# Patient Record
Sex: Female | Born: 1960 | Race: White | Hispanic: No | Marital: Married | Smoking: Current every day smoker
Health system: Southern US, Community
[De-identification: ages and names within clinical notes are randomized; demographics above are authoritative.]

## PROBLEM LIST (undated history)

## (undated) DIAGNOSIS — I639 Cerebral infarction, unspecified: Secondary | ICD-10-CM

## (undated) DIAGNOSIS — N200 Calculus of kidney: Secondary | ICD-10-CM

---

## 2001-08-31 HISTORY — PX: ABDOMINAL HYSTERECTOMY: SHX81

## 2005-12-06 ENCOUNTER — Emergency Department: Payer: Self-pay | Admitting: Emergency Medicine

## 2009-05-01 ENCOUNTER — Inpatient Hospital Stay: Payer: Self-pay | Admitting: *Deleted

## 2009-05-15 ENCOUNTER — Encounter: Payer: Self-pay | Admitting: Family Medicine

## 2009-05-31 ENCOUNTER — Encounter: Payer: Self-pay | Admitting: Family Medicine

## 2009-06-05 ENCOUNTER — Ambulatory Visit: Payer: Self-pay | Admitting: Neurology

## 2009-06-18 ENCOUNTER — Ambulatory Visit: Payer: Self-pay | Admitting: Neurology

## 2009-07-01 ENCOUNTER — Encounter: Payer: Self-pay | Admitting: Family Medicine

## 2009-07-05 ENCOUNTER — Emergency Department: Payer: Self-pay | Admitting: Internal Medicine

## 2011-09-30 LAB — TSH: TSH: 1.43 u[IU]/mL (ref <=5.90)

## 2012-04-19 LAB — BASIC METABOLIC PANEL
BUN: 20 mg/dL (ref 4–21)
CREATININE: 0.9 mg/dL (ref 0.5–1.1)
Glucose: 91 mg/dL
POTASSIUM: 4.4 mmol/L (ref 3.4–5.3)
Sodium: 142 mmol/L (ref 137–147)

## 2012-04-19 LAB — HEPATIC FUNCTION PANEL
ALT: 10 U/L (ref 7–35)
AST: 12 U/L — AB (ref 13–35)

## 2012-04-19 LAB — LIPID PANEL
CHOLESTEROL: 209 mg/dL — AB (ref 0–200)
HDL: 56 mg/dL (ref 35–70)
LDL Cholesterol: 137 mg/dL
Triglycerides: 80 mg/dL (ref 40–160)

## 2012-04-19 LAB — HEMOGLOBIN A1C: HEMOGLOBIN A1C: 5.8 % (ref 4.0–6.0)

## 2013-01-20 ENCOUNTER — Ambulatory Visit: Payer: Self-pay | Admitting: Family Medicine

## 2013-10-27 ENCOUNTER — Ambulatory Visit: Payer: Self-pay | Admitting: Family Medicine

## 2013-12-04 ENCOUNTER — Ambulatory Visit: Payer: Self-pay | Admitting: Urology

## 2015-02-08 DIAGNOSIS — Z87442 Personal history of urinary calculi: Secondary | ICD-10-CM | POA: Insufficient documentation

## 2015-02-08 DIAGNOSIS — I10 Essential (primary) hypertension: Secondary | ICD-10-CM | POA: Insufficient documentation

## 2015-02-08 DIAGNOSIS — I6930 Unspecified sequelae of cerebral infarction: Secondary | ICD-10-CM | POA: Insufficient documentation

## 2015-02-08 DIAGNOSIS — F329 Major depressive disorder, single episode, unspecified: Secondary | ICD-10-CM | POA: Insufficient documentation

## 2015-02-08 DIAGNOSIS — F32A Depression, unspecified: Secondary | ICD-10-CM | POA: Insufficient documentation

## 2015-02-08 DIAGNOSIS — J309 Allergic rhinitis, unspecified: Secondary | ICD-10-CM | POA: Insufficient documentation

## 2015-02-08 DIAGNOSIS — Q828 Other specified congenital malformations of skin: Secondary | ICD-10-CM | POA: Insufficient documentation

## 2015-02-08 DIAGNOSIS — M549 Dorsalgia, unspecified: Secondary | ICD-10-CM | POA: Insufficient documentation

## 2015-02-08 DIAGNOSIS — E7800 Pure hypercholesterolemia, unspecified: Secondary | ICD-10-CM | POA: Insufficient documentation

## 2015-02-08 DIAGNOSIS — N200 Calculus of kidney: Secondary | ICD-10-CM | POA: Insufficient documentation

## 2015-02-08 DIAGNOSIS — Z8669 Personal history of other diseases of the nervous system and sense organs: Secondary | ICD-10-CM | POA: Insufficient documentation

## 2015-02-08 DIAGNOSIS — G40909 Epilepsy, unspecified, not intractable, without status epilepticus: Secondary | ICD-10-CM | POA: Insufficient documentation

## 2015-02-08 DIAGNOSIS — Z8639 Personal history of other endocrine, nutritional and metabolic disease: Secondary | ICD-10-CM | POA: Insufficient documentation

## 2015-02-08 DIAGNOSIS — G4726 Circadian rhythm sleep disorder, shift work type: Secondary | ICD-10-CM | POA: Insufficient documentation

## 2015-02-08 DIAGNOSIS — Z720 Tobacco use: Secondary | ICD-10-CM | POA: Insufficient documentation

## 2015-02-11 ENCOUNTER — Encounter: Payer: Self-pay | Admitting: Emergency Medicine

## 2015-02-11 ENCOUNTER — Emergency Department
Admission: EM | Admit: 2015-02-11 | Discharge: 2015-02-11 | Disposition: A | Discharge status: Home / Self Care | Payer: PRIVATE HEALTH INSURANCE | Attending: Emergency Medicine | Admitting: Emergency Medicine

## 2015-02-11 ENCOUNTER — Other Ambulatory Visit: Payer: Self-pay

## 2015-02-11 ENCOUNTER — Telehealth: Payer: Self-pay | Admitting: Family Medicine

## 2015-02-11 ENCOUNTER — Emergency Department: Payer: PRIVATE HEALTH INSURANCE

## 2015-02-11 ENCOUNTER — Encounter: Payer: Self-pay | Admitting: Family Medicine

## 2015-02-11 ENCOUNTER — Ambulatory Visit (INDEPENDENT_AMBULATORY_CARE_PROVIDER_SITE_OTHER): Payer: PRIVATE HEALTH INSURANCE | Admitting: Family Medicine

## 2015-02-11 VITALS — BP 108/80 | HR 73 | Temp 98.4°F | Resp 16 | Ht 67.0 in | Wt 160.0 lb

## 2015-02-11 DIAGNOSIS — R059 Cough, unspecified: Secondary | ICD-10-CM

## 2015-02-11 DIAGNOSIS — E119 Type 2 diabetes mellitus without complications: Secondary | ICD-10-CM | POA: Insufficient documentation

## 2015-02-11 DIAGNOSIS — Z720 Tobacco use: Secondary | ICD-10-CM | POA: Insufficient documentation

## 2015-02-11 DIAGNOSIS — R05 Cough: Secondary | ICD-10-CM | POA: Diagnosis not present

## 2015-02-11 DIAGNOSIS — J189 Pneumonia, unspecified organism: Secondary | ICD-10-CM | POA: Diagnosis not present

## 2015-02-11 DIAGNOSIS — E860 Dehydration: Secondary | ICD-10-CM

## 2015-02-11 DIAGNOSIS — Z79899 Other long term (current) drug therapy: Secondary | ICD-10-CM | POA: Insufficient documentation

## 2015-02-11 DIAGNOSIS — M6281 Muscle weakness (generalized): Secondary | ICD-10-CM | POA: Insufficient documentation

## 2015-02-11 DIAGNOSIS — N390 Urinary tract infection, site not specified: Secondary | ICD-10-CM | POA: Insufficient documentation

## 2015-02-11 DIAGNOSIS — R1084 Generalized abdominal pain: Secondary | ICD-10-CM | POA: Diagnosis not present

## 2015-02-11 DIAGNOSIS — R079 Chest pain, unspecified: Secondary | ICD-10-CM | POA: Diagnosis present

## 2015-02-11 DIAGNOSIS — R509 Fever, unspecified: Secondary | ICD-10-CM

## 2015-02-11 DIAGNOSIS — Z791 Long term (current) use of non-steroidal anti-inflammatories (NSAID): Secondary | ICD-10-CM | POA: Diagnosis not present

## 2015-02-11 DIAGNOSIS — I10 Essential (primary) hypertension: Secondary | ICD-10-CM | POA: Insufficient documentation

## 2015-02-11 LAB — URINALYSIS COMPLETE WITH MICROSCOPIC (ARMC ONLY)
Bilirubin Urine: NEGATIVE
Glucose, UA: NEGATIVE mg/dL
HGB URINE DIPSTICK: NEGATIVE
NITRITE: NEGATIVE
PROTEIN: 30 mg/dL — AB
SPECIFIC GRAVITY, URINE: 1.038 — AB (ref 1.005–1.030)
pH: 5 (ref 5.0–8.0)

## 2015-02-11 LAB — BASIC METABOLIC PANEL
Anion gap: 9 (ref 5–15)
BUN: 21 mg/dL — ABNORMAL HIGH (ref 6–20)
CHLORIDE: 102 mmol/L (ref 101–111)
CO2: 29 mmol/L (ref 22–32)
Calcium: 9.4 mg/dL (ref 8.9–10.3)
Creatinine, Ser: 0.93 mg/dL (ref 0.44–1.00)
GFR calc Af Amer: >60 mL/min (ref >60)
GFR calc non Af Amer: >60 mL/min (ref >60)
Glucose, Bld: 115 mg/dL — ABNORMAL HIGH (ref 65–99)
Potassium: 3.8 mmol/L (ref 3.5–5.1)
SODIUM: 140 mmol/L (ref 135–145)

## 2015-02-11 LAB — POCT URINALYSIS DIPSTICK
Blood, UA: NEGATIVE
GLUCOSE UA: NEGATIVE
Nitrite, UA: NEGATIVE
Urobilinogen, UA: 0.2
pH, UA: 6

## 2015-02-11 LAB — CBC
HCT: 40.9 % (ref 35.0–47.0)
Hemoglobin: 13.5 g/dL (ref 12.0–16.0)
MCH: 34.2 pg — ABNORMAL HIGH (ref 26.0–34.0)
MCHC: 33 g/dL (ref 32.0–36.0)
MCV: 103.7 fL — ABNORMAL HIGH (ref 80.0–100.0)
PLATELETS: 203 10*3/uL (ref 150–440)
RBC: 3.94 MIL/uL (ref 3.80–5.20)
RDW: 12.9 % (ref 11.5–14.5)
WBC: 8 10*3/uL (ref 3.6–11.0)

## 2015-02-11 LAB — TROPONIN I: Troponin I: <0.03 ng/mL (ref <0.031)

## 2015-02-11 MED ORDER — AZITHROMYCIN 250 MG PO TABS: ORAL_TABLET | ORAL | Status: DC

## 2015-02-11 MED ORDER — CEPHALEXIN 500 MG PO CAPS: 500.0000 mg | ORAL_CAPSULE | Freq: Two times a day (BID) | ORAL | Status: DC

## 2015-02-11 MED ORDER — SODIUM CHLORIDE 0.9 % IV BOLUS (SEPSIS)
1000.0000 mL | INTRAVENOUS | Status: AC
  Administered 2015-02-11: 1000 mL via INTRAVENOUS

## 2015-02-11 NOTE — Progress Notes (Signed)
Patient: Kaitlyn Hodges Female    DOB: 1961/05/29   54 y.o.   MRN: [id number] Visit Date: 02/11/2015  Today's Provider: Mila Merry, MD   Chief Complaint  Patient presents with  . Fever   Subjective:    Fever  This is a recurrent problem. The current episode started 1 to 4 weeks ago (2 1/2 weeks). The problem occurs intermittently. The problem has been unchanged. The maximum temperature noted was 103 to 103.9 F. The temperature was taken using an oral thermometer. Associated symptoms include abdominal pain, chest pain, congestion, coughing, headaches, muscle aches, nausea, sleepiness, urinary pain, vomiting and wheezing. Pertinent negatives include no diarrhea, ear pain, rash or sore throat. She has tried acetaminophen (Tylenol Arthritis 300 mg) for the symptoms. The treatment provided mild relief.   She report having fevers off and on for a few months,but having daily for the last couple of weeks. She has been increasingly fatigued and weak and sometimes acts confused and out of it. She states she feels better today than yesterday. Her husband states he has tried to get her to go to the ER on several occasions and even called EMS to house a few times, but she refused to go to hospital for evaluation.     Previous Medications   ARMODAFINIL (NUVIGIL) 150 MG TABLET    Take 1 tablet by mouth daily.   LAMOTRIGINE (LAMICTAL) 100 MG TABLET    Take 1 tablet by mouth 2 (two) times daily.   LEVETIRACETAM 750 MG TB24    Take 2 tablets by mouth daily.   MELOXICAM (MOBIC) 7.5 MG TABLET    Take 1 tablet by mouth 2 (two) times daily.   METHOCARBAMOL (ROBAXIN) 750 MG TABLET    Take 1-2 tablets by mouth every 6 (six) hours as needed.   OXYCODONE-ACETAMINOPHEN (PERCOCET) 7.5-325 MG PER TABLET    Take 1 tablet by mouth every 6 (six) hours as needed.    Review of Systems  Constitutional: Positive for fever, chills and fatigue. Negative for activity change and appetite change.  HENT: Positive for congestion.  Negative for ear discharge, ear pain and sore throat.   Respiratory: Positive for cough, shortness of breath and wheezing.   Cardiovascular: Positive for chest pain.  Gastrointestinal: Positive for nausea, vomiting and abdominal pain. Negative for diarrhea.  Genitourinary: Positive for dysuria, flank pain and pelvic pain.  Musculoskeletal: Positive for back pain.  Skin: Negative for rash.  Neurological: Positive for headaches.    History  Substance Use Topics  . Smoking status: Current Every Day Smoker -- 0.50 packs/day    Types: Cigarettes  . Smokeless tobacco: Not on file  . Alcohol Use: No   Objective:   BP 108/80 mmHg  Pulse 73  Temp(Src) 98.4 F (36.9 C) (Oral)  Resp 16  Ht 5\' 7"  (1.702 m)  Wt 160 lb (72.576 kg)  BMI 25.05 kg/m2  SpO2 93%  LMP 02/10/2001  Physical Exam   General Appearance:    Awake, but somnolent, cooperative, no distress  Head:    Normocephalic, without obvious abnormality, atraumatic  Eyes:    PERRL, conjunctiva/corneas clear,   Ears:    Normal TM's and external ear canals, both ears  Nose:   Nares normal, septum midline, mucosa normal, no drainage   or sinus tenderness  Throat:   Lips, , and tongue normal; teeth and gums normal, dry mucous membranes  Neck:   Supple, symmetrical, trachea midline, no adenopathy;  thyroid:  No enlargement/tenderness/nodules; no carotid   bruit or JVD  Back:     Symmetric, no curvature, ROM normal, no CVA tenderness  Lungs:     Clear to auscultation bilaterally, respirations unlabored, diminished breath sounds  Chest wall:    No tenderness or deformity  Heart:    Regular rate and rhythm, S1 and S2 normal, no murmur, rub   or gallop  Abdomen:     Soft, diffusely tender, bowel sounds active all four quadrants,    no masses, no organomegaly  Extremities:   Extremities normal, atraumatic, no cyanosis or edema  Pulses:   2+ and symmetric all extremities  Skin:   Skin color, texture, no rashes or lesions  Lymph  nodes:   Tender anterior cervical lymph nodes  Neurologic:   CNII-XII intact. Normal strength, sensation and reflexes      throughout  MS: Diffuse tenderness of spine, chest wall and ribs.         Assessment & Plan:     1. Generalized abdominal pain  - POCT Urinalysis Dipstick - Comprehensive metabolic panel; Standing - DG Abd 2 Views; Future - Comprehensive metabolic panel - Urine culture  2. Fever, unspecified fever cause  - CBC; Standing - Sedimentation rate; Standing - Sedimentation rate - Urine culture  3. Cough  - DG Chest 2 View; Future - Urine culture  4. Dehydration  - Urine culture  Patient is very weak and nearly stumbles a few times. She refuses to go to the ER, but agreed to go to outpatient lab at [facility] so we can get results of tests today. Upon arrival to [facility] her husband reported that she nearly passed out after getting out of car, so he took her directly to the ER.

## 2015-02-11 NOTE — ED Notes (Signed)
Called into room re:  Patient vomiting.  Patient AAOx3  Skin warm and dry.  C/o a brief "stomach cramp" which resolved after vomiting.  Patient also states that vomiting caused by coughing.  Clear liquid seen in eme-bag.

## 2015-02-11 NOTE — Patient Instructions (Signed)
Please go to [facility] outpatient lab and radiology immediately for CBC, Met C, sed rate, Chest XR and abdominal XR

## 2015-02-11 NOTE — ED Notes (Signed)
To CT scan via stretcher.  AAOx3.  Skin warm and dry. 

## 2015-02-11 NOTE — ED Notes (Signed)
Pt reports that she has had mid sternal chest pain for a month, she had an appt with her MD that sent her to OP for xray and blood work. They sent her here from OP because she felt like she was going to pass out. She also states that her abd is hurting in her RLQ.

## 2015-02-11 NOTE — Telephone Encounter (Signed)
Pt husband, Page called states they went over to [facility] to have labs and an x-ray but pt almost passed out so he has her at the ER now.  Pt did not have labs or x-ray done.  VO#[phone]/[id number]

## 2015-02-11 NOTE — ED Provider Notes (Signed)
[facility] Emergency Department Provider Note  ____________________________________________  Time seen: Approximately 2:30 PM  I have reviewed the triage vital signs and the nursing notes.   HISTORY  Chief Complaint Chest Pain    HPI Twala Baune is a 54 y.o. female who presents with multiple complaints that she says have been going on for months.  More recently and specifically she has been complaining of a cough, general body aches, and generalized weakness.  She was sent over from her outpatient doctor because she felt like she was going to pass out.  She describes her cough is productive and intermittent and moderate.  At various times she says that she has had, over the last several weeks , episodes of vomiting, intermittent mild sharp chest pains, lower abdominal cramping, and dark urine.   History reviewed. No pertinent past medical history.  Patient Active Problem List   Diagnosis Date Noted  . Allergic rhinitis 02/08/2015  . Back pain 02/08/2015  . 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 Depression 02/08/2015  . Type II diabetes mellitus 02/08/2015  . History of kidney stones 02/08/2015  . History of sleep apnea 02/08/2015  . Hypercholesteremia 02/08/2015  . Hypertension 02/08/2015  . Late effect of stroke 02/08/2015  . Seizure disorder 02/08/2015  . 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 Tobacco abuse 02/08/2015    Past Surgical History  Procedure Laterality Date  . Abdominal hysterectomy  2003    Total; Polycystic ovarian Disease    Current Outpatient Rx  Name  Route  Sig  Dispense  Refill  . Armodafinil (NUVIGIL) 150 MG tablet   Oral   Take 1 tablet by mouth daily.         Marland Kitchen lamoTRIgine (LAMICTAL) 100 MG tablet   Oral   Take 1 tablet by mouth 2 (two) times daily.         . Levetiracetam 750 MG TB24   Oral   Take 2 tablets by mouth daily.         . meloxicam (MOBIC) 7.5 MG tablet   Oral   Take 1 tablet by mouth 2 (two) times  daily.         . methocarbamol (ROBAXIN) 750 MG tablet   Oral   Take 1-2 tablets by mouth every 6 (six) hours as needed.         Marland Kitchen oxyCODONE-acetaminophen (PERCOCET) 7.5-325 MG per tablet   Oral   Take 1 tablet by mouth every 6 (six) hours as needed.           Allergies Review of patient's allergies indicates no known allergies.  Family History  Problem Relation Age of Onset  . Breast cancer Mother   . Stroke Mother   . Raynaud syndrome Mother   . Hypertension Father   . Bone cancer Father   . Alcohol abuse Father   . Skin cancer Sister   . Ovarian cancer Maternal Grandmother   . Alcohol abuse Maternal Grandfather   . Skin cancer Paternal Grandmother   . Hypertension Paternal Grandmother   . Heart attack Paternal Grandfather   . Stroke Paternal Grandfather     Social History History  Substance Use Topics  . Smoking status: Current Every Day Smoker -- 0.50 packs/day    Types: Cigarettes  . Smokeless tobacco: Not on file  . Alcohol Use: No    Review of Systems Constitutional: Subjective fever and chills Eyes: No visual changes. ENT: No sore throat. Cardiovascular: Denies chest pain. Respiratory: Shortness  of breath, frequent productive cough Gastrointestinal: Intermittent abdominal cramping.  Intermittent nausea and vomiting.  No diarrhea.  No constipation. Genitourinary: Negative for dysuria. Musculoskeletal: Negative for back pain. Skin: Negative for rash. Neurological: Negative for headaches, focal weakness or numbness.  10-point ROS otherwise negative.  ____________________________________________   PHYSICAL EXAM:  VITAL SIGNS: ED Triage Vitals  Enc Vitals Group     BP 02/11/15 1112 111/76 mmHg     Pulse Rate 02/11/15 1112 70     Resp 02/11/15 1112 18     Temp 02/11/15 1112 98.7 F (37.1 C)     Temp Source 02/11/15 1112 Oral     SpO2 02/11/15 1112 97 %     Weight 02/11/15 1112 160 lb (72.576 kg)     Height 02/11/15 1112 5\' 7"  (1.702 m)      Head Cir --      Peak Flow --      Pain Score 02/11/15 1112 6     Pain Loc --      Pain Edu? --      Excl. in GC? --     Constitutional: Alert and oriented. Well appearing and in no acute distress. Eyes: Conjunctivae are normal. PERRL. EOMI. Head: Atraumatic. Nose: No congestion/rhinnorhea. Mouth/Throat: Mucous membranes are moist.  Oropharynx non-erythematous. Neck: No stridor.   Cardiovascular: Normal rate, regular rhythm. Grossly normal heart sounds.  Good peripheral circulation. Respiratory: Normal respiratory effort.  No retractions. Lungs CTAB. Gastrointestinal: Soft and nontender. No distention. No abdominal bruits. No CVA tenderness. Musculoskeletal: No lower extremity tenderness nor edema.  No joint effusions. Neurologic:  Normal speech and language. No gross focal neurologic deficits are appreciated. Speech is normal. No gait instability. Skin:  Skin is warm, dry and intact. No rash noted. Psychiatric: Mood and affect are normal. Speech and behavior are normal.  ____________________________________________   LABS (all labs ordered are listed, but only abnormal results are displayed)  Labs Reviewed  CBC - Abnormal; Notable for the following:    MCV 103.7 (*)    MCH 34.2 (*)    All other components within normal limits  BASIC METABOLIC PANEL - Abnormal; Notable for the following:    Glucose, Bld 115 (*)    BUN 21 (*)    All other components within normal limits  URINALYSIS COMPLETEWITH MICROSCOPIC ([facility] ONLY) - Abnormal; Notable for the following:    Color, Urine YELLOW (*)    APPearance CLEAR (*)    Ketones, ur TRACE (*)    Specific Gravity, Urine 1.038 (*)    Protein, ur 30 (*)    Leukocytes, UA TRACE (*)    Bacteria, UA RARE (*)    Squamous Epithelial / LPF 0-5 (*)    All other components within normal limits  TROPONIN I  Urine WBCs = 6-30 ____________________________________________  EKG  ED ECG REPORT I, Tymia Streb, the attending physician,  personally viewed and interpreted this ECG.  Date: 02/11/2015 EKG Time: 11:12 Rate: 67 Rhythm: normal sinus rhythm QRS Axis: normal Intervals: normal ST/T Wave abnormalities: normal Conduction Disutrbances: none Narrative Interpretation: unremarkable  ____________________________________________  RADIOLOGY  Ct Abdomen Pelvis Wo Contrast  02/11/2015   CLINICAL DATA:  Intermittent cough and fever for a few weeks. Abdominal cramping.  EXAM: CT ABDOMEN AND PELVIS WITHOUT CONTRAST  TECHNIQUE: Multidetector CT imaging of the abdomen and pelvis was performed following the standard protocol without IV contrast.  COMPARISON:  CT abdomen and pelvis 12/04/2013 single view of the chest this same day.  FINDINGS:  Patchy bibasilar airspace disease appears worse on the right and has an appearance worrisome for pneumonia. There is no pleural or pericardial effusion.  Thickening of the walls of the distal esophagus appears unchanged compared to the prior CT. There is food in the distal esophagus.  Splenic calcification is consistent with old granulomatous disease. The gallbladder, liver, pancreas and kidneys appear normal. Mild thickening of the adrenal glands bilaterally is unchanged and consistent with hyperplasia.  The patient is status post hysterectomy. The stomach, small and large bowel and appendix appear normal. No lymphadenopathy or fluid is identified.  No lytic or sclerotic bony lesion is seen. Lower lumbar facet arthropathy and degenerative change about the sacroiliac joints is noted.  IMPRESSION: Patchy basilar airspace disease is worse on the left and has an appearance worrisome for pneumonia, possibly atypical.  Thickening of the walls of the distal esophagus is stable in appearance and consistent with inflammatory change. Food in the distal esophagus could be due to reflux and/or poor motility.  No acute finding in the abdomen or pelvis.   Electronically Signed   By: Drusilla Kanner M.D.   On:  02/11/2015 16:04   Dg Chest 2 View  02/11/2015   CLINICAL DATA:  Chest pain  EXAM: CHEST  2 VIEW  COMPARISON:  05/02/2009  FINDINGS: Cardiomediastinal silhouette is stable. No acute infiltrate or pleural effusion. No pulmonary edema. Again noted accessory azygos fissure/ lobe. Osteopenia and mild degenerative changes thoracic spine.  IMPRESSION: No active cardiopulmonary disease.   Electronically Signed   By: Natasha Mead M.D.   On: 02/11/2015 12:41    ____________________________________________   INITIAL IMPRESSION / ASSESSMENT AND PLAN / ED COURSE  Pertinent labs & imaging results that were available during my care of the patient were reviewed by me and considered in my medical decision making (see chart for details).  Multiple various complaints, but workup is generally reassuring.  She has a mild urinary tract infection and possible atypical pneumonia based on her CT scan.  I will treat both with antibiotics and advised close outpatient follow-up.  I also gave her a liter of normal saline which made her feel better.  She understands the usual and customary return precautions.  ____________________________________________  FINAL CLINICAL IMPRESSION(S) / ED DIAGNOSES  Final diagnoses:  Atypical pneumonia  UTI (urinary tract infection), uncomplicated      NEW MEDICATIONS STARTED DURING THIS VISIT:  New Prescriptions   AZITHROMYCIN (ZITHROMAX) 250 MG TABLET    Take 2 tablets PO on day 1, then take 1 tablet PO daily for 4 more days   CEPHALEXIN (KEFLEX) 500 MG CAPSULE    Take 1 capsule (500 mg total) by mouth 2 (two) times daily.     Loleta Rose, MD 02/11/15 [phone]

## 2015-02-11 NOTE — Discharge Instructions (Signed)
Based on your workup today, it appears you are suffering from a mild urinary tract infection and possible mild pneumonia.  I have prescribed 2 antibiotics that should help with both conditions.  Please take the full course of both medications, drink plenty of clear fluids, and follow up with your primary care doctor at the next available opportunity.   Pneumonia Pneumonia is an infection of the lungs.  CAUSES Pneumonia may be caused by bacteria or a virus. Usually, these infections are caused by breathing infectious particles into the lungs (respiratory tract). SIGNS AND SYMPTOMS   Cough.  Fever.  Chest pain.  Increased rate of breathing.  Wheezing.  Mucus production. DIAGNOSIS  If you have the common symptoms of pneumonia, your health care provider will typically confirm the diagnosis with a chest X-ray. The X-ray will show an abnormality in the lung (pulmonary infiltrate) if you have pneumonia. Other tests of your blood, urine, or sputum may be done to find the specific cause of your pneumonia. Your health care provider may also do tests (blood gases or pulse oximetry) to see how well your lungs are working. TREATMENT  Some forms of pneumonia may be spread to other people when you cough or sneeze. You may be asked to wear a mask before and during your exam. Pneumonia that is caused by bacteria is treated with antibiotic medicine. Pneumonia that is caused by the influenza virus may be treated with an antiviral medicine. Most other viral infections must run their course. These infections will not respond to antibiotics.  HOME CARE INSTRUCTIONS   Cough suppressants may be used if you are losing too much rest. However, coughing protects you by clearing your lungs. You should avoid using cough suppressants if you can.  Your health care provider may have prescribed medicine if he or she thinks your pneumonia is caused by bacteria or influenza. Finish your medicine even if you start to feel  better.  Your health care provider may also prescribe an expectorant. This loosens the mucus to be coughed up.  Take medicines only as directed by your health care provider.  Do not smoke. Smoking is a common cause of bronchitis and can contribute to pneumonia. If you are a smoker and continue to smoke, your cough may last several weeks after your pneumonia has cleared.  A cold steam vaporizer or humidifier in your room or home may help loosen mucus.  Coughing is often worse at night. Sleeping in a semi-upright position in a recliner or using a couple pillows under your head will help with this.  Get rest as you feel it is needed. Your body will usually let you know when you need to rest. PREVENTION A pneumococcal shot (vaccine) is available to prevent a common bacterial cause of pneumonia. This is usually suggested for:  People over 42 years old.  Patients on chemotherapy.  People with chronic lung problems, such as bronchitis or emphysema.  People with immune system problems. If you are over 65 or have a high risk condition, you may receive the pneumococcal vaccine if you have not received it before. In some countries, a routine influenza vaccine is also recommended. This vaccine can help prevent some cases of pneumonia.You may be offered the influenza vaccine as part of your care. If you smoke, it is time to quit. You may receive instructions on how to stop smoking. Your health care provider can provide medicines and counseling to help you quit. SEEK MEDICAL CARE IF: You have a fever.  SEEK IMMEDIATE MEDICAL CARE IF:   Your illness becomes worse. This is especially true if you are elderly or weakened from any other disease.  You cannot control your cough with suppressants and are losing sleep.  You begin coughing up blood.  You develop pain which is getting worse or is uncontrolled with medicines.  Any of the symptoms which initially brought you in for treatment are getting  worse rather than better.  You develop shortness of breath or chest pain. MAKE SURE YOU:   Understand these instructions.  Will watch your condition.  Will get help right away if you are not doing well or get worse. Document Released: 08/17/2005 Document Revised: 01/01/2014 Document Reviewed: 11/06/2010 [facility] Patient Information 2015 [place], [place]. This information is not intended to replace advice given to you by your health care provider. Make sure you discuss any questions you have with your health care provider.  Urinary Tract Infection Urinary tract infections (UTIs) can develop anywhere along your urinary tract. Your urinary tract is your body's drainage system for removing wastes and extra water. Your urinary tract includes two kidneys, two ureters, a bladder, and a urethra. Your kidneys are a pair of bean-shaped organs. Each kidney is about the size of your fist. They are located below your ribs, one on each side of your spine. CAUSES Infections are caused by microbes, which are microscopic organisms, including fungi, viruses, and bacteria. These organisms are so small that they can only be seen through a microscope. Bacteria are the microbes that most commonly cause UTIs. SYMPTOMS  Symptoms of UTIs may vary by age and gender of the patient and by the location of the infection. Symptoms in young women typically include a frequent and intense urge to urinate and a painful, burning feeling in the bladder or urethra during urination. Older women and men are more likely to be tired, shaky, and weak and have muscle aches and abdominal pain. A fever may mean the infection is in your kidneys. Other symptoms of a kidney infection include pain in your back or sides below the ribs, nausea, and vomiting. DIAGNOSIS To diagnose a UTI, your caregiver will ask you about your symptoms. Your caregiver also will ask to provide a urine sample. The urine sample will be tested for bacteria and white  blood cells. White blood cells are made by your body to help fight infection. TREATMENT  Typically, UTIs can be treated with medication. Because most UTIs are caused by a bacterial infection, they usually can be treated with the use of antibiotics. The choice of antibiotic and length of treatment depend on your symptoms and the type of bacteria causing your infection. HOME CARE INSTRUCTIONS  If you were prescribed antibiotics, take them exactly as your caregiver instructs you. Finish the medication even if you feel better after you have only taken some of the medication.  Drink enough water and fluids to keep your urine clear or pale yellow.  Avoid caffeine, tea, and carbonated beverages. They tend to irritate your bladder.  Empty your bladder often. Avoid holding urine for long periods of time.  Empty your bladder before and after sexual intercourse.  After a bowel movement, women should cleanse from front to back. Use each tissue only once. SEEK MEDICAL CARE IF:   You have back pain.  You develop a fever.  Your symptoms do not begin to resolve within 3 days. SEEK IMMEDIATE MEDICAL CARE IF:   You have severe back pain or lower abdominal pain.  You develop chills.  You have nausea or vomiting.  You have continued burning or discomfort with urination. MAKE SURE YOU:   Understand these instructions.  Will watch your condition.  Will get help right away if you are not doing well or get worse. Document Released: 05/27/2005 Document Revised: 02/16/2012 Document Reviewed: 09/25/2011 [facility] Patient Information 2015 [place], [place]. This information is not intended to replace advice given to you by your health care provider. Make sure you discuss any questions you have with your health care provider.

## 2015-02-12 LAB — URINE CULTURE: ORGANISM ID, BACTERIA: NO GROWTH

## 2015-03-11 ENCOUNTER — Ambulatory Visit: Payer: PRIVATE HEALTH INSURANCE | Admitting: Family Medicine

## 2015-03-16 ENCOUNTER — Emergency Department
Admission: EM | Admit: 2015-03-16 | Discharge: 2015-03-16 | Disposition: A | Discharge status: Home / Self Care | Payer: No Typology Code available for payment source | Attending: Emergency Medicine | Admitting: Emergency Medicine

## 2015-03-16 ENCOUNTER — Emergency Department: Payer: No Typology Code available for payment source

## 2015-03-16 ENCOUNTER — Encounter: Payer: Self-pay | Admitting: Emergency Medicine

## 2015-03-16 DIAGNOSIS — K2100 Gastro-esophageal reflux disease with esophagitis, without bleeding: Secondary | ICD-10-CM

## 2015-03-16 DIAGNOSIS — M791 Myalgia, unspecified site: Secondary | ICD-10-CM

## 2015-03-16 DIAGNOSIS — Z79899 Other long term (current) drug therapy: Secondary | ICD-10-CM | POA: Insufficient documentation

## 2015-03-16 DIAGNOSIS — I10 Essential (primary) hypertension: Secondary | ICD-10-CM | POA: Diagnosis not present

## 2015-03-16 DIAGNOSIS — Z792 Long term (current) use of antibiotics: Secondary | ICD-10-CM | POA: Diagnosis not present

## 2015-03-16 DIAGNOSIS — E119 Type 2 diabetes mellitus without complications: Secondary | ICD-10-CM | POA: Diagnosis not present

## 2015-03-16 DIAGNOSIS — R05 Cough: Secondary | ICD-10-CM | POA: Diagnosis present

## 2015-03-16 DIAGNOSIS — Z720 Tobacco use: Secondary | ICD-10-CM | POA: Insufficient documentation

## 2015-03-16 LAB — CBC WITH DIFFERENTIAL/PLATELET
BASOS ABS: 0.1 10*3/uL (ref 0–0.1)
Basophils Relative: 1 %
EOS PCT: 0 %
Eosinophils Absolute: 0 10*3/uL (ref 0–0.7)
HCT: 39.2 % (ref 35.0–47.0)
Hemoglobin: 13 g/dL (ref 12.0–16.0)
LYMPHS ABS: 1.1 10*3/uL (ref 1.0–3.6)
LYMPHS PCT: 9 %
MCH: 33 pg (ref 26.0–34.0)
MCHC: 33 g/dL (ref 32.0–36.0)
MCV: 99.9 fL (ref 80.0–100.0)
Monocytes Absolute: 0.6 10*3/uL (ref 0.2–0.9)
Monocytes Relative: 5 %
NEUTROS ABS: 9.9 10*3/uL — AB (ref 1.4–6.5)
NEUTROS PCT: 85 %
PLATELETS: 175 10*3/uL (ref 150–440)
RBC: 3.93 MIL/uL (ref 3.80–5.20)
RDW: 13.7 % (ref 11.5–14.5)
WBC: 11.6 10*3/uL — AB (ref 3.6–11.0)

## 2015-03-16 LAB — BASIC METABOLIC PANEL
Anion gap: 10 (ref 5–15)
BUN: 13 mg/dL (ref 6–20)
CHLORIDE: 103 mmol/L (ref 101–111)
CO2: 25 mmol/L (ref 22–32)
Calcium: 9 mg/dL (ref 8.9–10.3)
Creatinine, Ser: 0.84 mg/dL (ref 0.44–1.00)
GFR calc Af Amer: >60 mL/min (ref >60)
GFR calc non Af Amer: >60 mL/min (ref >60)
GLUCOSE: 79 mg/dL (ref 65–99)
Potassium: 3.8 mmol/L (ref 3.5–5.1)
SODIUM: 138 mmol/L (ref 135–145)

## 2015-03-16 LAB — URINALYSIS COMPLETE WITH MICROSCOPIC (ARMC ONLY)
BACTERIA UA: NONE SEEN
Bilirubin Urine: NEGATIVE
Glucose, UA: NEGATIVE mg/dL
HGB URINE DIPSTICK: NEGATIVE
Ketones, ur: NEGATIVE mg/dL
NITRITE: NEGATIVE
Protein, ur: NEGATIVE mg/dL
SPECIFIC GRAVITY, URINE: 1.023 (ref 1.005–1.030)
pH: 5 (ref 5.0–8.0)

## 2015-03-16 MED ORDER — DEXTROSE 5 % AND 0.9 % NACL IV BOLUS
1000.0000 mL | Freq: Once | INTRAVENOUS | Status: AC
  Administered 2015-03-16: 1000 mL via INTRAVENOUS
  Filled 2015-03-16: qty 1000

## 2015-03-16 MED ORDER — RANITIDINE HCL 150 MG PO CAPS: 150.0000 mg | ORAL_CAPSULE | Freq: Two times a day (BID) | ORAL | Status: DC

## 2015-03-16 MED ORDER — ONDANSETRON 8 MG PO TBDP: 8.0000 mg | ORAL_TABLET | Freq: Three times a day (TID) | ORAL | Status: DC | PRN

## 2015-03-16 MED ORDER — KETOROLAC TROMETHAMINE 30 MG/ML IJ SOLN
30.0000 mg | Freq: Once | INTRAMUSCULAR | Status: AC
  Administered 2015-03-16: 30 mg via INTRAVENOUS
  Filled 2015-03-16: qty 1

## 2015-03-16 MED ORDER — IOHEXOL 240 MG/ML SOLN
50.0000 mL | Freq: Once | INTRAMUSCULAR | Status: AC | PRN
  Administered 2015-03-16: 50 mL via ORAL

## 2015-03-16 MED ORDER — HYDROMORPHONE HCL 1 MG/ML IJ SOLN
1.0000 mg | INTRAMUSCULAR | Status: AC
  Administered 2015-03-16: 1 mg via INTRAVENOUS
  Filled 2015-03-16: qty 1

## 2015-03-16 MED ORDER — SODIUM CHLORIDE 0.9 % IV BOLUS (SEPSIS)
1000.0000 mL | Freq: Once | INTRAVENOUS | Status: AC
  Administered 2015-03-16: 1000 mL via INTRAVENOUS

## 2015-03-16 MED ORDER — SUCRALFATE 1 G PO TABS: 1.0000 g | ORAL_TABLET | Freq: Four times a day (QID) | ORAL | Status: DC

## 2015-03-16 MED ORDER — ONDANSETRON HCL 4 MG/2ML IJ SOLN
4.0000 mg | Freq: Once | INTRAMUSCULAR | Status: AC
  Administered 2015-03-16: 4 mg via INTRAVENOUS
  Filled 2015-03-16: qty 2

## 2015-03-16 MED ORDER — IOHEXOL 300 MG/ML  SOLN
100.0000 mL | Freq: Once | INTRAMUSCULAR | Status: AC | PRN
  Administered 2015-03-16: 100 mL via INTRAVENOUS

## 2015-03-16 MED ORDER — NAPROXEN 250 MG PO TABS: 250.0000 mg | ORAL_TABLET | Freq: Two times a day (BID) | ORAL | Status: DC

## 2015-03-16 NOTE — ED Notes (Signed)
mischarted U/A. Urinalysis still needs to be completed. Lab is aware.

## 2015-03-16 NOTE — Discharge Instructions (Signed)
Gastroesophageal Reflux Disease, Adult Gastroesophageal reflux disease (GERD) happens when acid from your stomach flows up into the esophagus. When acid comes in contact with the esophagus, the acid causes soreness (inflammation) in the esophagus. Over time, GERD may create small holes (ulcers) in the lining of the esophagus. CAUSES   Increased body weight. This puts pressure on the stomach, making acid rise from the stomach into the esophagus.  Smoking. This increases acid production in the stomach.  Drinking alcohol. This causes decreased pressure in the lower esophageal sphincter (valve or ring of muscle between the esophagus and stomach), allowing acid from the stomach into the esophagus.  Late evening meals and a full stomach. This increases pressure and acid production in the stomach.  A malformed lower esophageal sphincter. Sometimes, no cause is found. SYMPTOMS   Burning pain in the lower part of the mid-chest behind the breastbone and in the mid-stomach area. This may occur twice a week or more often.  Trouble swallowing.  Sore throat.  Dry cough.  Asthma-like symptoms including chest tightness, shortness of breath, or wheezing. DIAGNOSIS  Your caregiver may be able to diagnose GERD based on your symptoms. In some cases, X-rays and other tests may be done to check for complications or to check the condition of your stomach and esophagus. TREATMENT  Your caregiver may recommend over-the-counter or prescription medicines to help decrease acid production. Ask your caregiver before starting or adding any new medicines.  HOME CARE INSTRUCTIONS   Change the factors that you can control. Ask your caregiver for guidance concerning weight loss, quitting smoking, and alcohol consumption.  Avoid foods and drinks that make your symptoms worse, such as:  Caffeine or alcoholic drinks.  Chocolate.  Peppermint or mint flavorings.  Garlic and onions.  Spicy foods.  Citrus fruits,  such as oranges, lemons, or limes.  Tomato-based foods such as sauce, chili, salsa, and pizza.  Fried and fatty foods.  Avoid lying down for the 3 hours prior to your bedtime or prior to taking a nap.  Eat small, frequent meals instead of large meals.  Wear loose-fitting clothing. Do not wear anything tight around your waist that causes pressure on your stomach.  Raise the head of your bed 6 to 8 inches with wood blocks to help you sleep. Extra pillows will not help.  Only take over-the-counter or prescription medicines for pain, discomfort, or fever as directed by your caregiver.  Do not take aspirin, ibuprofen, or other nonsteroidal anti-inflammatory drugs (NSAIDs). SEEK IMMEDIATE MEDICAL CARE IF:   You have pain in your arms, neck, jaw, teeth, or back.  Your pain increases or changes in intensity or duration.  You develop nausea, vomiting, or sweating (diaphoresis).  You develop shortness of breath, or you faint.  Your vomit is green, yellow, black, or looks like coffee grounds or blood.  Your stool is red, bloody, or black. These symptoms could be signs of other problems, such as heart disease, gastric bleeding, or esophageal bleeding. MAKE SURE YOU:   Understand these instructions.  Will watch your condition.  Will get help right away if you are not doing well or get worse. Document Released: 05/27/2005 Document Revised: 11/09/2011 Document Reviewed: 03/06/2011 [facility] Patient Information 2015 [place], [place]. This information is not intended to replace advice given to you by your health care provider. Make sure you discuss any questions you have with your health care provider.  Musculoskeletal Pain Musculoskeletal pain is muscle and boney aches and pains. These pains can occur  in any part of the body. Your caregiver may treat you without knowing the cause of the pain. They may treat you if blood or urine tests, X-rays, and other tests were normal.  CAUSES There is  often not a definite cause or reason for these pains. These pains may be caused by a type of germ (virus). The discomfort may also come from overuse. Overuse includes working out too hard when your body is not fit. Boney aches also come from weather changes. Bone is sensitive to atmospheric pressure changes. HOME CARE INSTRUCTIONS   Ask when your test results will be ready. Make sure you get your test results.  Only take over-the-counter or prescription medicines for pain, discomfort, or fever as directed by your caregiver. If you were given medications for your condition, do not drive, operate machinery or power tools, or sign legal documents for 24 hours. Do not drink alcohol. Do not take sleeping pills or other medications that may interfere with treatment.  Continue all activities unless the activities cause more pain. When the pain lessens, slowly resume normal activities. Gradually increase the intensity and duration of the activities or exercise.  During periods of severe pain, bed rest may be helpful. Lay or sit in any position that is comfortable.  Putting ice on the injured area.  Put ice in a bag.  Place a towel between your skin and the bag.  Leave the ice on for 15 to 20 minutes, 3 to 4 times a day.  Follow up with your caregiver for continued problems and no reason can be found for the pain. If the pain becomes worse or does not go away, it may be necessary to repeat tests or do additional testing. Your caregiver may need to look further for a possible cause. SEEK IMMEDIATE MEDICAL CARE IF:  You have pain that is getting worse and is not relieved by medications.  You develop chest pain that is associated with shortness or breath, sweating, feeling sick to your stomach (nauseous), or throw up (vomit).  Your pain becomes localized to the abdomen.  You develop any new symptoms that seem different or that concern you. MAKE SURE YOU:   Understand these instructions.  Will  watch your condition.  Will get help right away if you are not doing well or get worse. Document Released: 08/17/2005 Document Revised: 11/09/2011 Document Reviewed: 04/21/2013 [facility] Patient Information 2015 [place]. This information is not intended to replace advice given to you by your health care provider. Make sure you discuss any questions you have with your health care provider.

## 2015-03-16 NOTE — ED Notes (Signed)
CT notified of patients status.

## 2015-03-16 NOTE — ED Notes (Signed)
Patient reports symptoms of cough, congestion, fever and generalized weakness for about a week. [facility]. Reports treatment within the last month for Pneumonia.

## 2015-03-16 NOTE — ED Provider Notes (Signed)
[facility] Emergency Department Provider Note  ____________________________________________  Time seen: 1210 PM  I have reviewed the triage vital signs and the nursing notes.   HISTORY  Chief Complaint Cough; Fever; and Generalized Body Aches    HPI Kaitlyn Hodges is a 54 y.o. female who complains of productive cough congestion fever or generalized weakness for 7-10 days. She reports that she had similar symptoms about a month ago and was treated for pneumonia and urinary tract infection and improved. She felt good for about a week, and then her symptoms reoccurred. She reports not being a eat or drink due to nausea for the past 2 days, but no vomiting or diarrhea. She has a history of TAH/BSO, but no other abdominal surgery.  She has central chest pain that hurts when she breathes or moves around. She feels short of breath which gets worse when she walks. No orthopnea or PND. No exertional chest pain.  History reviewed. No pertinent past medical history.  Patient Active Problem List   Diagnosis Date Noted  . Allergic rhinitis 02/08/2015  . Back pain 02/08/2015  . 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 Depression 02/08/2015  . Type II diabetes mellitus 02/08/2015  . History of kidney stones 02/08/2015  . History of sleep apnea 02/08/2015  . Hypercholesteremia 02/08/2015  . Hypertension 02/08/2015  . Late effect of stroke 02/08/2015  . Seizure disorder 02/08/2015  . 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 Tobacco abuse 02/08/2015    Past Surgical History  Procedure Laterality Date  . Abdominal hysterectomy  2003    Total; Polycystic ovarian Disease    Current Outpatient Rx  Name  Route  Sig  Dispense  Refill  . acetaminophen (TYLENOL ARTHRITIS PAIN) 650 MG CR tablet   Oral   Take 1,300 mg by mouth every 8 (eight) hours.         Marland Kitchen azithromycin (ZITHROMAX) 250 MG tablet      Take 2 tablets PO on day 1, then take 1 tablet PO daily for 4 more days   6  each   0   . cephALEXin (KEFLEX) 500 MG capsule   Oral   Take 1 capsule (500 mg total) by mouth 2 (two) times daily.   14 capsule   0   . naproxen (NAPROSYN) 250 MG tablet   Oral   Take 1 tablet (250 mg total) by mouth 2 (two) times daily with a meal.   40 tablet   0   . ondansetron (ZOFRAN ODT) 8 MG disintegrating tablet   Oral   Take 1 tablet (8 mg total) by mouth every 8 (eight) hours as needed for nausea or vomiting.   20 tablet   0   . ranitidine (ZANTAC) 150 MG capsule   Oral   Take 1 capsule (150 mg total) by mouth 2 (two) times daily.   28 capsule   0   . sucralfate (CARAFATE) 1 G tablet   Oral   Take 1 tablet (1 g total) by mouth 4 (four) times daily.   120 tablet   1     Allergies Review of patient's allergies indicates no known allergies.  Family History  Problem Relation Age of Onset  . Breast cancer Mother   . Stroke Mother   . Raynaud syndrome Mother   . Hypertension Father   . Bone cancer Father   . Alcohol abuse Father   . Skin cancer Sister   . Ovarian cancer Maternal Grandmother   .  Alcohol abuse Maternal Grandfather   . Skin cancer Paternal Grandmother   . Hypertension Paternal Grandmother   . Heart attack Paternal Grandfather   . Stroke Paternal Grandfather     Social History History  Substance Use Topics  . Smoking status: Current Every Day Smoker -- 0.50 packs/day    Types: Cigarettes  . Smokeless tobacco: Not on file  . Alcohol Use: No    Review of Systems  Constitutional: Positive fever and chills, temp of 102 at home.. No weight changes Eyes:No blurry vision or double vision.  ENT: No sore throat. Cardiovascular: Positive chest pain as above. Respiratory: Positive cough and dyspnea as above. Gastrointestinal: Negative for abdominal pain, vomiting and diarrhea.  No BRBPR or melena. Genitourinary: Negative for dysuria, urinary retention, bloody urine, or difficulty urinating. Musculoskeletal: Diffuse joint pain and  myalgias. Skin: Negative for rash. Neurological: Negative for headaches, focal weakness or numbness. Psychiatric:No anxiety or depression.   Endocrine:No hot/cold intolerance, changes in energy, or sleep difficulty.  10-point ROS otherwise negative.  ____________________________________________   PHYSICAL EXAM:  VITAL SIGNS: ED Triage Vitals  Enc Vitals Group     BP 03/16/15 1204 118/70 mmHg     Pulse Rate 03/16/15 1204 87     Resp 03/16/15 1204 20     Temp 03/16/15 1204 99.4 F (37.4 C)     Temp Source 03/16/15 1204 Oral     SpO2 03/16/15 1204 99 %     Weight 03/16/15 1204 164 lb (74.39 kg)     Height 03/16/15 1204 5\' 7"  (1.702 m)     Head Cir --      Peak Flow --      Pain Score 03/16/15 1204 5     Pain Loc --      Pain Edu? --      Excl. in GC? --      Constitutional: Alert and oriented. Mild distress. Nontoxic Eyes: No scleral icterus. No conjunctival pallor. PERRL. EOMI ENT   Head: Normocephalic and atraumatic.   Nose: No congestion/rhinnorhea. No septal hematoma   Mouth/Throat: Dry mucous membranes, mild pharyngeal erythema. No peritonsillar mass. No uvula shift.   Neck: No stridor. No SubQ emphysema. No meningismus. Hematological/Lymphatic/Immunilogical: No cervical lymphadenopathy. Cardiovascular: RRR. Normal and symmetric distal pulses are present in all extremities. No murmurs, rubs, or gallops. Respiratory: Normal respiratory effort without tachypnea nor retractions. Breath sounds are clear and equal bilaterally. No wheezes/rales/rhonchi. Gastrointestinal: Soft with right-sided tenderness. No distention. There is no CVA tenderness.  No rebound, rigidity, or guarding. Genitourinary: deferred Musculoskeletal: Nontender with normal range of motion in all extremities. No joint effusions.  No lower extremity tenderness.  No edema. Neurologic:   Normal speech and language.  CN 2-10 normal. Motor grossly intact. No pronator drift.  Normal gait. No  gross focal neurologic deficits are appreciated.  Skin:  Skin is warm, dry and intact. No rash noted.  No petechiae, purpura, or bullae. Psychiatric: Mood and affect are normal. Speech and behavior are normal. Patient exhibits appropriate insight and judgment.  ____________________________________________    LABS (pertinent positives/negatives) (all labs ordered are listed, but only abnormal results are displayed) Labs Reviewed  CBC WITH DIFFERENTIAL/PLATELET - Abnormal; Notable for the following:    WBC 11.6 (*)    Neutro Abs 9.9 (*)    All other components within normal limits  URINALYSIS COMPLETEWITH MICROSCOPIC ([facility] ONLY) - Abnormal; Notable for the following:    Color, Urine YELLOW (*)    APPearance HAZY (*)  Leukocytes, UA 1+ (*)    Squamous Epithelial / LPF 0-5 (*)    All other components within normal limits  BASIC METABOLIC PANEL   ____________________________________________   EKG    ____________________________________________    RADIOLOGY  Chest x-ray unremarkable CT abdomen and pelvis unremarkable, does show evidence of GERD and acid reflux.  ____________________________________________   PROCEDURES  ____________________________________________   INITIAL IMPRESSION / ASSESSMENT AND PLAN / ED COURSE  Pertinent labs & imaging results that were available during my care of the patient were reviewed by me and considered in my medical decision making (see chart for details).  Possible recurrence of pneumonia or urinary tract infection. We will evaluate these possibly spurious will checking labs and treating symptomatically with D5 NS and Toradol and Zofran. If workup is not revealing, will proceed with CT scan of the abdomen pelvis for possible appendicitis or other occult pathology. ----------------------------------------- 8:57 PM on 03/16/2015 -----------------------------------------  Initial workup unremarkable. Patient is tolerating oral intake.  CT scan unremarkable except for evidence of GERD. Discussed with patient who is comfortable with treating with antacids and symptomatic relief and expecting the symptoms to improve over the next few days. She'll return if her condition worsens. No evidence of sepsis at this time. Low suspicion for appendicitis perforation soft tissue infection or other severe pathology. ____________________________________________   FINAL CLINICAL IMPRESSION(S) / ED DIAGNOSES  Final diagnoses:  Myalgia  Gastroesophageal reflux disease with esophagitis      Sharman Cheek, MD 03/16/15 2057

## 2015-11-19 ENCOUNTER — Encounter: Payer: Self-pay | Admitting: Family Medicine

## 2015-11-19 ENCOUNTER — Ambulatory Visit (INDEPENDENT_AMBULATORY_CARE_PROVIDER_SITE_OTHER): Payer: PRIVATE HEALTH INSURANCE | Admitting: Family Medicine

## 2015-11-19 VITALS — BP 112/72 | HR 76 | Temp 98.1°F | Resp 16 | Wt 159.0 lb

## 2015-11-19 DIAGNOSIS — K1120 Sialoadenitis, unspecified: Secondary | ICD-10-CM | POA: Diagnosis not present

## 2015-11-19 MED ORDER — AMOXICILLIN-POT CLAVULANATE 875-125 MG PO TABS: 1.0000 | ORAL_TABLET | Freq: Two times a day (BID) | ORAL | Status: AC

## 2015-11-19 MED ORDER — HYDROCODONE-ACETAMINOPHEN 7.5-325 MG PO TABS: 1.0000 | ORAL_TABLET | Freq: Four times a day (QID) | ORAL | Status: AC | PRN

## 2015-11-19 NOTE — Patient Instructions (Signed)
2 Aleve every eight hours

## 2015-11-19 NOTE — Progress Notes (Addendum)
       Patient: Kaitlyn BaloMyra Dolinski Female    DOB: 1960/09/14   55 y.o.   MRN: [id number] Visit Date: 11/19/2015  Today's Provider: Mila Merryonald Matheson Vandehei, MD   Chief Complaint  Patient presents with  . Jaw Pain   Subjective:    HPI  Jaw pain: Patient comes in today with left side jaw pain and swelling. She reports symptoms started last night.  Patient states it hurts to touch her jaw. Patient denies and recent injury to the face or jaw. Pain and swelling has worsened since onset. Patient has tried Tylenol,  applying cold compresses and warm compresses with no relief. Had not been ill in any way before yesterday     No Known Allergies Previous Medications   ACETAMINOPHEN (TYLENOL ARTHRITIS PAIN) 650 MG CR TABLET    Take 1,300 mg by mouth every 8 (eight) hours.    Review of Systems  Constitutional: Negative for fever, chills, appetite change and fatigue.  HENT: Positive for facial swelling (left side of face) and trouble swallowing.   Respiratory: Negative for chest tightness and shortness of breath.   Cardiovascular: Negative for chest pain and palpitations.  Gastrointestinal: Positive for nausea. Negative for vomiting and abdominal pain.  Neurological: Negative for dizziness and weakness.    Social History  Substance Use Topics  . Smoking status: Current Every Day Smoker -- 0.50 packs/day    Types: Cigarettes  . Smokeless tobacco: Not on file  . Alcohol Use: No   Objective:   BP 112/72 mmHg  Pulse 76  Temp(Src) 98.1 F (36.7 C) (Oral)  Resp 16  Wt 159 lb (72.122 kg)  LMP 02/10/2001  Physical Exam   General Appearance:    Alert, cooperative, no distress  Eyes:    PERRL, conjunctiva/corneas clear, EOM's intact       ENT:   Moderate swelling and tenderness around left submandibular/sublingual glands. Slight erythema. No lesion of gums or buccal mucosa in the vicinity.           Assessment & Plan:     1. Sialoadenitis  - amoxicillin-clavulanate (AUGMENTIN) 875-125 MG tablet;  Take 1 tablet by mouth 2 (two) times daily.  Dispense: 20 tablet; Refill: 0 - HYDROcodone-acetaminophen (NORCO) 7.5-325 MG tablet; Take 1 tablet by mouth every 6 (six) hours as needed for moderate pain.  Dispense: 12 tablet; Refill: 0  Encouraged to drink plenty of fluids, tart OTC Aleve, 2 tablets every eight hours. Hard sour candies after starting on Aleve and antibiotic. Call if not improving within 24 hours.        Mila Merryonald Yarden Manuelito, MD  [facility]

## 2016-01-12 IMAGING — CR DG ABDOMEN 1V
1 series · 2 of 2 positions shown · non-contrast
Comparison: DG ABDOMEN 1V dated 01/20/2013

CLINICAL DATA: Renal stones.

EXAM:
ABDOMEN - 1 VIEW

[Series 1: supine kub · 0.17mm/px · 2 of 2 slices shown]
[im 1/2]
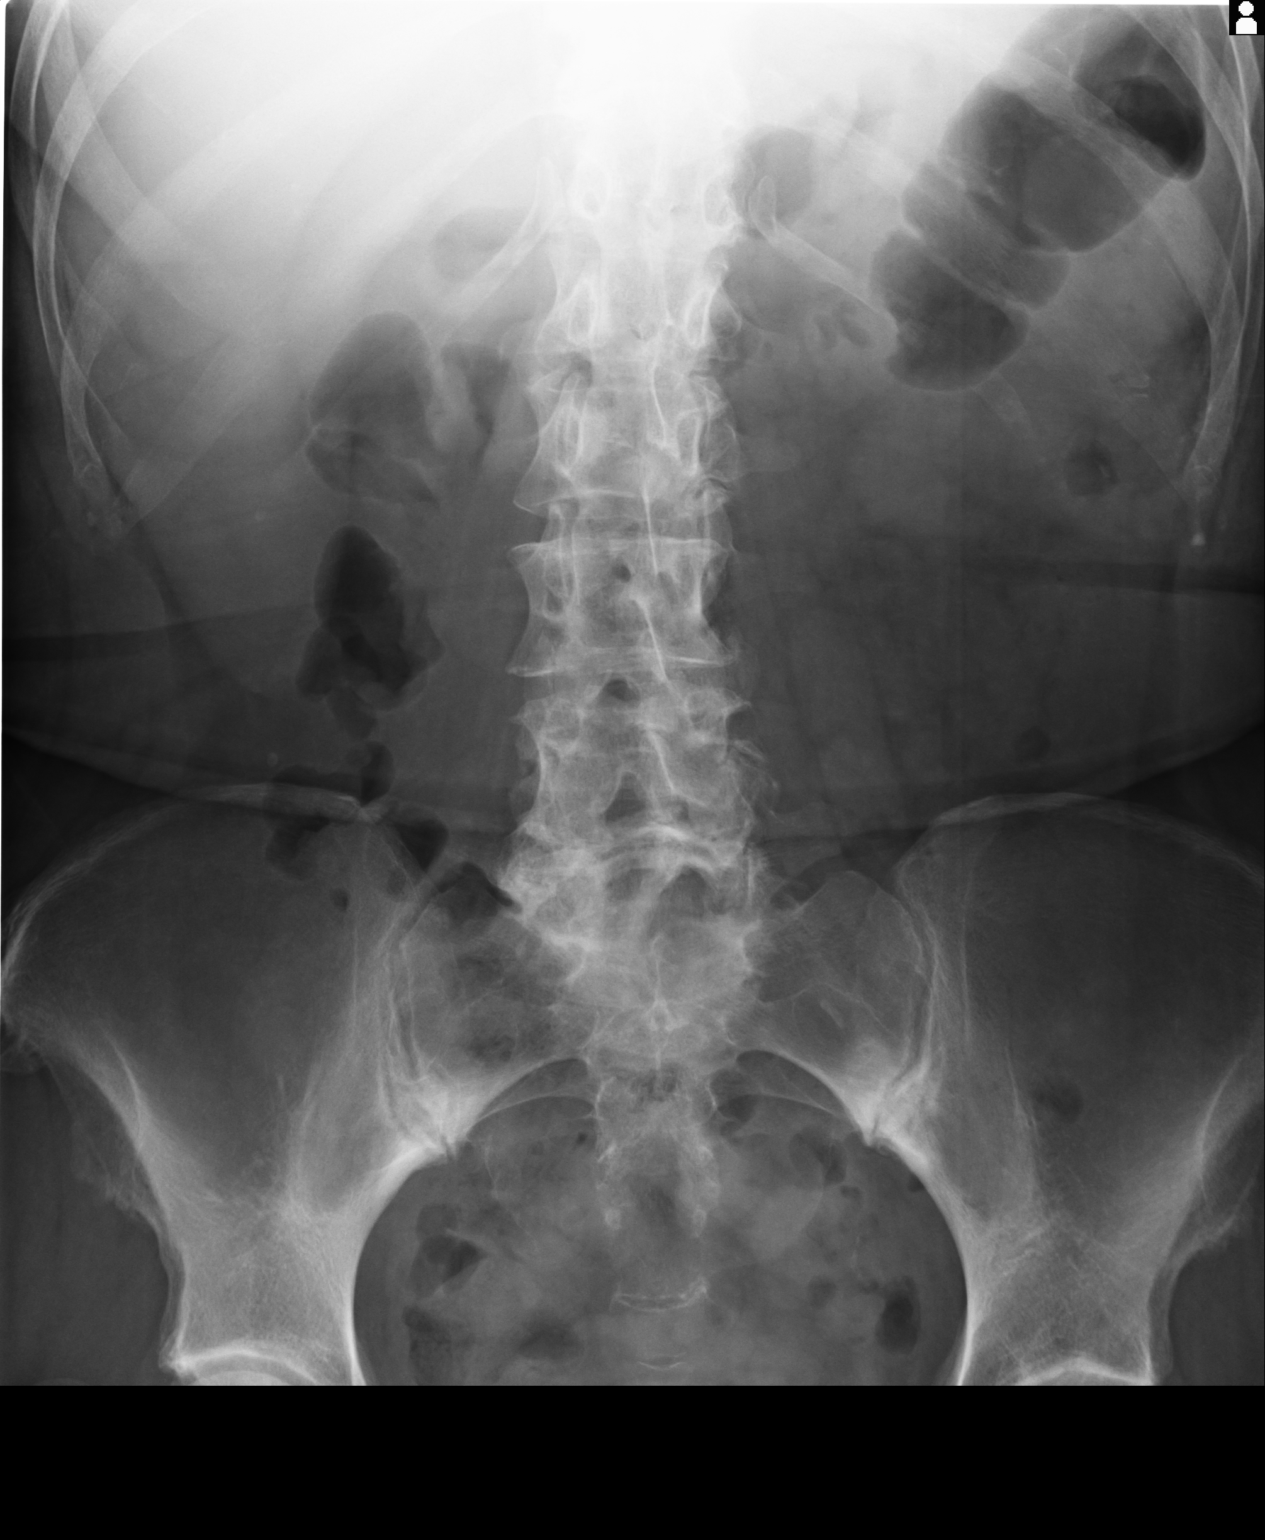
[im 2/2]
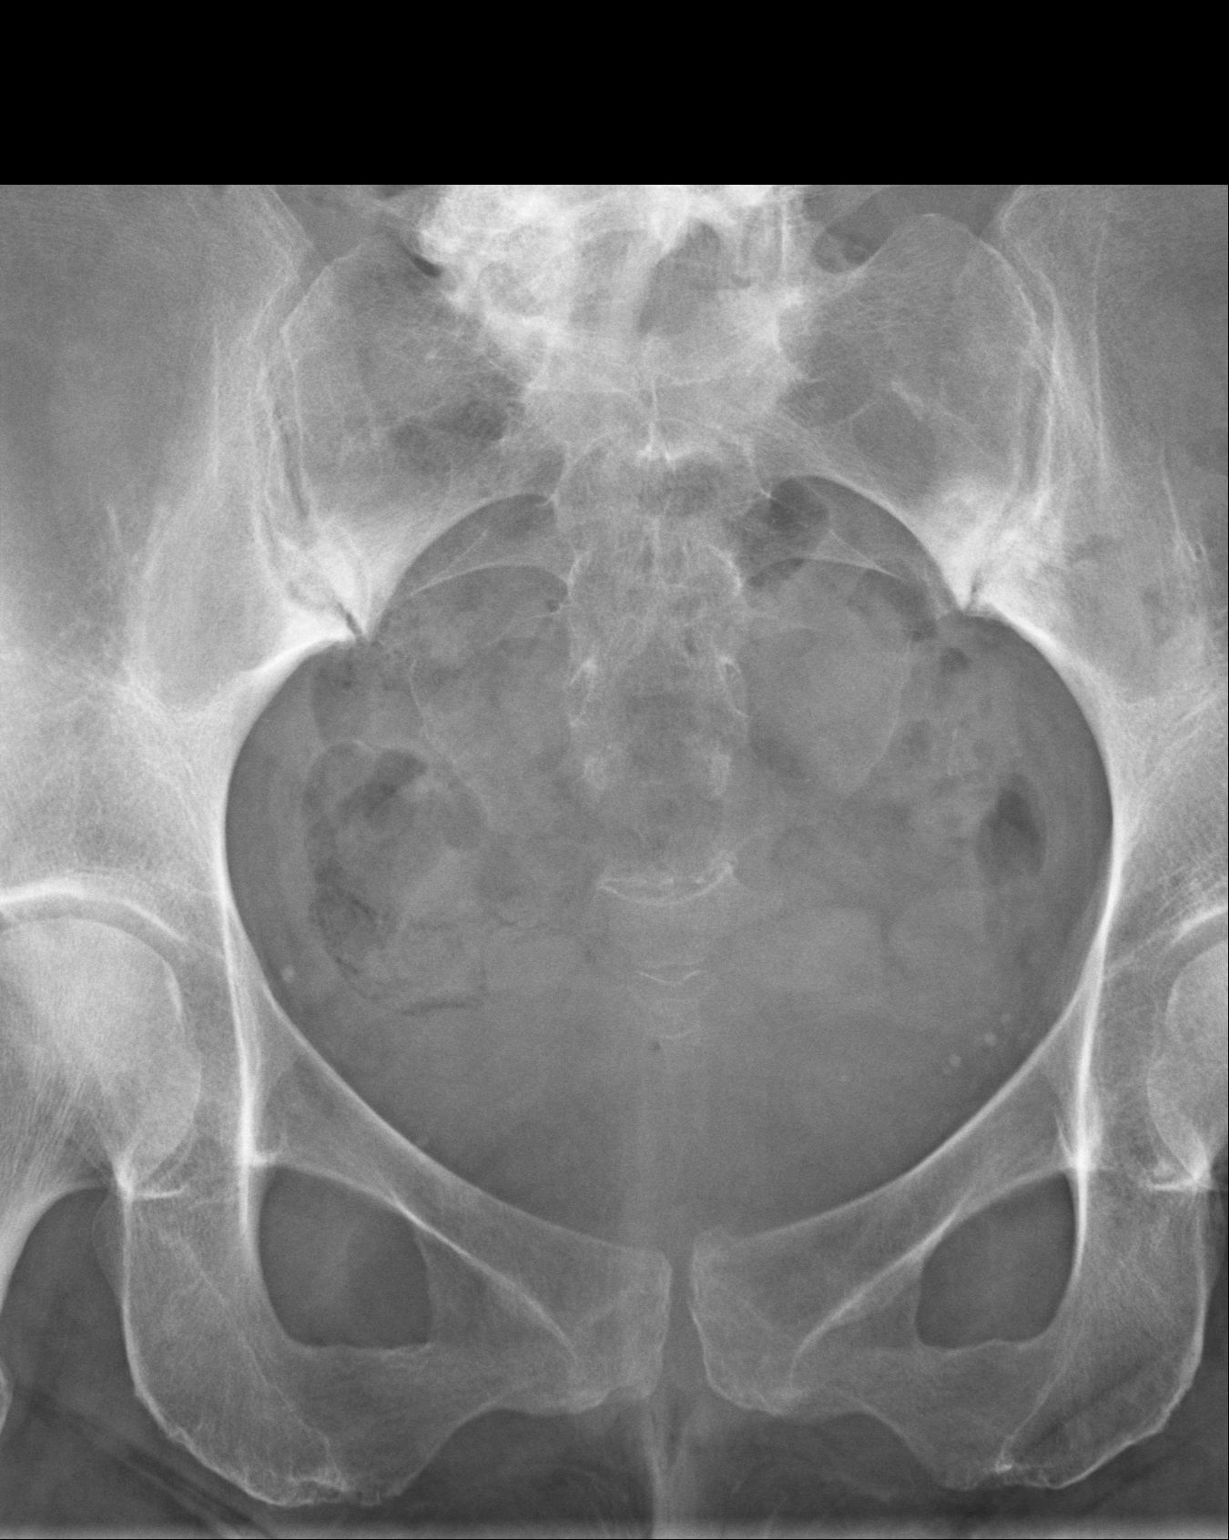

[2 of 2 positions shown; findings below may reference images not displayed]

FINDINGS: Soft tissue structures are unremarkable. Aortoiliac vascular
calcification noted. Punctate calcifications in the pelvis
consistent with phleboliths noted. Punctate calcification right
pelvis measuring approximately 4 mm may represent a distal right
ureteral stone. Gas pattern nonspecific. No acute bony
abnormalities.
IMPRESSION: Tiny approximately 4 mm calcification right lower pelvis, this could
represent a distal right ureteral stone.

## 2017-03-31 ENCOUNTER — Ambulatory Visit (INDEPENDENT_AMBULATORY_CARE_PROVIDER_SITE_OTHER): Payer: Self-pay | Admitting: Family Medicine

## 2017-03-31 ENCOUNTER — Encounter: Payer: Self-pay | Admitting: Family Medicine

## 2017-03-31 VITALS — BP 118/68 | Temp 98.2°F | Resp 16 | Wt 145.0 lb

## 2017-03-31 DIAGNOSIS — G8929 Other chronic pain: Secondary | ICD-10-CM

## 2017-03-31 DIAGNOSIS — R109 Unspecified abdominal pain: Secondary | ICD-10-CM

## 2017-03-31 DIAGNOSIS — M545 Low back pain, unspecified: Secondary | ICD-10-CM

## 2017-03-31 LAB — POCT URINALYSIS DIPSTICK
Bilirubin, UA: NEGATIVE
Glucose, UA: NEGATIVE
Ketones, UA: NEGATIVE
LEUKOCYTES UA: NEGATIVE
Nitrite, UA: NEGATIVE
PH UA: 6 (ref 5.0–8.0)
PROTEIN UA: NEGATIVE
Spec Grav, UA: 1.025 (ref 1.010–1.025)
UROBILINOGEN UA: 0.2 U/dL

## 2017-03-31 MED ORDER — OXYCODONE-ACETAMINOPHEN 7.5-325 MG PO TABS: 1.0000 | ORAL_TABLET | Freq: Four times a day (QID) | ORAL | 0 refills | Status: DC | PRN

## 2017-03-31 MED ORDER — CYCLOBENZAPRINE HCL 5 MG PO TABS: 5.0000 mg | ORAL_TABLET | Freq: Three times a day (TID) | ORAL | 1 refills | Status: DC | PRN

## 2017-03-31 NOTE — Patient Instructions (Signed)
   Go to the [facility] on [address] and see how much  Xray of :LS spine will cost out of pocket

## 2017-03-31 NOTE — Progress Notes (Signed)
Patient: Kaitlyn BaloMyra Hunke Female    DOB: November 29, 1960   56 y.o.   MRN: [id number] Visit Date: 03/31/2017  Today's Provider: Mila Merryonald Joanne Salah, MD   Chief Complaint  Patient presents with  . Flank Pain   Subjective:    HPI   Patient comes in today c/o right flank pain. She reports that she has had pain for a few days. She reports that the pain was at its worse yesterday. She took one of her husbands pain pill (patient not sure of the name), and it helped decrease the pain. She also mentioned that she is having to urinate more frequently. Patient denies any nausea or pain on urination. She also denies any hematuria.     No Known Allergies   Current Outpatient Prescriptions:  .  acetaminophen (TYLENOL ARTHRITIS PAIN) 650 MG CR tablet, Take 1,300 mg by mouth every 8 (eight) hours., Disp: , Rfl:   Review of Systems  Constitutional: Positive for chills and fatigue.  Gastrointestinal: Negative for nausea and vomiting.  Genitourinary: Positive for flank pain, frequency and urgency. Negative for decreased urine volume, difficulty urinating, hematuria and pelvic pain.  Musculoskeletal: Positive for arthralgias, back pain and myalgias.    Social History  Substance Use Topics  . Smoking status: Current Every Day Smoker    Packs/day: 0.50    Types: Cigarettes  . Smokeless tobacco: Never Used  . Alcohol use No   Objective:   BP 118/68 (BP Location: Left Arm, Patient Position: Sitting, Cuff Size: Normal)   Temp 98.2 F (36.8 C)   Resp 16   Wt 145 lb (65.8 kg)   LMP 02/10/2001   BMI 22.71 kg/m  Vitals:   03/31/17 0955  BP: 118/68  Resp: 16  Temp: 98.2 F (36.8 C)  Weight: 145 lb (65.8 kg)     Physical Exam  General appearance: alert, well developed, well nourished, cooperative and in no distress Head: Normocephalic, without obvious abnormality, atraumatic Respiratory: Respirations even and unlabored, normal respiratory rate MS: Very tender lumbar, thoracic  spine and para  vertebral muscles bilaterally. Normal LE strength and tone.   Results for orders placed or performed in visit on 03/31/17  POCT urinalysis dipstick  Result Value Ref Range   Color, UA amber    Clarity, UA cloudy    Glucose, UA negative    Bilirubin, UA negative    Ketones, UA negative    Spec Grav, UA 1.025 1.010 - 1.025   Blood, UA non hemolyzed trace    pH, UA 6.0 5.0 - 8.0   Protein, UA negative    Urobilinogen, UA 0.2 0.2 or 1.0 E.U./dL   Nitrite, UA negative    Leukocytes, UA Negative Negative       Assessment & Plan:     1. Flank pain  - POCT urinalysis dipstick - DG Lumbar Spine Complete; Future  2. Chronic bilateral low back pain without sciatica Considering duration and intensity of this intermittent pain, advised that imaging is needed. She does not have any insurance right now and expects to enroll soon. Advised [facility] cost of studies and financial assistance.  - cyclobenzaprine (FLEXERIL) 5 MG tablet; Take 1-2 tablets (5-10 mg total) by mouth 3 (three) times daily as needed for muscle spasms.  Dispense: 30 tablet; Refill: 1 - oxyCODONE-acetaminophen (PERCOCET) 7.5-325 MG tablet; Take 1 tablet by mouth every 6 (six) hours as needed for severe pain.  Dispense: 30 tablet; Refill: 0 -  DG Lumbar Spine Complete; Future       Mila Merryonald Geralyn Figiel, MD  [facility]

## 2017-05-07 ENCOUNTER — Other Ambulatory Visit: Payer: Self-pay | Admitting: Family Medicine

## 2017-05-07 DIAGNOSIS — M545 Low back pain: Principal | ICD-10-CM

## 2017-05-07 DIAGNOSIS — G8929 Other chronic pain: Secondary | ICD-10-CM

## 2017-05-07 MED ORDER — OXYCODONE-ACETAMINOPHEN 7.5-325 MG PO TABS: 1.0000 | ORAL_TABLET | Freq: Four times a day (QID) | ORAL | 0 refills | Status: DC | PRN

## 2017-05-07 MED ORDER — CYCLOBENZAPRINE HCL 5 MG PO TABS: 5.0000 mg | ORAL_TABLET | Freq: Three times a day (TID) | ORAL | 3 refills | Status: AC | PRN

## 2017-05-19 ENCOUNTER — Encounter: Payer: Self-pay | Admitting: Family Medicine

## 2017-06-25 ENCOUNTER — Emergency Department
Admission: EM | Admit: 2017-06-25 | Discharge: 2017-06-25 | Disposition: A | Discharge status: Home / Self Care | Payer: Self-pay | Attending: Emergency Medicine | Admitting: Emergency Medicine

## 2017-06-25 ENCOUNTER — Encounter: Payer: Self-pay | Admitting: Emergency Medicine

## 2017-06-25 ENCOUNTER — Emergency Department: Payer: Self-pay

## 2017-06-25 DIAGNOSIS — I6930 Unspecified sequelae of cerebral infarction: Secondary | ICD-10-CM | POA: Insufficient documentation

## 2017-06-25 DIAGNOSIS — R109 Unspecified abdominal pain: Secondary | ICD-10-CM

## 2017-06-25 DIAGNOSIS — F17210 Nicotine dependence, cigarettes, uncomplicated: Secondary | ICD-10-CM | POA: Insufficient documentation

## 2017-06-25 DIAGNOSIS — E119 Type 2 diabetes mellitus without complications: Secondary | ICD-10-CM | POA: Insufficient documentation

## 2017-06-25 DIAGNOSIS — N200 Calculus of kidney: Secondary | ICD-10-CM | POA: Insufficient documentation

## 2017-06-25 DIAGNOSIS — Q828 Other specified congenital malformations of skin: Secondary | ICD-10-CM | POA: Insufficient documentation

## 2017-06-25 DIAGNOSIS — I10 Essential (primary) hypertension: Secondary | ICD-10-CM | POA: Insufficient documentation

## 2017-06-25 LAB — COMPREHENSIVE METABOLIC PANEL
ALK PHOS: 55 U/L (ref 38–126)
ALT: 15 U/L (ref 14–54)
AST: 17 U/L (ref 15–41)
Albumin: 3.9 g/dL (ref 3.5–5.0)
Anion gap: 5 (ref 5–15)
BUN: 19 mg/dL (ref 6–20)
CALCIUM: 9.2 mg/dL (ref 8.9–10.3)
CO2: 26 mmol/L (ref 22–32)
Chloride: 110 mmol/L (ref 101–111)
Creatinine, Ser: 0.59 mg/dL (ref 0.44–1.00)
GFR calc non Af Amer: >60 mL/min (ref >60)
Glucose, Bld: 108 mg/dL — ABNORMAL HIGH (ref 65–99)
Potassium: 3.9 mmol/L (ref 3.5–5.1)
Sodium: 141 mmol/L (ref 135–145)
TOTAL PROTEIN: 7 g/dL (ref 6.5–8.1)
Total Bilirubin: 0.6 mg/dL (ref 0.3–1.2)

## 2017-06-25 LAB — URINALYSIS, COMPLETE (UACMP) WITH MICROSCOPIC
Bilirubin Urine: NEGATIVE
GLUCOSE, UA: NEGATIVE mg/dL
KETONES UR: 5 mg/dL — AB
NITRITE: NEGATIVE
PH: 5 (ref 5.0–8.0)
Protein, ur: 30 mg/dL — AB
Specific Gravity, Urine: 1.019 (ref 1.005–1.030)

## 2017-06-25 LAB — CBC
HEMATOCRIT: 39.7 % (ref 35.0–47.0)
HEMOGLOBIN: 13.1 g/dL (ref 12.0–16.0)
MCH: 33.1 pg (ref 26.0–34.0)
MCHC: 33.1 g/dL (ref 32.0–36.0)
MCV: 100.2 fL — ABNORMAL HIGH (ref 80.0–100.0)
Platelets: 189 10*3/uL (ref 150–440)
RBC: 3.96 MIL/uL (ref 3.80–5.20)
RDW: 15 % — ABNORMAL HIGH (ref 11.5–14.5)
WBC: 7.5 10*3/uL (ref 3.6–11.0)

## 2017-06-25 LAB — LIPASE, BLOOD: LIPASE: 30 U/L (ref 11–51)

## 2017-06-25 MED ORDER — HYDROMORPHONE HCL 1 MG/ML IJ SOLN
1.0000 mg | Freq: Once | INTRAMUSCULAR | Status: AC
  Administered 2017-06-25: 1 mg via INTRAVENOUS
  Filled 2017-06-25: qty 1

## 2017-06-25 MED ORDER — TAMSULOSIN HCL 0.4 MG PO CAPS
ORAL_CAPSULE | ORAL | Status: AC
  Administered 2017-06-25: 0.4 mg via ORAL
  Filled 2017-06-25: qty 1

## 2017-06-25 MED ORDER — ONDANSETRON HCL 4 MG/2ML IJ SOLN
4.0000 mg | Freq: Once | INTRAMUSCULAR | Status: AC
  Administered 2017-06-25: 4 mg via INTRAVENOUS
  Filled 2017-06-25: qty 2

## 2017-06-25 MED ORDER — TAMSULOSIN HCL 0.4 MG PO CAPS: 0.4000 mg | ORAL_CAPSULE | Freq: Every day | ORAL | 0 refills | Status: DC

## 2017-06-25 MED ORDER — OXYCODONE-ACETAMINOPHEN 5-325 MG PO TABS: 1.0000 | ORAL_TABLET | ORAL | 0 refills | Status: DC | PRN

## 2017-06-25 MED ORDER — HYDROMORPHONE HCL 1 MG/ML IJ SOLN: 1.0000 mg | Freq: Once | INTRAMUSCULAR | Status: DC

## 2017-06-25 MED ORDER — SODIUM CHLORIDE 0.9 % IV BOLUS (SEPSIS)
1000.0000 mL | Freq: Once | INTRAVENOUS | Status: AC
  Administered 2017-06-25: 1000 mL via INTRAVENOUS

## 2017-06-25 MED ORDER — IBUPROFEN 800 MG PO TABS: 800.0000 mg | ORAL_TABLET | Freq: Three times a day (TID) | ORAL | 0 refills | Status: DC | PRN

## 2017-06-25 MED ORDER — KETOROLAC TROMETHAMINE 30 MG/ML IJ SOLN
10.0000 mg | Freq: Once | INTRAMUSCULAR | Status: AC
  Administered 2017-06-25: 9.9 mg via INTRAVENOUS

## 2017-06-25 MED ORDER — KETOROLAC TROMETHAMINE 30 MG/ML IJ SOLN
INTRAMUSCULAR | Status: AC
  Administered 2017-06-25: 9.9 mg via INTRAVENOUS
  Filled 2017-06-25: qty 1

## 2017-06-25 MED ORDER — ONDANSETRON 4 MG PO TBDP: 4.0000 mg | ORAL_TABLET | Freq: Three times a day (TID) | ORAL | 0 refills | Status: DC | PRN

## 2017-06-25 MED ORDER — TAMSULOSIN HCL 0.4 MG PO CAPS
0.4000 mg | ORAL_CAPSULE | Freq: Once | ORAL | Status: AC
  Administered 2017-06-25: 0.4 mg via ORAL

## 2017-06-25 NOTE — ED Provider Notes (Signed)
The patient's pain is improved. Her abdomen is benign. Her urinalysis has negative nitrites. She has no dysuria. Her symptoms are not consistent with an infected stone. She is discharged home with strict return precautions.   Merrily Brittleifenbark, Ysela Hettinger, MD 06/25/17 [phone]

## 2017-06-25 NOTE — Discharge Instructions (Signed)
1. Take pain & nausea medicines as needed (Percocet/Zofran #20). Make sure to take a stool softener while taking narcotic pain medicines. °2. Take Flomax 0.4mg daily x 14 days. °3. Drink plenty of bottled or filtered water daily. °4. Return to the ER for worsening symptoms, persistent vomiting, fever, difficulty breathing or other concerns.  °

## 2017-06-25 NOTE — ED Provider Notes (Signed)
[facility] Emergency Department Provider Note   ____________________________________________   First MD Initiated Contact with Patient 06/25/17 [id number]     (approximate)  I have reviewed the triage vital signs and the nursing notes.   HISTORY  Chief Complaint Flank Pain    HPI Kaitlyn Hodges is a 56 y.o. female who presents to the ED from home with a chief complaint of left flank pain. Patient reports sudden onset left flank pain approximately 10 PM. Symptoms associated with nausea and vomiting. History of kidney stones and states this feels similarly. Denies history of lithotripsy or stenting. States her left-sided kidney stones always hurt worse. Denies recent fever, chills, chest pain, shortness of breath, abdominal pain, hematuria. Denies recent travel or trauma. Nothing makes her symptoms better or worse.   Past medical history Kidney stones Diabetes Seizure disorder Darier's disease  Patient Active Problem List   Diagnosis Date Noted  . Parotiditis 11/19/2015  . Sialoadenitis 11/19/2015  . Allergic rhinitis 02/08/2015  . Back pain 02/08/2015  . 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 Depression 02/08/2015  . History of diabetes mellitus, type II 02/08/2015  . History of kidney stones 02/08/2015  . History of sleep apnea 02/08/2015  . Hypercholesteremia 02/08/2015  . Hypertension 02/08/2015  . Late effect of stroke 02/08/2015  . Seizure disorder (HCC) 02/08/2015  . 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 Tobacco abuse 02/08/2015    Past Surgical History:  Procedure Laterality Date  . ABDOMINAL HYSTERECTOMY  2003   Total; Polycystic ovarian Disease    Prior to Admission medications   Medication Sig Start Date End Date Taking? Authorizing Provider  acetaminophen (TYLENOL ARTHRITIS PAIN) 650 MG CR tablet Take 1,300 mg by mouth every 8 (eight) hours.    [provider]  oxyCODONE-acetaminophen (PERCOCET) 7.5-325 MG tablet Take 1 tablet by  mouth every 6 (six) hours as needed for severe pain. 05/07/17   Malva LimesFisher, Donald E, MD    Allergies Patient has no known allergies.  Family History  Problem Relation Age of Onset  . Breast cancer Mother   . Stroke Mother   . Raynaud syndrome Mother   . Hypertension Father   . Bone cancer Father   . Alcohol abuse Father   . Skin cancer Sister   . Ovarian cancer Maternal Grandmother   . Alcohol abuse Maternal Grandfather   . Skin cancer Paternal Grandmother   . Hypertension Paternal Grandmother   . Heart attack Paternal Grandfather   . Stroke Paternal Grandfather     Social History Social History  Substance Use Topics  . Smoking status: Current Every Day Smoker    Packs/day: 0.50    Types: Cigarettes  . Smokeless tobacco: Never Used  . Alcohol use No    Review of Systems  Constitutional: No fever/chills. Eyes: No visual changes. ENT: No sore throat. Cardiovascular: Denies chest pain. Respiratory: Denies shortness of breath. Gastrointestinal: positive for left flank pain. No abdominal pain.  positive for nausea and vomiting.  No diarrhea.  No constipation. Genitourinary: Negative for dysuria. Musculoskeletal: Negative for back pain. Skin: Negative for rash. Neurological: Negative for headaches, focal weakness or numbness.   ____________________________________________   PHYSICAL EXAM:  VITAL SIGNS: ED Triage Vitals  Enc Vitals Group     BP 06/25/17 0438 119/86     Pulse Rate 06/25/17 0438 79     Resp 06/25/17 0438 17     Temp --      Temp src --  SpO2 06/25/17 0438 100 %     Weight 06/25/17 0435 145 lb (65.8 kg)     Height --      Head Circumference --      Peak Flow --      Pain Score --      Pain Loc --      Pain Edu? --      Excl. in GC? --     Constitutional: Alert and oriented. Well appearing and in mild acute distress. Eyes: Conjunctivae are normal. PERRL. EOMI. Head: Atraumatic. Nose: No congestion/rhinnorhea. Mouth/Throat: Mucous  membranes are moist.  Oropharynx non-erythematous. Neck: No stridor.   Cardiovascular: Normal rate, regular rhythm. Grossly normal heart sounds.  Good peripheral circulation. Respiratory: Normal respiratory effort.  No retractions. Lungs CTAB. Gastrointestinal: Soft and nontender to light and deep palpation. No distention. No abdominal bruits. Mild left CVA tenderness. Musculoskeletal: No lower extremity tenderness nor edema.  No joint effusions. Neurologic:  Normal speech and language. No gross focal neurologic deficits are appreciated. No gait instability. Skin:  Skin is warm, dry and intact. No rash noted. Psychiatric: Mood and affect are normal. Speech and behavior are normal.  ____________________________________________   LABS (all labs ordered are listed, but only abnormal results are displayed)  Labs Reviewed  COMPREHENSIVE METABOLIC PANEL - Abnormal; Notable for the following:       Result Value   Glucose, Bld 108 (*)    All other components within normal limits  CBC - Abnormal; Notable for the following:    MCV 100.2 (*)    RDW 15.0 (*)    All other components within normal limits  LIPASE, BLOOD  URINALYSIS, COMPLETE (UACMP) WITH MICROSCOPIC   ____________________________________________  EKG  none ____________________________________________  RADIOLOGY  Ct Renal Stone Study  Result Date: 06/25/2017 CLINICAL DATA:  LEFT back pain, LEFT flank pain since yesterday at 2200 hours. Nausea and vomiting. History of kidney stones with similar symptoms. History of hysterectomy. EXAM: CT ABDOMEN AND PELVIS WITHOUT CONTRAST TECHNIQUE: Multidetector CT imaging of the abdomen and pelvis was performed following the standard protocol without IV contrast. COMPARISON:  CT abdomen and pelvis March 16, 2015 FINDINGS: LOWER CHEST: Lung bases are clear. The visualized heart size is normal. No pericardial effusion. Debris distended thoracic esophagus. HEPATOBILIARY: Hepatomegaly, liver is  20 cm in cranial caudad dimension negative gallbladder. PANCREAS: Normal. SPLEEN: Normal. ADRENALS/URINARY TRACT: Kidneys are orthotopic, demonstrating normal size and morphology. 2 mm LEFT lower pole nephrolithiasis. Punctate RIGHT nephrolithiasis. Mild LEFT hydroureteronephrosis without ureteral calculus. Limited assessment for renal masses on this nonenhanced examination. The unopacified ureters are normal in course and caliber. Urinary bladder is partially distended and unremarkable. Thickened bilateral adrenal gland seen with hyperplasia. STOMACH/BOWEL: The stomach, small and large bowel are normal in course and caliber without inflammatory changes, sensitivity decreased by lack of enteric contrast. Small amount of retained large bowel stool. VASCULAR/LYMPHATIC: Aortoiliac vessels are normal in course and caliber. Mild calcific atherosclerosis . No lymphadenopathy by CT size criteria. REPRODUCTIVE: Status post hysterectomy. OTHER: No intraperitoneal free fluid or free air. MUSCULOSKELETAL: Non-acute. Mild broad dextroscoliosis. Severe lower lumbar facet arthropathy. IMPRESSION: 1. Mild LEFT hydroureteronephrosis without ureteral calculus; findings seen with recently passed stone, urinary tract infection or distal stricture. 2. Bilateral small nephrolithiasis. 3. 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 Recommend esophagram on nonemergent basis. 4. Mild hepatomegaly. Aortic Atherosclerosis (ICD10-I70.0). Electronically Signed   By: Awilda Metro M.D.   On: 06/25/2017 06:25    ____________________________________________  PROCEDURES  Procedure(s) performed: None  Procedures  Critical Care performed: No  ____________________________________________   INITIAL IMPRESSION / ASSESSMENT AND PLAN / ED COURSE  As part of my medical decision making, I reviewed the following data within the electronic MEDICAL RECORD NUMBER History obtained from family, Nursing notes  reviewed and incorporated, Labs reviewed, Radiograph reviewed and Notes from prior ED visits.   56 year old female who presents with left flank pain. Differential diagnosis includes, but is not limited to, ovarian cyst, ovarian torsion, acute appendicitis, diverticulitis, urinary tract infection/pyelonephritis, endometriosis, bowel obstruction, colitis, renal colic, gastroenteritis, hernia, pregnancy related pain including ectopic pregnancy, etc. Will obtain screening lab work, urinalysis; initiate IV fluid resuscitation, IV analgesia and reassess. Patient states she has never been imaged for kidney stones; will obtain CT to renal colic study.   Clinical Course as of Jun 25 712  Fri Jun 25, 2017  0710 Updated patient and spouse of CT imaging results. Patient will attempt to give urine specimen now. Patient returning. Will add additional analgesia. Anticipate discharge home +/- antibiotics if patient has UTI. Care transferred to Dr. Lamont Snowball pending urinalysis.  [JS]    Clinical Course User Index [JS] Irean Hong, MD     ____________________________________________   FINAL CLINICAL IMPRESSION(S) / ED DIAGNOSES  Final diagnoses:  Left flank pain  Kidney stone      NEW MEDICATIONS STARTED DURING THIS VISIT:  New Prescriptions   No medications on file     Note:  This document was prepared using Dragon voice recognition software and may include unintentional dictation errors.    Irean Hong, MD 06/25/17 [phone]

## 2017-06-25 NOTE — ED Notes (Signed)
Pt is resting in bed with husband at bedside. Pt has no complaints at this time.

## 2017-06-25 NOTE — ED Triage Notes (Signed)
Pt c/o left sided lower back pain that radiates into left flank area since last night at 2200. Pt having N/V and 5/10 pain. Pt has hx of kidney stones and similar pain.

## 2017-06-25 NOTE — ED Notes (Signed)
Pt states that Right side is starting to hurt. EDP made aware.

## 2017-12-26 ENCOUNTER — Encounter: Payer: Self-pay | Admitting: Emergency Medicine

## 2017-12-26 ENCOUNTER — Emergency Department: Payer: BLUE CROSS/BLUE SHIELD

## 2017-12-26 ENCOUNTER — Other Ambulatory Visit: Payer: Self-pay

## 2017-12-26 ENCOUNTER — Emergency Department
Admission: EM | Admit: 2017-12-26 | Discharge: 2017-12-26 | Disposition: A | Discharge status: Home / Self Care | Payer: BLUE CROSS/BLUE SHIELD | Attending: Emergency Medicine | Admitting: Emergency Medicine

## 2017-12-26 DIAGNOSIS — M25552 Pain in left hip: Secondary | ICD-10-CM

## 2017-12-26 DIAGNOSIS — F17210 Nicotine dependence, cigarettes, uncomplicated: Secondary | ICD-10-CM | POA: Insufficient documentation

## 2017-12-26 DIAGNOSIS — E119 Type 2 diabetes mellitus without complications: Secondary | ICD-10-CM | POA: Insufficient documentation

## 2017-12-26 DIAGNOSIS — I10 Essential (primary) hypertension: Secondary | ICD-10-CM | POA: Diagnosis not present

## 2017-12-26 HISTORY — DX: Cerebral infarction, unspecified: I63.9

## 2017-12-26 HISTORY — DX: Calculus of kidney: N20.0

## 2017-12-26 MED ORDER — LIDOCAINE 5 % EX PTCH: 1.0000 | MEDICATED_PATCH | Freq: Two times a day (BID) | CUTANEOUS | 0 refills | Status: AC

## 2017-12-26 MED ORDER — KETOROLAC TROMETHAMINE 30 MG/ML IJ SOLN
30.0000 mg | Freq: Once | INTRAMUSCULAR | Status: AC
  Administered 2017-12-26: 30 mg via INTRAMUSCULAR
  Filled 2017-12-26: qty 1

## 2017-12-26 MED ORDER — CYCLOBENZAPRINE HCL 5 MG PO TABS: ORAL_TABLET | ORAL | 0 refills | Status: DC

## 2017-12-26 MED ORDER — OXYCODONE-ACETAMINOPHEN 5-325 MG PO TABS
1.0000 | ORAL_TABLET | Freq: Once | ORAL | Status: AC
  Administered 2017-12-26: 1 via ORAL
  Filled 2017-12-26: qty 1

## 2017-12-26 MED ORDER — LIDOCAINE 5 % EX PTCH
1.0000 | MEDICATED_PATCH | CUTANEOUS | Status: DC
  Administered 2017-12-26: 1 via TRANSDERMAL
  Filled 2017-12-26: qty 1

## 2017-12-26 MED ORDER — ACETAMINOPHEN 160 MG/5ML PO SUSP
ORAL | Status: AC
  Filled 2017-12-26: qty 25

## 2017-12-26 MED ORDER — ACETAMINOPHEN 160 MG/5ML PO SOLN
650.0000 mg | Freq: Once | ORAL | Status: AC
  Administered 2017-12-26: 650 mg via ORAL
  Filled 2017-12-26: qty 20.3

## 2017-12-26 MED ORDER — KETOROLAC TROMETHAMINE 10 MG PO TABS: 10.0000 mg | ORAL_TABLET | Freq: Four times a day (QID) | ORAL | 0 refills | Status: DC | PRN

## 2017-12-26 NOTE — ED Notes (Addendum)
Pt states a day and a half ago had a "twinge" in L hip. States went away but came back yesterday. States pain was worse but "I couldn't move. Well I could sort of but I had to use a cane." states pain from L hip to L knee. Denies falling, twisting, getting hit. Denies using cane on every day basis, cane at bedside (took it from her husband who she states is a patient in ED).

## 2017-12-26 NOTE — ED Notes (Signed)
Pt taken to xray 

## 2017-12-26 NOTE — ED Provider Notes (Signed)
[facility] Emergency Department Provider Note  ____________________________________________  Time seen: Approximately 7:58 AM  I have reviewed the triage vital signs and the nursing notes.   HISTORY  Chief Complaint Leg Pain and Hip Pain    HPI Janis Ciampa is a 57 y.o. female that presents to the emergency department for evaluation of left hip pain for 3 days. Pain starts in left hip and radiates to outside of left thigh. Pains stops at the knee. Pain began after feeling a "twinge" in her hip. Pain improves after she is up walking around. It worsens after she has been sitting or laying for an extended period of time. No injury. She came to the emergency department because her husband was coming here to be seen. No abdominal pain, back pain, saddle paresthesias, bowel or bladder dysfunction, dysuria, urgency, frequency, numbness, tingling.   Past Medical History:  Diagnosis Date  . Kidney stones   . Stroke [facility])     Patient Active Problem List   Diagnosis Date Noted  . Parotiditis 11/19/2015  . Sialoadenitis 11/19/2015  . Allergic rhinitis 02/08/2015  . Back pain 02/08/2015  . 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 Depression 02/08/2015  . History of diabetes mellitus, type II 02/08/2015  . History of kidney stones 02/08/2015  . History of sleep apnea 02/08/2015  . Hypercholesteremia 02/08/2015  . Hypertension 02/08/2015  . Late effect of stroke 02/08/2015  . Seizure disorder (HCC) 02/08/2015  . 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 Tobacco abuse 02/08/2015    Past Surgical History:  Procedure Laterality Date  . ABDOMINAL HYSTERECTOMY  2003   Total; Polycystic ovarian Disease    Prior to Admission medications   Medication Sig Start Date End Date Taking? Authorizing Provider  acetaminophen (TYLENOL ARTHRITIS PAIN) 650 MG CR tablet Take 1,300 mg by mouth every 8 (eight) hours.    [provider]  cyclobenzaprine (FLEXERIL) 5 MG tablet Take  1-2 tablets 3 times daily as needed 12/26/17   Enid Derry, PA-C  ibuprofen (ADVIL,MOTRIN) 800 MG tablet Take 1 tablet (800 mg total) by mouth every 8 (eight) hours as needed for moderate pain. 06/25/17   Irean Hong, MD  ketorolac (TORADOL) 10 MG tablet Take 1 tablet (10 mg total) by mouth every 6 (six) hours as needed. 12/26/17   Enid Derry, PA-C  lidocaine (LIDODERM) 5 % Place 1 patch onto the skin every 12 (twelve) hours. Remove & Discard patch within 12 hours or as directed by MD 12/26/17 12/26/18  Enid Derry, PA-C  ondansetron (ZOFRAN ODT) 4 MG disintegrating tablet Take 1 tablet (4 mg total) by mouth every 8 (eight) hours as needed for nausea or vomiting. 06/25/17   Irean Hong, MD  oxyCODONE-acetaminophen (ROXICET) 5-325 MG tablet Take 1 tablet by mouth every 4 (four) hours as needed for severe pain. 06/25/17   Irean Hong, MD  tamsulosin (FLOMAX) 0.4 MG CAPS capsule Take 1 capsule (0.4 mg total) by mouth daily. 06/25/17   Irean Hong, MD    Allergies Patient has no known allergies.  Family History  Problem Relation Age of Onset  . Breast cancer Mother   . Stroke Mother   . Raynaud syndrome Mother   . Hypertension Father   . Bone cancer Father   . Alcohol abuse Father   . Skin cancer Sister   . Ovarian cancer Maternal Grandmother   . Alcohol abuse Maternal Grandfather   . Skin cancer Paternal Grandmother   . Hypertension Paternal  Grandmother   . Heart attack Paternal Grandfather   . Stroke Paternal Grandfather     Social History Social History   Tobacco Use  . Smoking status: Current Every Day Smoker    Packs/day: 0.50    Types: Cigarettes  . Smokeless tobacco: Never Used  Substance Use Topics  . Alcohol use: No    Alcohol/week: 0.0 oz  . Drug use: No     Review of Systems  Constitutional: No fever/chills Cardiovascular: No chest pain. Respiratory:  No SOB. Gastrointestinal: No abdominal pain.  No nausea, no vomiting.  Musculoskeletal: Positive for  hip pain. Skin: Negative for rash, abrasions, lacerations, ecchymosis. Neurological: Negative for numbness or tingling   ____________________________________________   PHYSICAL EXAM:  VITAL SIGNS: ED Triage Vitals  Enc Vitals Group     BP 12/26/17 0330 121/62     Pulse Rate 12/26/17 0330 63     Resp 12/26/17 0330 17     Temp 12/26/17 0330 98.1 F (36.7 C)     Temp Source 12/26/17 0330 Oral     SpO2 12/26/17 0330 100 %     Weight 12/26/17 0328 131 lb (59.4 kg)     Height 12/26/17 0328 5' 7.5" (1.715 m)     Head Circumference --      Peak Flow --      Pain Score 12/26/17 0328 8     Pain Loc --      Pain Edu? --      Excl. in GC? --      Constitutional: Alert and oriented. Well appearing and in no acute distress. Eyes: Conjunctivae are normal. PERRL. EOMI. Head: Atraumatic. ENT:      Ears:      Nose: No congestion/rhinnorhea.      Mouth/Throat: Mucous membranes are moist.  Neck: No stridor.  Cardiovascular: Normal rate, regular rhythm.  Good peripheral circulation. Respiratory: Normal respiratory effort without tachypnea or retractions. Lungs CTAB. Good air entry to the bases with no decreased or absent breath sounds. Gastrointestinal: Bowel sounds 4 quadrants. Soft and nontender to palpation. No guarding or rigidity. No palpable masses. No distention. No CVA tenderness. Musculoskeletal: Full range of motion to all extremities. No gross deformities appreciated. Antalgic gait. Bearing weight. Minimal tenderness to palpation over lumbar spine and lumbar paraspinal muscles. Tenderness to palpation over trochanteric bursa.  Neurologic:  Normal speech and language. No gross focal neurologic deficits are appreciated.  Skin:  Skin is warm, dry and intact. No rash noted. Psychiatric: Mood and affect are normal. Speech and behavior are normal. Patient exhibits appropriate insight and judgement.   ____________________________________________   LABS (all labs ordered are listed,  but only abnormal results are displayed)  Labs Reviewed - No data to display ____________________________________________  EKG   ____________________________________________  RADIOLOGY Lexine Baton, personally viewed and evaluated these images (plain radiographs) as part of my medical decision making, as well as reviewing the written report by the radiologist.  Dg Hip Unilat W Or Wo Pelvis 2-3 Views Left  Result Date: 12/26/2017 CLINICAL DATA:  Intermittent hip pain without injury or fever. EXAM: DG HIP (WITH OR WITHOUT PELVIS) 2-3V LEFT COMPARISON:  CT scan February 11, 2015 FINDINGS: A sclerotic focus in the left hip is stable since 2016, likely a bone island. The density projected over the left femoral head correlates with a bone island in the acetabulum on recent CT imaging. No fractures or significant degenerative changes. Mild degenerative changes in the inferior SI joints. No other acute  abnormalities. IMPRESSION: Negative. Electronically Signed   By: Gerome Sam III M.D   On: 12/26/2017 09:05    ____________________________________________    PROCEDURES  Procedure(s) performed:    Procedures    Medications  lidocaine (LIDODERM) 5 % 1 patch (1 patch Transdermal Patch Applied 12/26/17 0921)  acetaminophen (TYLENOL) solution 650 mg (650 mg Oral Given 12/26/17 0436)  oxyCODONE-acetaminophen (PERCOCET/ROXICET) 5-325 MG per tablet 1 tablet (1 tablet Oral Given 12/26/17 0814)  ketorolac (TORADOL) 30 MG/ML injection 30 mg (30 mg Intramuscular Given 12/26/17 0919)     ____________________________________________   INITIAL IMPRESSION / ASSESSMENT AND PLAN / ED COURSE  Pertinent labs & imaging results that were available during my care of the patient were reviewed by me and considered in my medical decision making (see chart for details).  Review of the [place] CSRS was performed in accordance of the [facility] prior to dispensing any controlled drugs.     Patient presented to  emergency department for evaluation of hip pain for 2 days.  Vital signs and exam are reassuring.  X-ray negative for acute bony abnormality. Findings were discussed with patient and results were given. Pain improved significantly with oxycodone, Toradol, Lidoderm.  Patient states she feels "much better." Patient will be discharged home with prescriptions for Toradol, Flexeril, Lidoderm. Patient is to follow up with PCP as directed. Patient is given ED precautions to return to the ED for any worsening or new symptoms.     ____________________________________________  FINAL CLINICAL IMPRESSION(S) / ED DIAGNOSES  Final diagnoses:  Pain of left hip joint      NEW MEDICATIONS STARTED DURING THIS VISIT:  ED Discharge Orders        Ordered    ketorolac (TORADOL) 10 MG tablet  Every 6 hours PRN     12/26/17 1009    cyclobenzaprine (FLEXERIL) 5 MG tablet     12/26/17 1009    lidocaine (LIDODERM) 5 %  Every 12 hours     12/26/17 1009          This chart was dictated using voice recognition software/Dragon. Despite best efforts to proofread, errors can occur which can change the meaning. Any change was purely unintentional.    Enid Derry, PA-C 12/26/17 1116    Jene Every, MD 12/26/17 [phone]

## 2017-12-26 NOTE — ED Triage Notes (Signed)
Pt c/o pain to left buttock radiating down the back of her leg to her knee x 1/2 days; constant pain, worse with movement; throbbing pain; denies injury;

## 2018-11-04 ENCOUNTER — Encounter: Payer: Self-pay | Admitting: Emergency Medicine

## 2018-11-04 ENCOUNTER — Other Ambulatory Visit: Payer: Self-pay

## 2018-11-04 DIAGNOSIS — E119 Type 2 diabetes mellitus without complications: Secondary | ICD-10-CM | POA: Insufficient documentation

## 2018-11-04 DIAGNOSIS — R1013 Epigastric pain: Secondary | ICD-10-CM | POA: Insufficient documentation

## 2018-11-04 DIAGNOSIS — Z79899 Other long term (current) drug therapy: Secondary | ICD-10-CM | POA: Insufficient documentation

## 2018-11-04 DIAGNOSIS — I10 Essential (primary) hypertension: Secondary | ICD-10-CM | POA: Insufficient documentation

## 2018-11-04 DIAGNOSIS — F17210 Nicotine dependence, cigarettes, uncomplicated: Secondary | ICD-10-CM | POA: Insufficient documentation

## 2018-11-04 LAB — URINALYSIS, COMPLETE (UACMP) WITH MICROSCOPIC
BILIRUBIN URINE: NEGATIVE
GLUCOSE, UA: NEGATIVE mg/dL
HGB URINE DIPSTICK: NEGATIVE
Ketones, ur: NEGATIVE mg/dL
Nitrite: NEGATIVE
PROTEIN: NEGATIVE mg/dL
Specific Gravity, Urine: 1.025 (ref 1.005–1.030)
pH: 5 (ref 5.0–8.0)

## 2018-11-04 LAB — CBC
HEMATOCRIT: 41.9 % (ref 36.0–46.0)
Hemoglobin: 13.4 g/dL (ref 12.0–15.0)
MCH: 30.8 pg (ref 26.0–34.0)
MCHC: 32 g/dL (ref 30.0–36.0)
MCV: 96.3 fL (ref 80.0–100.0)
NRBC: 0 % (ref 0.0–0.2)
Platelets: 223 10*3/uL (ref 150–400)
RBC: 4.35 MIL/uL (ref 3.87–5.11)
RDW: 14.4 % (ref 11.5–15.5)
WBC: 6.3 10*3/uL (ref 4.0–10.5)

## 2018-11-04 LAB — COMPREHENSIVE METABOLIC PANEL
ALT: 10 U/L (ref 0–44)
ANION GAP: 9 (ref 5–15)
AST: 13 U/L — AB (ref 15–41)
Albumin: 3.6 g/dL (ref 3.5–5.0)
Alkaline Phosphatase: 56 U/L (ref 38–126)
BILIRUBIN TOTAL: 0.5 mg/dL (ref 0.3–1.2)
BUN: 13 mg/dL (ref 6–20)
CHLORIDE: 105 mmol/L (ref 98–111)
CO2: 26 mmol/L (ref 22–32)
Calcium: 8.9 mg/dL (ref 8.9–10.3)
Creatinine, Ser: 0.83 mg/dL (ref 0.44–1.00)
GFR calc Af Amer: >60 mL/min (ref >60)
Glucose, Bld: 106 mg/dL — ABNORMAL HIGH (ref 70–99)
Potassium: 3.5 mmol/L (ref 3.5–5.1)
Sodium: 140 mmol/L (ref 135–145)
TOTAL PROTEIN: 7.2 g/dL (ref 6.5–8.1)

## 2018-11-04 LAB — TROPONIN I

## 2018-11-04 LAB — LIPASE, BLOOD: LIPASE: 39 U/L (ref 11–51)

## 2018-11-04 MED ORDER — SODIUM CHLORIDE 0.9% FLUSH: 3.0000 mL | Freq: Once | INTRAVENOUS | Status: DC

## 2018-11-04 NOTE — ED Triage Notes (Signed)
Pt arrives POV to triage with c/o upper abdominal pain which started around 1730 and has been increasing in intensity since that time. Pt is doubled over at this time.

## 2018-11-05 ENCOUNTER — Emergency Department
Admission: EM | Admit: 2018-11-05 | Discharge: 2018-11-05 | Disposition: A | Discharge status: Home / Self Care | Payer: Self-pay | Attending: Emergency Medicine | Admitting: Emergency Medicine

## 2018-11-05 ENCOUNTER — Emergency Department: Payer: Self-pay

## 2018-11-05 DIAGNOSIS — R1010 Upper abdominal pain, unspecified: Secondary | ICD-10-CM

## 2018-11-05 DIAGNOSIS — K2900 Acute gastritis without bleeding: Secondary | ICD-10-CM

## 2018-11-05 MED ORDER — DICYCLOMINE HCL 20 MG PO TABS: 20.0000 mg | ORAL_TABLET | Freq: Four times a day (QID) | ORAL | 0 refills | Status: DC | PRN

## 2018-11-05 MED ORDER — FENTANYL CITRATE (PF) 100 MCG/2ML IJ SOLN
50.0000 ug | Freq: Once | INTRAMUSCULAR | Status: AC
  Administered 2018-11-05: 50 ug via INTRAVENOUS
  Filled 2018-11-05: qty 2

## 2018-11-05 MED ORDER — SODIUM CHLORIDE 0.9 % IV BOLUS: 1000.0000 mL | Freq: Once | INTRAVENOUS | Status: DC

## 2018-11-05 MED ORDER — ONDANSETRON 4 MG PO TBDP: 4.0000 mg | ORAL_TABLET | Freq: Three times a day (TID) | ORAL | 0 refills | Status: DC | PRN

## 2018-11-05 MED ORDER — ONDANSETRON HCL 4 MG/2ML IJ SOLN
4.0000 mg | Freq: Once | INTRAMUSCULAR | Status: AC
  Administered 2018-11-05: 4 mg via INTRAVENOUS
  Filled 2018-11-05: qty 2

## 2018-11-05 MED ORDER — FAMOTIDINE IN NACL 20-0.9 MG/50ML-% IV SOLN
20.0000 mg | Freq: Once | INTRAVENOUS | Status: AC
  Administered 2018-11-05: 20 mg via INTRAVENOUS
  Filled 2018-11-05: qty 50

## 2018-11-05 MED ORDER — FAMOTIDINE 20 MG PO TABS: 20.0000 mg | ORAL_TABLET | Freq: Two times a day (BID) | ORAL | 0 refills | Status: DC

## 2018-11-05 NOTE — Discharge Instructions (Signed)
1.  You may take medicines as needed for abdominal pain and nausea (Bentyl/Zofran #20). 2.  Start Pepcid 20 mg twice daily. 3.  Eat bland foods x3 days, then slowly advance diet as tolerated. 4.  Return to the ER for worsening symptoms, persistent vomiting, difficulty breathing or other concerns.

## 2018-11-05 NOTE — ED Provider Notes (Signed)
[facility] Emergency Department Provider Note   ____________________________________________   First MD Initiated Contact with Patient 11/05/18 0016     (approximate)  I have reviewed the triage vital signs and the nursing notes.   HISTORY  Chief Complaint Abdominal Pain    HPI Kaitlyn Hodges is a 58 y.o. female who presents to the ED from home with a chief complaint of abdominal pain.  Patient reports upper abdominal pain which began around 5:30 PM.  She works at [employer] and ate some fries which exacerbated the pain.  Symptoms associated with one episode of vomiting.  Denies associated fever, chills, chest pain, shortness of breath, dysuria or diarrhea.  Denies recent travel or trauma.        Past Medical History:  Diagnosis Date  . Kidney stones   . Stroke [facility])     Patient Active Problem List   Diagnosis Date Noted  . Parotiditis 11/19/2015  . Sialoadenitis 11/19/2015  . Allergic rhinitis 02/08/2015  . Back pain 02/08/2015  . 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 Depression 02/08/2015  . History of diabetes mellitus, type II 02/08/2015  . History of kidney stones 02/08/2015  . History of sleep apnea 02/08/2015  . Hypercholesteremia 02/08/2015  . Hypertension 02/08/2015  . Late effect of stroke 02/08/2015  . Seizure disorder (HCC) 02/08/2015  . 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 Tobacco abuse 02/08/2015    Past Surgical History:  Procedure Laterality Date  . ABDOMINAL HYSTERECTOMY  2003   Total; Polycystic ovarian Disease    Prior to Admission medications   Medication Sig Start Date End Date Taking? Authorizing Provider  acetaminophen (TYLENOL ARTHRITIS PAIN) 650 MG CR tablet Take 1,300 mg by mouth every 8 (eight) hours.    [provider]  cyclobenzaprine (FLEXERIL) 5 MG tablet Take 1-2 tablets 3 times daily as needed 12/26/17   Enid Derry, PA-C  ibuprofen (ADVIL,MOTRIN) 800 MG tablet Take 1 tablet (800 mg total) by mouth  every 8 (eight) hours as needed for moderate pain. 06/25/17   Irean Hong, MD  ketorolac (TORADOL) 10 MG tablet Take 1 tablet (10 mg total) by mouth every 6 (six) hours as needed. 12/26/17   Enid Derry, PA-C  lidocaine (LIDODERM) 5 % Place 1 patch onto the skin every 12 (twelve) hours. Remove & Discard patch within 12 hours or as directed by MD 12/26/17 12/26/18  Enid Derry, PA-C  ondansetron (ZOFRAN ODT) 4 MG disintegrating tablet Take 1 tablet (4 mg total) by mouth every 8 (eight) hours as needed for nausea or vomiting. 06/25/17   Irean Hong, MD  oxyCODONE-acetaminophen (ROXICET) 5-325 MG tablet Take 1 tablet by mouth every 4 (four) hours as needed for severe pain. 06/25/17   Irean Hong, MD  tamsulosin (FLOMAX) 0.4 MG CAPS capsule Take 1 capsule (0.4 mg total) by mouth daily. 06/25/17   Irean Hong, MD    Allergies Patient has no known allergies.  Family History  Problem Relation Age of Onset  . Breast cancer Mother   . Stroke Mother   . Raynaud syndrome Mother   . Hypertension Father   . Bone cancer Father   . Alcohol abuse Father   . Skin cancer Sister   . Ovarian cancer Maternal Grandmother   . Alcohol abuse Maternal Grandfather   . Skin cancer Paternal Grandmother   . Hypertension Paternal Grandmother   . Heart attack Paternal Grandfather   . Stroke Paternal Grandfather     Social  History Social History   Tobacco Use  . Smoking status: Current Every Day Smoker    Packs/day: 0.50    Types: Cigarettes  . Smokeless tobacco: Never Used  Substance Use Topics  . Alcohol use: No    Alcohol/week: 0.0 standard drinks  . Drug use: No    Review of Systems  Constitutional: No fever/chills Eyes: No visual changes. ENT: No sore throat. Cardiovascular: Denies chest pain. Respiratory: Denies shortness of breath. Gastrointestinal: Positive for upper abdominal pain, nausea and vomiting.  No diarrhea.  No constipation. Genitourinary: Negative for  dysuria. Musculoskeletal: Negative for back pain. Skin: Negative for rash. Neurological: Negative for headaches, focal weakness or numbness.   ____________________________________________   PHYSICAL EXAM:  VITAL SIGNS: ED Triage Vitals  Enc Vitals Group     BP 11/04/18 2314 (!) 141/87     Pulse Rate 11/04/18 2314 69     Resp 11/04/18 2314 18     Temp 11/04/18 2314 97.7 F (36.5 C)     Temp Source 11/04/18 2314 Oral     SpO2 11/04/18 2314 97 %     Weight 11/04/18 2310 125 lb (56.7 kg)     Height 11/04/18 2310  (1.702 m)     Head Circumference --      Peak Flow --      Pain Score 11/04/18 2309 8     Pain Loc --      Pain Edu? --      Excl. in GC? --     Constitutional: Alert and oriented. Well appearing and in mild acute distress. Eyes: Conjunctivae are normal. PERRL. EOMI. Head: Atraumatic. Nose: No congestion/rhinnorhea. Mouth/Throat: Mucous membranes are moist.  Oropharynx non-erythematous. Neck: No stridor.   Cardiovascular: Normal rate, regular rhythm. Grossly normal heart sounds.  Good peripheral circulation. Respiratory: Normal respiratory effort.  No retractions. Lungs CTAB. Gastrointestinal: Soft and mildly tender to palpation epigastrium without rebound or guarding. No distention. No abdominal bruits. No CVA tenderness. Musculoskeletal: No lower extremity tenderness nor edema.  No joint effusions. Neurologic:  Normal speech and language. No gross focal neurologic deficits are appreciated. No gait instability. Skin:  Skin is warm, dry and intact. No rash noted. Psychiatric: Mood and affect are normal. Speech and behavior are normal.  ____________________________________________   LABS (all labs ordered are listed, but only abnormal results are displayed)  Labs Reviewed  COMPREHENSIVE METABOLIC PANEL - Abnormal; Notable for the following components:      Result Value   Glucose, Bld 106 (*)    AST 13 (*)    All other components within normal limits   URINALYSIS, COMPLETE (UACMP) WITH MICROSCOPIC - Abnormal; Notable for the following components:   Color, Urine YELLOW (*)    APPearance HAZY (*)    Leukocytes,Ua TRACE (*)    Bacteria, UA RARE (*)    All other components within normal limits  LIPASE, BLOOD  CBC  TROPONIN I   ____________________________________________  EKG  ED ECG REPORT I, SUNG,JADE J, the attending physician, personally viewed and interpreted this ECG.   Date: 11/05/2018  EKG Time: 2311  Rate: 69  Rhythm: normal EKG, normal sinus rhythm  Axis: Normal  Intervals:none  ST&T Change: Nonspecific  ____________________________________________  RADIOLOGY  ED MD interpretation:  Gallbladder polyp  Official radiology report(s): US Abdomen Limited Ruq  Result Date: 11/05/2018 CLINICAL DATA:  Right upper quadrant pain EXAM: ULTRASOUND ABDOMEN LIMITED RIGHT UPPER QUADRANT COMPARISON:  CT abdomen 06/25/2017 FINDINGS: Gallbladder: A small polyp is noted  the nondependent wall measuring 3 mm. The gallbladder is contracted without secondary signs of acute cholecystitis. No gallstones are noted. No sonographic Eulah Pont sign was elicited. Common bile duct: Diameter: Normal at 3 mm Liver: No focal lesion identified. Within normal limits in parenchymal echogenicity. Portal vein is patent on color Doppler imaging with normal direction of blood flow towards the liver. IMPRESSION: 3 mm gallbladder polyp otherwise negative exam. Electronically Signed   By: Tollie Eth M.D.   On: 11/05/2018 01:42    ____________________________________________   PROCEDURES  Procedure(s) performed (including Critical Care):  Procedures   ____________________________________________   INITIAL IMPRESSION / ASSESSMENT AND PLAN / ED COURSE  As part of my medical decision making, I reviewed the following data within the electronic MEDICAL RECORD NUMBER History obtained from family, Nursing notes reviewed and incorporated, Labs reviewed, EKG  interpreted, Old chart reviewed, Radiograph reviewed and Notes from prior ED visits        58 year old female who presents with upper abdominal pain. Differential diagnosis includes, but is not limited to, biliary disease (biliary colic, acute cholecystitis, cholangitis, choledocholithiasis, etc), intrathoracic causes for epigastric abdominal pain including ACS, gastritis, duodenitis, pancreatitis, small bowel or large bowel obstruction, abdominal aortic aneurysm, hernia, and ulcer(s).  Laboratory results unremarkable.  Will administer 50 mcg IV fentanyl for pain, 4 mg IV Zofran for nausea, 20 mg IV Pepcid for GERD, IV fluids and proceed with right upper quadrant abdominal ultrasound to evaluate for cholecystitis.   Clinical Course as of Nov 04 220  Sat Nov 05, 2018  0220 Patient feeling better. Updated her and spouse of [place] results.  Return precautions given.  Both verbalized understanding agree with plan of care.   [JS]    Clinical Course User Index [JS] Irean Hong, MD     ____________________________________________   FINAL CLINICAL IMPRESSION(S) / ED DIAGNOSES  Final diagnoses:  Pain of upper abdomen     ED Discharge Orders    None       Note:  This document was prepared using Dragon voice recognition software and may include unintentional dictation errors.   Irean Hong, MD 11/05/18 [phone]

## 2018-11-05 NOTE — ED Notes (Signed)
..  This tech in pt room at this time to check on pt, pt with no concerns at this time, pt with bed lowered, call light in reach, pt offered a warm blanket, pt not needing to use the restroom or be changed at this time, pt reminded that if a need arises the call light is within reach and to call for help. This tech will continue to monitor pt from monitors at nursing station and will continue to check in with pt hourly   

## 2018-12-05 ENCOUNTER — Telehealth: Payer: Self-pay | Admitting: *Deleted

## 2018-12-05 MED ORDER — DOXYCYCLINE HYCLATE 100 MG PO TABS: 100.0000 mg | ORAL_TABLET | Freq: Two times a day (BID) | ORAL | 0 refills | Status: DC

## 2018-12-05 MED ORDER — DOXYCYCLINE HYCLATE 100 MG PO TABS: 100.0000 mg | ORAL_TABLET | Freq: Two times a day (BID) | ORAL | 0 refills | Status: AC

## 2018-12-05 NOTE — Telephone Encounter (Signed)
Have sent prescription doxycycline to [facility]. Go to ER if any chest pain or progressive shortness of breath.

## 2018-12-05 NOTE — Telephone Encounter (Signed)
Patients husband advised and verbally voiced understanding. Patient uses [facility] and prefers that we send the prescription there instead of [facility]. I called [facility] pharmacy and cancelled prescription, then sent in prescription to [facility].

## 2018-12-05 NOTE — Telephone Encounter (Signed)
Patient's husband called office stating pt has cough and elevated temperature 100.0. Patient does not have insurance. They were requesting an ov but advised pt to monitor  symptoms at home. Husband is requesting an antibiotic. Offered pt a webex or telephone visit, which she is willing to do. However husband wanted to asked Dr. Sherrie Mustache about antibiotic first. Please advise?

## 2018-12-05 NOTE — Telephone Encounter (Signed)
Tried calling patient. Left message to call back. 

## 2018-12-06 ENCOUNTER — Telehealth: Payer: Self-pay

## 2018-12-06 NOTE — Telephone Encounter (Signed)
Pt needs a note to return to work on 12/12/2018.  Please call when ready.   Contact [phone]  Thanks,   -Vernona Rieger

## 2018-12-08 NOTE — Progress Notes (Signed)
Error

## 2018-12-08 NOTE — Telephone Encounter (Signed)
That's fine, can print note that she can return to work on 12-12-2018

## 2018-12-23 ENCOUNTER — Encounter: Payer: Self-pay | Admitting: Family Medicine

## 2018-12-23 ENCOUNTER — Telehealth: Payer: Self-pay

## 2018-12-23 ENCOUNTER — Other Ambulatory Visit: Payer: Self-pay

## 2018-12-23 ENCOUNTER — Ambulatory Visit (INDEPENDENT_AMBULATORY_CARE_PROVIDER_SITE_OTHER): Payer: Self-pay | Admitting: Family Medicine

## 2018-12-23 VITALS — Temp 100.0°F | Wt 124.0 lb

## 2018-12-23 DIAGNOSIS — J40 Bronchitis, not specified as acute or chronic: Secondary | ICD-10-CM

## 2018-12-23 MED ORDER — DOXYCYCLINE HYCLATE 100 MG PO TABS: 100.0000 mg | ORAL_TABLET | Freq: Two times a day (BID) | ORAL | 0 refills | Status: AC

## 2018-12-23 MED ORDER — ALBUTEROL SULFATE HFA 108 (90 BASE) MCG/ACT IN AERS: 1.0000 | INHALATION_SPRAY | Freq: Four times a day (QID) | RESPIRATORY_TRACT | 2 refills | Status: DC | PRN

## 2018-12-23 MED ORDER — BENZONATATE 200 MG PO CAPS: 200.0000 mg | ORAL_CAPSULE | Freq: Two times a day (BID) | ORAL | 0 refills | Status: DC | PRN

## 2018-12-23 NOTE — Progress Notes (Signed)
Patient: Kaitlyn Hodges Female    DOB: 03-May-1961   58 y.o.   MRN: [id number] Visit Date: 12/23/2018  Today's Provider: Mila Merry, MD   Chief Complaint  Patient presents with  . Cough  Virtual Visit via Video Note  I connected with Shiri Dobosz on 12/23/18 at  9:40 AM EDT by a video enabled telemedicine application and verified that I am speaking with the correct person using two identifiers.   I discussed the limitations of evaluation and management by telemedicine and the availability of in person appointments. The patient expressed understanding and agreed to proceed.    Subjective:     HPI She reports onset of cough and fever about 5 days ago. No shortness of breath, some allergy sx, but no green nasal discharge. No shortness of breath except during cough spells. Cough is day and night, keeping up sometimes. Not sure what highest temperature was, but states it was over a hundred earlier today.  No Known Allergies   Current Outpatient Medications:  .  acetaminophen (TYLENOL ARTHRITIS PAIN) 650 MG CR tablet, Take 1,300 mg by mouth every 8 (eight) hours., Disp: , Rfl:  .  cyclobenzaprine (FLEXERIL) 5 MG tablet, Take 1-2 tablets 3 times daily as needed (Patient not taking: Reported on 12/23/2018), Disp: 20 tablet, Rfl: 0 .  dicyclomine (BENTYL) 20 MG tablet, Take 1 tablet (20 mg total) by mouth every 6 (six) hours as needed. (Patient not taking: Reported on 12/23/2018), Disp: 20 tablet, Rfl: 0 .  famotidine (PEPCID) 20 MG tablet, Take 1 tablet (20 mg total) by mouth 2 (two) times daily. (Patient not taking: Reported on 12/23/2018), Disp: 30 tablet, Rfl: 0 .  ibuprofen (ADVIL,MOTRIN) 800 MG tablet, Take 1 tablet (800 mg total) by mouth every 8 (eight) hours as needed for moderate pain. (Patient not taking: Reported on 12/23/2018), Disp: 15 tablet, Rfl: 0 .  ketorolac (TORADOL) 10 MG tablet, Take 1 tablet (10 mg total) by mouth every 6 (six) hours as needed. (Patient not taking:  Reported on 12/23/2018), Disp: 20 tablet, Rfl: 0 .  lidocaine (LIDODERM) 5 %, Place 1 patch onto the skin every 12 (twelve) hours. Remove & Discard patch within 12 hours or as directed by MD (Patient not taking: Reported on 12/23/2018), Disp: 10 patch, Rfl: 0 .  ondansetron (ZOFRAN ODT) 4 MG disintegrating tablet, Take 1 tablet (4 mg total) by mouth every 8 (eight) hours as needed for nausea or vomiting. (Patient not taking: Reported on 12/23/2018), Disp: 20 tablet, Rfl: 0 .  oxyCODONE-acetaminophen (ROXICET) 5-325 MG tablet, Take 1 tablet by mouth every 4 (four) hours as needed for severe pain. (Patient not taking: Reported on 12/23/2018), Disp: 20 tablet, Rfl: 0 .  tamsulosin (FLOMAX) 0.4 MG CAPS capsule, Take 1 capsule (0.4 mg total) by mouth daily. (Patient not taking: Reported on 12/23/2018), Disp: 14 capsule, Rfl: 0  Review of Systems  Constitutional: Negative for appetite change, chills, fatigue and fever.  HENT: Positive for congestion, rhinorrhea and sinus pressure. Negative for sore throat.   Respiratory: Positive for cough and wheezing. Negative for chest tightness and shortness of breath.   Cardiovascular: Negative for chest pain and palpitations.  Gastrointestinal: Negative for abdominal pain, nausea and vomiting.  Neurological: Negative for dizziness and weakness.    Social History   Tobacco Use  . Smoking status: Current Every Day Smoker    Packs/day: 0.50    Types: Cigarettes  . Smokeless tobacco: Never Used  Substance Use  Topics  . Alcohol use: No    Alcohol/week: 0.0 standard drinks      Objective:   Temp 100 F (37.8 C) (Oral)   Wt 124 lb (56.2 kg)   LMP 02/10/2001   BMI 19.42 kg/m  Vitals:   12/23/18 0842  Temp: 100 F (37.8 C)  TempSrc: Oral  Weight: 124 lb (56.2 kg)     Physical Exam  General appearance: alert, well developed, well nourished, cooperative and in no distress Head: Normocephalic, without obvious abnormality, atraumatic Respiratory:  Respirations even and unlabored, normal respiratory rate Skin: Skin color, texture, turgor normal. No rashes seen  Psych: Appropriate mood and affect. Neurologic: Mental status: Alert, oriented to person, place, and time, thought content appropriate.     Assessment & Plan    1. Bronchitis  - doxycycline (VIBRA-TABS) 100 MG tablet; Take 1 tablet (100 mg total) by mouth 2 (two) times daily for 10 days.  Dispense: 20 tablet; Refill: 0 - albuterol (VENTOLIN HFA) 108 (90 Base) MCG/ACT inhaler; Inhale 1-2 puffs into the lungs every 6 (six) hours as needed for shortness of breath.  Dispense: 1 Inhaler; Refill: 2 - benzonatate (TESSALON) 200 MG capsule; Take 1 capsule (200 mg total) by mouth 2 (two) times daily as needed for cough.  Dispense: 20 capsule; Refill: 0  Does not appear significantly ill on video and in no distress. Clearly explained to go to ER if she starts experiencing persistent shortness of breath or worsening of sx.       I discussed the assessment and treatment plan with the patient. The patient was provided an opportunity to ask questions and all were answered. The patient agreed with the plan and demonstrated an understanding of the instructions.   The patient was advised to call back or seek an in-person evaluation if the symptoms worsen or if the condition fails to improve as anticipated.  I provided 10 minutes of non-face-to-face time during this encounter.   Mila Merryonald Shon Mansouri, MD  [facility]

## 2018-12-23 NOTE — Telephone Encounter (Signed)
Patient's husband Maisie Fus is requesting a RX for cough be sent to [facility]. CB#[phone]

## 2018-12-26 ENCOUNTER — Telehealth: Payer: Self-pay

## 2018-12-26 NOTE — Telephone Encounter (Signed)
Pt had a virtual visit 12/23/2018 for bronchitis.  She would like a note for work to be extended to 12/29/2018.  Contact Mr. Karpenko [phone]   Thanks,   -Vernona Rieger

## 2018-12-26 NOTE — Telephone Encounter (Signed)
done

## 2019-01-07 ENCOUNTER — Other Ambulatory Visit: Payer: Self-pay | Admitting: Family Medicine

## 2019-01-07 DIAGNOSIS — J40 Bronchitis, not specified as acute or chronic: Secondary | ICD-10-CM

## 2019-01-25 ENCOUNTER — Telehealth: Payer: Self-pay | Admitting: *Deleted

## 2019-01-25 ENCOUNTER — Other Ambulatory Visit: Payer: Self-pay

## 2019-01-25 ENCOUNTER — Ambulatory Visit (INDEPENDENT_AMBULATORY_CARE_PROVIDER_SITE_OTHER): Payer: Self-pay | Admitting: Family Medicine

## 2019-01-25 DIAGNOSIS — Z20822 Contact with and (suspected) exposure to covid-19: Secondary | ICD-10-CM

## 2019-01-25 DIAGNOSIS — R05 Cough: Secondary | ICD-10-CM

## 2019-01-25 DIAGNOSIS — J0100 Acute maxillary sinusitis, unspecified: Secondary | ICD-10-CM

## 2019-01-25 DIAGNOSIS — R059 Cough, unspecified: Secondary | ICD-10-CM

## 2019-01-25 DIAGNOSIS — R509 Fever, unspecified: Secondary | ICD-10-CM

## 2019-01-25 MED ORDER — FLUTICASONE PROPIONATE 50 MCG/ACT NA SUSP: 2.0000 | Freq: Every day | NASAL | 0 refills | Status: DC

## 2019-01-25 MED ORDER — DOXYCYCLINE HYCLATE 100 MG PO TABS: 100.0000 mg | ORAL_TABLET | Freq: Two times a day (BID) | ORAL | 0 refills | Status: AC

## 2019-01-25 NOTE — Progress Notes (Signed)
Patient: Kaitlyn Hodges Female    DOB: [birth year]   58 y.o.   MRN: [id number] Visit Date: 01/25/2019  Today's Provider: Shirlee LatchAngela Bacigalupo, MD   Chief Complaint  Patient presents with  . URI   Subjective:    I, Kaitlyn Hodges CMA, am acting as a scribe for Shirlee LatchAngela Bacigalupo, MD.   Virtual Visit via Video Note  I connected with Kaitlyn BaloMyra Weigand on 01/25/19 at  8:40 AM EDT by a video enabled telemedicine application and verified that I am speaking with the correct person using two identifiers.   Patient location: home [facility] Persons involved in the visit: patient, provider   I discussed the limitations of evaluation and management by telemedicine and the availability of in person appointments. The patient expressed understanding and agreed to proceed.    URI   The current episode started in the past 7 days. The problem has been gradually worsening. The maximum temperature recorded prior to her arrival was 101 - 101.9 F. Associated symptoms include coughing, headaches, sinus pain and sneezing. Pertinent negatives include no diarrhea, nausea or vomiting. She has tried nothing for the symptoms.   Has been treated twice for this in the last month.  States that she feels much better after being treated, but then after going outside 3-4 days later, her allergies worsen and then she gets sinus pain and pressure.  No SOB.  Non-productive cough.  Cough seems to be triggered by post-nasal drip. Symptoms present for ~7 days. Cough worse in last 3 days.  Works at [employer] and exposed to many people daily.  No Known Allergies   Current Outpatient Medications:  .  albuterol (VENTOLIN HFA) 108 (90 Base) MCG/ACT inhaler, Inhale 1-2 puffs into the lungs every 6 (six) hours as needed for shortness of breath., Disp: 1 Inhaler, Rfl: 2 .  benzonatate (TESSALON) 200 MG capsule, TAKE 1 CAPSULE BY MOUTH TWICE DAILY AS NEEDED FOR COUGH, Disp: 20 capsule, Rfl: 3 .   acetaminophen (TYLENOL ARTHRITIS PAIN) 650 MG CR tablet, Take 1,300 mg by mouth every 8 (eight) hours., Disp: , Rfl:  .  cyclobenzaprine (FLEXERIL) 5 MG tablet, Take 1-2 tablets 3 times daily as needed (Patient not taking: Reported on 12/23/2018), Disp: 20 tablet, Rfl: 0 .  dicyclomine (BENTYL) 20 MG tablet, Take 1 tablet (20 mg total) by mouth every 6 (six) hours as needed. (Patient not taking: Reported on 12/23/2018), Disp: 20 tablet, Rfl: 0 .  famotidine (PEPCID) 20 MG tablet, Take 1 tablet (20 mg total) by mouth 2 (two) times daily. (Patient not taking: Reported on 01/25/2019), Disp: 30 tablet, Rfl: 0 .  ibuprofen (ADVIL,MOTRIN) 800 MG tablet, Take 1 tablet (800 mg total) by mouth every 8 (eight) hours as needed for moderate pain. (Patient not taking: Reported on 12/23/2018), Disp: 15 tablet, Rfl: 0 .  ketorolac (TORADOL) 10 MG tablet, Take 1 tablet (10 mg total) by mouth every 6 (six) hours as needed. (Patient not taking: Reported on 12/23/2018), Disp: 20 tablet, Rfl: 0 .  ondansetron (ZOFRAN ODT) 4 MG disintegrating tablet, Take 1 tablet (4 mg total) by mouth every 8 (eight) hours as needed for nausea or vomiting. (Patient not taking: Reported on 12/23/2018), Disp: 20 tablet, Rfl: 0 .  oxyCODONE-acetaminophen (ROXICET) 5-325 MG tablet, Take 1 tablet by mouth every 4 (four) hours as needed for severe pain. (Patient not taking: Reported on 12/23/2018), Disp: 20 tablet, Rfl: 0 .  tamsulosin (FLOMAX) 0.4 MG CAPS capsule,  Take 1 capsule (0.4 mg total) by mouth daily. (Patient not taking: Reported on 12/23/2018), Disp: 14 capsule, Rfl: 0  Review of Systems  Constitutional: Positive for fever.  HENT: Positive for sinus pressure, sinus pain and sneezing.   Respiratory: Positive for cough, chest tightness and shortness of breath.   Gastrointestinal: Negative for diarrhea, nausea and vomiting.  Genitourinary: Negative.   Neurological: Positive for dizziness, light-headedness and headaches.    Social History    Tobacco Use  . Smoking status: Current Every Day Smoker    Packs/day: 0.50    Types: Cigarettes  . Smokeless tobacco: Never Used  Substance Use Topics  . Alcohol use: No    Alcohol/week: 0.0 standard drinks      Objective:   LMP 02/10/2001  There were no vitals filed for this visit.   Physical Exam Constitutional:      General: She is not in acute distress.    Appearance: She is ill-appearing. She is not toxic-appearing.  HENT:     Head: Normocephalic and atraumatic.  Pulmonary:     Effort: Pulmonary effort is normal. No respiratory distress.     Comments: Does cough occasionally and have to stop to catch her breath Neurological:     Mental Status: She is alert and oriented to person, place, and time.  Psychiatric:        Mood and Affect: Mood normal.        Behavior: Behavior normal.         Assessment & Plan   Follow Up Instructions: I discussed the assessment and treatment plan with the patient. The patient was provided an opportunity to ask questions and all were answered. The patient agreed with the plan and demonstrated an understanding of the instructions.   The patient was advised to call back or seek an in-person evaluation if the symptoms worsen or if the condition fails to improve as anticipated.  1. Acute maxillary sinusitis, recurrence not specified - symptoms and exam c/w sinusitis   - no symptoms of AOM, CAP, strep pharyngitis - see below regarding possible COVID19 - given duration of symptoms, suspect bacterial etiology - will treat with Doxycycline x7d - discussed symptomatic management (flonase, decongestants, etc), natural course, and return precautions   - discussed that uncontrolled allergic rhinitis can contribute and advised daily flonase and antihistamine  2. Cough 3. Fever, unspecified fever cause - patient with SOB, cough, fever - patient has significant interaction with various people throughout the day at her job - Discussed that  current during the current pandemic, her symptoms are concerning for possible COVID-19 infection -We will work to get her tested at our outpatient testing facility today -We discussed the importance of self quarantine for her and her household contacts until test results and longer if it is positive -Work note given -Advised on strict precautions for when to seek more medical care    Meds ordered this encounter  Medications  . doxycycline (VIBRA-TABS) 100 MG tablet    Sig: Take 1 tablet (100 mg total) by mouth 2 (two) times daily for 7 days.    Dispense:  14 tablet    Refill:  0  . fluticasone (FLONASE) 50 MCG/ACT nasal spray    Sig: Place 2 sprays into both nostrils daily.    Dispense:  16 g    Refill:  0     No follow-ups on file.   The entirety of the information documented in the History of Present Illness, Review of Systems  and Physical Exam were personally obtained by me. Portions of this information were initially documented by [facility], CMA and reviewed by me for thoroughness and accuracy.    Bacigalupo, Marzella Schlein, MD MPH [facility]

## 2019-01-25 NOTE — Telephone Encounter (Signed)
Attempted to contact pt, per Dr Mila Merry, to schedule COVID testing; left message with pt's husband for pt to call back; he verbalized understanding.

## 2019-01-25 NOTE — Telephone Encounter (Signed)
      Patient with cough, SOB, fever x3 days. Works at [employer] back for testing per Dr. Chanetta Marshall orders. Pt scheduled for today at 1045, [place] site. Testing process reviewed, pt verbalizes understanding.

## 2019-01-26 LAB — NOVEL CORONAVIRUS, NAA: SARS-CoV-2, NAA: NOT DETECTED

## 2019-03-27 ENCOUNTER — Other Ambulatory Visit: Payer: Self-pay

## 2019-03-27 ENCOUNTER — Encounter: Payer: Self-pay | Admitting: Family Medicine

## 2019-03-27 ENCOUNTER — Ambulatory Visit
Admission: RE | Admit: 2019-03-27 | Discharge: 2019-03-27 | Disposition: A | Discharge status: Home / Self Care | Payer: Self-pay | Source: Ambulatory Visit | Attending: Family Medicine | Admitting: Family Medicine

## 2019-03-27 ENCOUNTER — Ambulatory Visit (INDEPENDENT_AMBULATORY_CARE_PROVIDER_SITE_OTHER): Payer: Self-pay | Admitting: Family Medicine

## 2019-03-27 VITALS — Temp 100.5°F | Wt 125.0 lb

## 2019-03-27 DIAGNOSIS — R0602 Shortness of breath: Secondary | ICD-10-CM | POA: Insufficient documentation

## 2019-03-27 DIAGNOSIS — R509 Fever, unspecified: Secondary | ICD-10-CM

## 2019-03-27 DIAGNOSIS — M791 Myalgia, unspecified site: Secondary | ICD-10-CM

## 2019-03-27 DIAGNOSIS — R05 Cough: Secondary | ICD-10-CM

## 2019-03-27 DIAGNOSIS — R059 Cough, unspecified: Secondary | ICD-10-CM

## 2019-03-27 DIAGNOSIS — Z20822 Contact with and (suspected) exposure to covid-19: Secondary | ICD-10-CM

## 2019-03-27 NOTE — Progress Notes (Signed)
Patient: Kaitlyn Hodges Female    DOB: 08-25-1961   58 y.o.   MRN: [id number] Visit Date: 03/27/2019  Today's Provider: Lavon Paganini, MD   Chief Complaint  Patient presents with  . Cough   Subjective:    I,Joseline E. Rosas,RMA am acting as a Education administrator for [employer], PA-C.  Virtual Visit via Video Note  I connected with Tanyiah Jerrell on 03/27/19 at  2:00 PM EDT by a video enabled telemedicine application and verified that I am speaking with the correct person using two identifiers.   Patient location: home Provider location: home office  Persons involved in the visit: patient, provider    I discussed the limitations of evaluation and management by telemedicine and the availability of in person appointments. The patient expressed understanding and agreed to proceed.   Cough This is a new problem. The current episode started yesterday. The cough is productive of sputum (and dry). Associated symptoms include chills, a fever, rhinorrhea and shortness of breath. Pertinent negatives include no sore throat. The symptoms are aggravated by lying down. She has tried nothing (Reports in the last 2 days  no medicine but before 2 days ago she had been taking some sinus medicine OTC) for the symptoms. The treatment provided no relief.   Reports that it has been off and on for ~2 wks but really bad for last 2 days. Also with myalgias A coworker was tested for COVID and she was negative.  Works at [employer] in [employer].   No Known Allergies   Current Outpatient Medications:  .  acetaminophen (TYLENOL ARTHRITIS PAIN) 650 MG CR tablet, Take 1,300 mg by mouth every 8 (eight) hours., Disp: , Rfl:  .  albuterol (VENTOLIN HFA) 108 (90 Base) MCG/ACT inhaler, Inhale 1-2 puffs into the lungs every 6 (six) hours as needed for shortness of breath. (Patient not taking: Reported on 03/27/2019), Disp: 1 Inhaler, Rfl: 2 .  benzonatate (TESSALON) 200 MG capsule, TAKE 1 CAPSULE BY MOUTH TWICE  DAILY AS NEEDED FOR COUGH (Patient not taking: Reported on 03/27/2019), Disp: 20 capsule, Rfl: 3 .  cyclobenzaprine (FLEXERIL) 5 MG tablet, Take 1-2 tablets 3 times daily as needed (Patient not taking: Reported on 12/23/2018), Disp: 20 tablet, Rfl: 0 .  fluticasone (FLONASE) 50 MCG/ACT nasal spray, Place 2 sprays into both nostrils daily. (Patient not taking: Reported on 03/27/2019), Disp: 16 g, Rfl: 0  Review of Systems  Constitutional: Positive for chills and fever.  HENT: Positive for congestion and rhinorrhea. Negative for sore throat.   Respiratory: Positive for cough ("cough more than her usual" two days ago), chest tightness and shortness of breath.   Gastrointestinal: Positive for vomiting.  Neurological: Positive for dizziness, weakness and light-headedness.    Social History   Tobacco Use  . Smoking status: Current Every Day Smoker    Packs/day: 0.50    Types: Cigarettes  . Smokeless tobacco: Never Used  Substance Use Topics  . Alcohol use: No    Alcohol/week: 0.0 standard drinks      Objective:   Temp (!) 100.5 F (38.1 C) (Oral)   Wt 125 lb (56.7 kg)   LMP 02/10/2001   BMI 19.58 kg/m  Vitals:   03/27/19 1329  Temp: (!) 100.5 F (38.1 C)  TempSrc: Oral  Weight: 125 lb (56.7 kg)     Physical Exam Vitals signs reviewed.  Constitutional:      General: She is not in acute distress.  Appearance: She is ill-appearing. She is not toxic-appearing.  HENT:     Head: Normocephalic and atraumatic.  Eyes:     General: No scleral icterus.    Conjunctiva/sclera: Conjunctivae normal.  Pulmonary:     Effort: Pulmonary effort is normal. No respiratory distress.     Comments: + cough, able to speak in full sentences Neurological:     Mental Status: She is alert and oriented to person, place, and time. Mental status is at baseline.  Psychiatric:        Mood and Affect: Mood normal.        Behavior: Behavior normal.      No results found for any visits on 03/27/19.      Assessment & Plan    I discussed the assessment and treatment plan with the patient. The patient was provided an opportunity to ask questions and all were answered. The patient agreed with the plan and demonstrated an understanding of the instructions.   The patient was advised to call back or seek an in-person evaluation if the symptoms worsen or if the condition fails to improve as anticipated.  1. Cough 2. Fever, unspecified fever cause 3. Shortness of breath 4. Myalgia - new symptoms within last week - concerning for possible COVID19 infection as she has a lot of possible exposures in her job - discussed home quarantine for at least 10 days from onset of symptoms for her and all household contacts - will send her for CXR to r/o pneumonia - will send for outpatient coronavirus testing  - pending CXR results, may consider antibiotic/prednisone - discussed natural course, symptomatic management, and return/ER precautions - Novel Coronavirus, NAA ([facility]) - DG Chest 2 View; Future   Return if symptoms worsen or fail to improve.   The entirety of the information documented in the History of Present Illness, Review of Systems and Physical Exam were personally obtained by me. Portions of this information were initially documented by Hetty ElyJoseline Rosas, CMA and reviewed by me for thoroughness and accuracy.    Prospero Mahnke, Marzella SchleinAngela M, MD MPH [facility]

## 2019-03-28 ENCOUNTER — Telehealth: Payer: Self-pay | Admitting: Family Medicine

## 2019-03-28 NOTE — Telephone Encounter (Signed)
Pt needing to know if she will be prescribed a steroid or something after DR. B. Looks at her chest X-Rays.  Please advise pt asap.  Thanks, [employer]

## 2019-03-29 ENCOUNTER — Telehealth: Payer: Self-pay

## 2019-03-29 DIAGNOSIS — J9859 Other diseases of mediastinum, not elsewhere classified: Secondary | ICD-10-CM

## 2019-03-29 DIAGNOSIS — R9389 Abnormal findings on diagnostic imaging of other specified body structures: Secondary | ICD-10-CM

## 2019-03-29 MED ORDER — PREDNISONE 20 MG PO TABS: ORAL_TABLET | ORAL | 0 refills | Status: DC

## 2019-03-29 NOTE — Telephone Encounter (Signed)
Patient was advised. Patient states that medication and chest CT scan can be order. FYI

## 2019-03-29 NOTE — Telephone Encounter (Signed)
Prednisone sent to pharmacy. COVID test pending.  Chest CT ordered. She will get a call about scheduling this

## 2019-03-29 NOTE — Telephone Encounter (Signed)
-----   Message from Virginia Crews, MD sent at 03/28/2019  9:35 AM EDT ----- No pneumonia on CXR.  Does have emphysema.  We can give her a prednisone taper to help with symptoms.  She also has a new abnormality that could be large lymph nodes or something else.  It is recommended to get Chest CT scan to evaluate this and better be able to see what it is.  We can order this if patient is ok with it.

## 2019-03-29 NOTE — Telephone Encounter (Signed)
Patient was advised.  

## 2019-03-29 NOTE — Telephone Encounter (Signed)
See result note and other phone message

## 2019-03-30 LAB — NOVEL CORONAVIRUS, NAA: SARS-CoV-2, NAA: NOT DETECTED

## 2019-03-31 ENCOUNTER — Telehealth: Payer: Self-pay

## 2019-03-31 NOTE — Telephone Encounter (Signed)
-----   Message from Donald E Fisher, MD sent at 03/31/2019  8:12 AM EDT ----- covid test is negative 

## 2019-03-31 NOTE — Telephone Encounter (Signed)
Pt advised.   Thanks,   -Sabryna Lahm  

## 2019-04-06 ENCOUNTER — Telehealth: Payer: Self-pay | Admitting: Family Medicine

## 2019-04-06 NOTE — Telephone Encounter (Signed)
Patient did not have pneumonia on CXR. Doubt bacterial infection.  Did she take prednisone? This should have been the most helpful treatment.

## 2019-04-06 NOTE — Telephone Encounter (Signed)
Pt's husband Marcello Moores is requesting an antibiotic to be sent to [place]. Marcello Moores stated pt was seen by Dr. Jacinto Reap on 03/27/2019. Marcello Moores stated pt isn't really feeling better and that pt has requested that he call the office to see if Dr. B would send Rx for antibiotic. Please advise. Thanks TNP

## 2019-04-07 ENCOUNTER — Other Ambulatory Visit: Payer: Self-pay

## 2019-04-07 ENCOUNTER — Ambulatory Visit
Admission: RE | Admit: 2019-04-07 | Discharge: 2019-04-07 | Disposition: A | Discharge status: Home / Self Care | Payer: Self-pay | Source: Ambulatory Visit | Attending: Family Medicine | Admitting: Family Medicine

## 2019-04-07 DIAGNOSIS — J9859 Other diseases of mediastinum, not elsewhere classified: Secondary | ICD-10-CM | POA: Insufficient documentation

## 2019-04-07 DIAGNOSIS — R9389 Abnormal findings on diagnostic imaging of other specified body structures: Secondary | ICD-10-CM | POA: Insufficient documentation

## 2019-04-10 ENCOUNTER — Telehealth: Payer: Self-pay

## 2019-04-10 DIAGNOSIS — K2289 Other specified disease of esophagus: Secondary | ICD-10-CM

## 2019-04-10 DIAGNOSIS — R933 Abnormal findings on diagnostic imaging of other parts of digestive tract: Secondary | ICD-10-CM

## 2019-04-10 DIAGNOSIS — K228 Other specified diseases of esophagus: Secondary | ICD-10-CM

## 2019-04-10 DIAGNOSIS — I700 Atherosclerosis of aorta: Secondary | ICD-10-CM | POA: Insufficient documentation

## 2019-04-10 MED ORDER — AMOXICILLIN-POT CLAVULANATE 875-125 MG PO TABS: 1.0000 | ORAL_TABLET | Freq: Two times a day (BID) | ORAL | 0 refills | Status: DC

## 2019-04-10 NOTE — Telephone Encounter (Signed)
-----   Message from Virginia Crews, MD sent at 04/10/2019  1:34 PM EDT ----- Abnormality that was concerning for lymph node or mass on chest XRay is apparently significant esophageal dilation and it is filled with debris.  The nodule of the L lung base is consistent with aspiration that is likely related to the esophageal issue.  Her cough is likely related to aspiration.  She needs to see GI for esophageal work-up.  Still having cough?  Would treat for aspiration pneumonia with antibiotic if so.

## 2019-04-10 NOTE — Telephone Encounter (Signed)
Patient's husband Marcello Moores) was advised and states that he will advise the patient.

## 2019-04-10 NOTE — Telephone Encounter (Signed)
Rx and referral sent

## 2019-04-10 NOTE — Telephone Encounter (Signed)
Patient was advised and states that she is still coughing. She states that medication can be send to [facility]. Patient states that she would go see GI for the esophageal work-up. Please advise.

## 2019-04-10 NOTE — Telephone Encounter (Signed)
Patient was advised of results via another message. 

## 2019-04-11 ENCOUNTER — Telehealth: Payer: Self-pay

## 2019-04-11 NOTE — Telephone Encounter (Signed)
Patient's husband Marcello Moores) called and stated that patient would like a work note if possible. Patient had went to work on yesterday (04/10/2019) and could hardly work her shift. Patient is going to activate her MyChart so that she can receive the work note via [employer], community. Please advise.

## 2019-04-12 NOTE — Telephone Encounter (Signed)
Patient's husband advised 

## 2019-04-12 NOTE — Telephone Encounter (Signed)
Letter sent to patient via [place]. Return to work 8/14

## 2019-04-14 ENCOUNTER — Encounter: Payer: Self-pay | Admitting: *Deleted

## 2019-04-17 ENCOUNTER — Telehealth: Payer: Self-pay | Admitting: Family Medicine

## 2019-04-17 NOTE — Telephone Encounter (Signed)
Please advise 

## 2019-04-17 NOTE — Telephone Encounter (Signed)
Pt got medication a day late and wasn't able to return to work until day instead of Friday as note stated.  Pt needing a new note to state to return to work on Monday - Today.  Thanks, [employer]

## 2019-04-18 NOTE — Telephone Encounter (Signed)
Pt wants to know if this letter can be sent through my-chart.   He said Dr. Jacinto Reap was going to send the other note through My-chart.  Con Memos

## 2019-04-18 NOTE — Telephone Encounter (Signed)
Pt ask if the date can be Tuesday 04/18/19 for her to return to work.  Kaitlyn Hodges

## 2019-04-18 NOTE — Telephone Encounter (Signed)
Ok for note to return to work Monday 04-17-2019

## 2019-04-18 NOTE — Telephone Encounter (Signed)
Letter sent to [place].   Thanks,   -Mickel Baas

## 2019-05-26 ENCOUNTER — Ambulatory Visit
Admission: RE | Admit: 2019-05-26 | Discharge: 2019-05-26 | Disposition: A | Discharge status: Home / Self Care | Payer: Self-pay | Source: Ambulatory Visit | Attending: Family Medicine | Admitting: Family Medicine

## 2019-05-26 ENCOUNTER — Telehealth: Payer: Self-pay

## 2019-05-26 ENCOUNTER — Ambulatory Visit (INDEPENDENT_AMBULATORY_CARE_PROVIDER_SITE_OTHER): Payer: Self-pay | Admitting: Family Medicine

## 2019-05-26 ENCOUNTER — Encounter: Payer: Self-pay | Admitting: Family Medicine

## 2019-05-26 ENCOUNTER — Other Ambulatory Visit: Payer: Self-pay

## 2019-05-26 DIAGNOSIS — Z20822 Contact with and (suspected) exposure to covid-19: Secondary | ICD-10-CM

## 2019-05-26 DIAGNOSIS — R05 Cough: Secondary | ICD-10-CM

## 2019-05-26 DIAGNOSIS — R509 Fever, unspecified: Secondary | ICD-10-CM

## 2019-05-26 DIAGNOSIS — R0981 Nasal congestion: Secondary | ICD-10-CM

## 2019-05-26 DIAGNOSIS — R059 Cough, unspecified: Secondary | ICD-10-CM

## 2019-05-26 DIAGNOSIS — K2289 Other specified disease of esophagus: Secondary | ICD-10-CM

## 2019-05-26 DIAGNOSIS — K228 Other specified diseases of esophagus: Secondary | ICD-10-CM

## 2019-05-26 MED ORDER — DOXYCYCLINE HYCLATE 100 MG PO TABS: 100.0000 mg | ORAL_TABLET | Freq: Two times a day (BID) | ORAL | 0 refills | Status: AC

## 2019-05-26 NOTE — Patient Instructions (Signed)
Aspiration Pneumonia °Aspiration pneumonia is an infection in the lungs. It occurs when saliva or liquid contaminated with bacteria is inhaled (aspirated) into the lungs. When these things get into the lungs, swelling (inflammation) and infection can occur. This can make it difficult to breathe. Aspiration pneumonia is a serious condition and can be life threatening. °What are the causes? °This condition is caused when saliva or liquid from the mouth, throat, or stomach is inhaled into the lungs, and when those fluids are contaminated with bacteria. °What increases the risk? °The following factors may make you more likely to develop this condition: °· A narrowing of the tube that carries food to the stomach (esophageal narrowing). °· Having gastroesophageal reflux disease (GERD). °· Having a weak immune system. °· Having diabetes. °· Having poor oral hygiene. °· Being malnourished. °The condition is more likely to occur when a person's cough (gag) reflex, or ability to swallow, has decreased. Some things that can cause this decrease include: °· Having a brain injury or disease, such as stroke, seizures, Parkinson disease, dementia, or amyotrophic lateral sclerosis (ALS). °· Being given a general anesthetic for procedures. °· Drinking too much alcohol. If a person passes out and vomits, vomit can be inhaled into the lungs. °· Taking certain medicines, such as tranquilizers or sedatives. °What are the signs or symptoms? °Symptoms of this condition include: °· Fever. °· A cough with secretions that are yellow, tan, or green. °· Breathing problems, such as wheezing or shortness of breath. °· Chest pain. °· Being more tired than usual (fatigue). °· Having a history of coughing while eating or drinking. °· Bad breath. °· Bluish color to the lips, skin, or fingers. °How is this diagnosed? °This condition may be diagnosed based on: °· A physical exam. °· Tests, such as: °? Chest X-ray. °? Sputum culture. Saliva and mucus  (sputum) are collected from the lungs or the tubes that carry air to the lungs (bronchi). The sputum is then tested for bacteria. °? Oximetry. A sensor or clip is placed on areas such as a finger, earlobe, or toe to measure the oxygen level in your blood. °? Blood tests. °? Swallowing study. This test looks at how food is swallowed and whether it goes into your breathing tube (trachea) or esophagus. °? Bronchoscopy. This test uses a flexible tube (bronchoscope) to see inside the lungs. °How is this treated? °This condition may be treated with: °· Medicines. Antibiotic medicine will be given to kill the pneumonia bacteria. Other medicines may also be used to reduce fever or pain. °· Breathing assistance and oxygen therapy. Depending on how well you are breathing, you may need to be given oxygen, or you may need breathing support from a breathing machine (ventilator). °· Thoracentesis. This is a procedure to remove fluid that has built up in the space between the linings of the chest wall and the lungs. °· Feeding tube and diet change. For people who have difficulty swallowing, a feeding tube might be placed in the stomach, or they may be asked to avoid certain food textures or liquids when eating. °Follow these instructions at home: °Medicines °· Take over-the-counter and prescription medicines only as told by your health care provider. °? If you were prescribed an antibiotic medicine, take it as told by your health care provider. Do not stop taking the antibiotic even if you start to feel better. °? Take cough medicine only if you are losing sleep. Cough medicine can prevent your body’s natural ability to remove mucus   from your lungs. °General instructions °· Carefully follow any eating instructions you were given, such as avoiding certain food textures or thickening your liquids. Thickening liquids reduces the risk of developing aspiration pneumonia again. °· Use breathing exercises such as postural drainage, deep  breathing, and incentive spirometry to help expel secretions. °· Rest as instructed by your health care provider. °· Sleep in a semi-upright position at night. Try to sleep in a reclining chair, or place a few pillows under your head. °· Do not use any products that contain nicotine or tobacco, such as cigarettes and e-cigarettes. If you need help quitting, ask your health care provider. °· Keep all follow-up visits as told by your health care provider. This is important. °Contact a health care provider if: °· You have a fever. °· You have a worsening cough with yellow, tan, or green secretions. °· You have coughing while eating or drinking. °Get help right away if: °· You have worsening shortness of breath, wheezing, or difficulty breathing. °· You have chest pain. °Summary °· Aspiration pneumonia is an infection in the lungs. It is caused when saliva or liquid from the mouth, throat, or stomach is inhaled into the lungs. °· Aspiration pneumonia is more likely to occur when a person's cough reflex or ability to swallow has decreased. °· Symptoms of aspiration pneumonia include coughing, breathing problems, fever, and chest pain. °· Aspiration pneumonia may be treated with antibiotic medicine, other medicines to reduce pain or fever, and breathing assistance or oxygen therapy. °This information is not intended to replace advice given to you by your health care provider. Make sure you discuss any questions you have with your health care provider. °Document Released: 06/14/2009 Document Revised: 07/30/2017 Document Reviewed: 09/22/2016 °Elsevier Patient Education © 2020 Elsevier Inc. ° °

## 2019-05-26 NOTE — Progress Notes (Signed)
Patient: Kaitlyn Hodges Female    DOB: November 08, 1960   58 y.o.   MRN: [id number] Visit Date: 05/26/2019  Today's Provider: Lavon Paganini, MD   Chief Complaint  Patient presents with  . Fever  . Cough  . Shortness of Breath   Subjective:    Virtual Visit via Video Note  I connected with Kaitlyn Hodges on 05/26/19 at  2:40 PM EDT by a video enabled telemedicine application and verified that I am speaking with the correct person using two identifiers.   I discussed the limitations of evaluation and management by telemedicine and the availability of in person appointments. The patient expressed understanding and agreed to proceed.   Patient location: home Provider location: [place] involved in the visit: patient, provider  I discussed the assessment and treatment plan with the patient. The patient was provided an opportunity to ask questions and all were answered. The patient agreed with the plan and demonstrated an understanding of the instructions.   The patient was advised to call back or seek an in-person evaluation if the symptoms worsen or if the condition fails to improve as anticipated.  Interactive audio and video communications were attempted, although failed due to patient's inability to connect to video. Continued visit with audio only interaction with patient agreement.   HPI  Patient is experiencing fever, cough, and shortness of breath that started last night.   Maxillary sinuses congested and painful. Has had post-tussive emesis.  T101 when taken 1 hour ago.  Had CT scan in 04/2019 that showed that the mediastinal mass seen on CXR was dilated esophagus filled with debris.  She has not seen GI and states she was not aware of this result.    No Known Allergies   Current Outpatient Medications:  .  acetaminophen (TYLENOL ARTHRITIS PAIN) 650 MG CR tablet, Take 1,300 mg by mouth every 8 (eight) hours., Disp: , Rfl:  .  albuterol (VENTOLIN  HFA) 108 (90 Base) MCG/ACT inhaler, Inhale 1-2 puffs into the lungs every 6 (six) hours as needed for shortness of breath. (Patient not taking: Reported on 03/27/2019), Disp: 1 Inhaler, Rfl: 2 .  cyclobenzaprine (FLEXERIL) 5 MG tablet, Take 1-2 tablets 3 times daily as needed (Patient not taking: Reported on 12/23/2018), Disp: 20 tablet, Rfl: 0 .  doxycycline (VIBRA-TABS) 100 MG tablet, Take 1 tablet (100 mg total) by mouth 2 (two) times daily for 7 days., Disp: 14 tablet, Rfl: 0 .  fluticasone (FLONASE) 50 MCG/ACT nasal spray, Place 2 sprays into both nostrils daily. (Patient not taking: Reported on 03/27/2019), Disp: 16 g, Rfl: 0  Review of Systems  Constitutional: Positive for fever.  Respiratory: Positive for cough and shortness of breath.   Cardiovascular: Negative.   Musculoskeletal: Negative.     Social History   Tobacco Use  . Smoking status: Current Every Day Smoker    Packs/day: 0.50    Types: Cigarettes  . Smokeless tobacco: Never Used  Substance Use Topics  . Alcohol use: No    Alcohol/week: 0.0 standard drinks      Objective:   LMP 02/10/2001  There were no vitals filed for this visit.There is no height or weight on file to calculate BMI.   Physical Exam Unable to see patient Unlabored breathing NAD  No results found for any visits on 05/26/19.     Assessment & Plan   1. Cough 2. Fever, unspecified fever cause 3. Sinus congestion - new episode, but recurrent  problem with recurrent pneumonias - likely related to aspiration pneumonia in the setting of esophageal dilation as below - will send for COVID testing and CXR - needs GI evaluation as this keeps recurring - will treat with Doxycycline x7d - discussed symptomatic management and return precautions - Novel Coronavirus, NAA ([facility])  4. 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 doxycycline (VIBRA-TABS) 100 MG tablet    Sig: Take 1 tablet (100 mg total) by mouth 2 (two) times daily for 7 days.    Dispense:  14 tablet    Refill:  0     Return if symptoms worsen or fail to improve.   The entirety of the information documented in the History of Present Illness, Review of Systems and Physical Exam were personally obtained by me. Portions of this information were initially documented by Presley Raddle, CMA and reviewed by me for thoroughness and accuracy.    , Marzella Schlein, MD MPH [facility]

## 2019-05-26 NOTE — Telephone Encounter (Signed)
Sent through Mychart

## 2019-05-26 NOTE — Telephone Encounter (Signed)
Patient's husband is requesting a doctor's note for her work. She is scheduled to work Midwife. Can you please send it to his email.

## 2019-05-27 LAB — NOVEL CORONAVIRUS, NAA: SARS-CoV-2, NAA: NOT DETECTED

## 2019-06-06 ENCOUNTER — Encounter: Payer: Self-pay | Admitting: *Deleted

## 2019-09-27 ENCOUNTER — Ambulatory Visit: Payer: Self-pay | Attending: Internal Medicine

## 2019-09-27 DIAGNOSIS — Z20822 Contact with and (suspected) exposure to covid-19: Secondary | ICD-10-CM | POA: Insufficient documentation

## 2019-09-28 LAB — NOVEL CORONAVIRUS, NAA: SARS-CoV-2, NAA: NOT DETECTED

## 2019-10-06 ENCOUNTER — Telehealth: Payer: Self-pay | Admitting: Family Medicine

## 2019-10-06 ENCOUNTER — Ambulatory Visit (INDEPENDENT_AMBULATORY_CARE_PROVIDER_SITE_OTHER): Payer: 59 | Admitting: Adult Health

## 2019-10-06 ENCOUNTER — Encounter: Payer: Self-pay | Admitting: Adult Health

## 2019-10-06 VITALS — Temp 100.0°F | Wt 125.0 lb

## 2019-10-06 DIAGNOSIS — Z720 Tobacco use: Secondary | ICD-10-CM

## 2019-10-06 DIAGNOSIS — J0110 Acute frontal sinusitis, unspecified: Secondary | ICD-10-CM | POA: Diagnosis not present

## 2019-10-06 DIAGNOSIS — R059 Cough, unspecified: Secondary | ICD-10-CM | POA: Insufficient documentation

## 2019-10-06 DIAGNOSIS — R05 Cough: Secondary | ICD-10-CM | POA: Diagnosis not present

## 2019-10-06 DIAGNOSIS — Z20822 Contact with and (suspected) exposure to covid-19: Secondary | ICD-10-CM

## 2019-10-06 DIAGNOSIS — R509 Fever, unspecified: Secondary | ICD-10-CM

## 2019-10-06 DIAGNOSIS — R0789 Other chest pain: Secondary | ICD-10-CM | POA: Diagnosis not present

## 2019-10-06 DIAGNOSIS — J040 Acute laryngitis: Secondary | ICD-10-CM

## 2019-10-06 DIAGNOSIS — J019 Acute sinusitis, unspecified: Secondary | ICD-10-CM | POA: Insufficient documentation

## 2019-10-06 MED ORDER — BENZONATATE 100 MG PO CAPS: 100.0000 mg | ORAL_CAPSULE | Freq: Two times a day (BID) | ORAL | 0 refills | Status: DC | PRN

## 2019-10-06 MED ORDER — PREDNISONE 10 MG (21) PO TBPK: ORAL_TABLET | ORAL | 0 refills | Status: DC

## 2019-10-06 MED ORDER — DOXYCYCLINE HYCLATE 100 MG PO TABS: 100.0000 mg | ORAL_TABLET | Freq: Two times a day (BID) | ORAL | 0 refills | Status: DC

## 2019-10-06 NOTE — Telephone Encounter (Signed)
Returned call. KW

## 2019-10-06 NOTE — Telephone Encounter (Signed)
Pt returned missed call. Please call pt back at [phone] asap.  Thanks, [employer]

## 2019-10-06 NOTE — Progress Notes (Addendum)
Patient: Kaitlyn Hodges Female    DOB: [birth year]   59 y.o.   MRN: [id number] Visit Date: 10/06/2019  Today's Provider: Marcille Buffy, FNP   Chief Complaint  Patient presents with  . Sinus Problem   Subjective:    Virtual Visit via Video Note  I connected with Markeeta Mangal on 10/06/19 at 11:00 AM EST by a video enabled telemedicine application and verified that I am speaking with the correct person using two identifiers.  Location: Patient:  At home  Provider: Provider: Provider's office at  [facility], [place].   I discussed the limitations of evaluation and management by telemedicine and the availability of in person appointments. The patient expressed understanding and agreed to proceed.   I discussed the assessment and treatment plan with the patient. The patient was provided an opportunity to ask questions and all were answered. The patient agreed with the plan and demonstrated an understanding of the instructions.   The patient was advised to call back or seek an in-person evaluation if the symptoms worsen or if the condition fails to improve as anticipated.   Sinus Problem This is a new problem. The current episode started in the past 7 days. The problem is unchanged. The maximum temperature recorded prior to her arrival was 100.4 - 100.9 F. The fever has been present for 1 to 2 days. Associated symptoms include chills, congestion, coughing (occasional. Denies any pleuritic pain or shoulder blade pain. ), diaphoresis, headaches (frontal sinus pressure ) and sinus pressure. Pertinent negatives include no ear pain, hoarse voice, neck pain, shortness of breath, sneezing, sore throat or swollen glands. Past treatments include acetaminophen. The treatment provided no relief.    She is having hoarseness this morning. She has had a cough for the past 7 days. She denies any cough prior to this.   She has fever 100.9. She has no pain breathing. She has  sore throat, chest tightness. Decreased  appetite. Denies any shortness of breath. She denies any difficulty swallowing. She has pain with swallowing.  Has been drinking hot chocolate.   Restaurant t she works at [employer] out, she says restaurant is closed and she had to have a covid test. She says hers was negative on Friday January 29th she had no symptoms at that time. She has a lot of sinus pressure and congestion. She has a productive cough yellow/ brown in color. Clear sputum at other times.  She denies any rib or sternum pain.  Denies any injury or trauma.   Patient  denies any fever, body aches,chills, rash, chest pain, shortness of breath, nausea, vomiting, or diarrhea.  Pertinent history reviewed below CT chest that contrast was found to have as below.  Patient was advised she still needs to see gastroenterology for the below results.  She may possibly need a pulmonary referral and should schedule a follow-up appointment with Dr. Caryn Section when she is well. CT Chest Wo Contrast: Result Notes  Virginia Crews, MD  04/10/2019 1:34 PM EDT    Abnormality that was concerning for lymph node or mass on chest XRay is apparently significant esophageal dilation and it is filled with debris. The nodule of the L lung base is consistent with aspiration that is likely related to the esophageal issue. Her cough is likely related to aspiration. She needs to see GI for esophageal work-up. Still having cough? Would treat for aspiration pneumonia with antibiotic if so.     No Known  Allergies   Current Outpatient Medications:  .  acetaminophen (TYLENOL ARTHRITIS PAIN) 650 MG CR tablet, Take 1,300 mg by mouth every 8 (eight) hours., Disp: , Rfl:  .  albuterol (VENTOLIN HFA) 108 (90 Base) MCG/ACT inhaler, Inhale 1-2 puffs into the lungs every 6 (six) hours as needed for shortness of breath., Disp: 1 Inhaler, Rfl: 2 .  fluticasone (FLONASE) 50 MCG/ACT nasal spray, Place 2 sprays into both nostrils  daily., Disp: 16 g, Rfl: 0 .  cyclobenzaprine (FLEXERIL) 5 MG tablet, Take 1-2 tablets 3 times daily as needed (Patient not taking: Reported on 12/23/2018), Disp: 20 tablet, Rfl: 0  Review of Systems  Constitutional: Positive for activity change (mild decrease drinking wArm fluids for throat. ), chills and diaphoresis. Negative for appetite change, fatigue, fever and unexpected weight change.  HENT: Positive for congestion, postnasal drip, rhinorrhea, sinus pressure, sinus pain and voice change (hoarsness - denies any swelling in the back of her throat. ). Negative for dental problem, drooling, ear discharge, ear pain, facial swelling, hearing loss, hoarse voice, mouth sores, nosebleeds, sneezing, sore throat, tinnitus and trouble swallowing (painful senies any choking or trouble swallowing. ).   Eyes: Negative.   Respiratory: Positive for cough (occasional. Denies any pleuritic pain or shoulder blade pain. ). Negative for apnea, choking, chest tightness, shortness of breath, wheezing and stridor.   Cardiovascular: Negative for chest pain, palpitations and leg swelling.  Gastrointestinal: Negative.   Endocrine: Negative.   Genitourinary: Negative.   Musculoskeletal: Negative.  Negative for neck pain.  Skin: Negative.   Allergic/Immunologic: Negative.   Neurological: Positive for headaches (frontal sinus pressure ). Negative for dizziness, tremors, seizures, syncope, facial asymmetry, speech difficulty, weakness, light-headedness and numbness.  Hematological: Negative.   Psychiatric/Behavioral: Negative.     Social History   Tobacco Use  . Smoking status: Current Every Day Smoker    Packs/day: 0.50    Types: Cigarettes  . Smokeless tobacco: Never Used  Substance Use Topics  . Alcohol use: No    Alcohol/week: 0.0 standard drinks      Objective:   Temp 100 F (37.8 C) (Oral)   Wt 125 lb (56.7 kg)   LMP 02/10/2001   BMI 19.58 kg/m  Vitals:   10/06/19 1045  Temp: 100 F (37.8 C)    TempSrc: Oral  Weight: 125 lb (56.7 kg)  Body mass index is 19.58 kg/m.   Physical Exam Telephone visit only she had no way of doing a video visit. No vital signs available.   Patient is alert and oriented and responsive to questions Engages in conversation with provider. Speaks in full sentences without any pauses without any shortness of breath or distress. She has hoarse voice.  She does not sound to be in any distress.  No results found for any visits on 10/06/19.     Assessment & Plan    No vital signs were available to provider during this visit.  Patient does not have a pulse oximetry.   1. Tobacco abuse smoking cessation advised.   2. Acute non-recurrent frontal sinusitis Meds ordered this encounter  Medications  . predniSONE (STERAPRED UNI-PAK 21 TAB) 10 MG (21) TBPK tablet    Sig: PO: Take 5 tablets day 1 and 2 :Take 4 tablets day 3: Take 3 tablets day 4:Take 2 tablets day five: Take 1 tablet day 6 and 7    Dispense:  21 tablet    Refill:  0  . doxycycline (VIBRA-TABS) 100 MG tablet  Sig: Take 1 tablet (100 mg total) by mouth 2 (two) times daily.    Dispense:  20 tablet    Refill:  0  . benzonatate (TESSALON) 100 MG capsule    Sig: Take 1 capsule (100 mg total) by mouth 2 (two) times daily as needed for cough. May cause drowsiness    Dispense:  20 capsule    Refill:  0     3. Tightness in chest Prednisone. Has cough, productive. Discussed over the counter Mucinex per package instructions ok as well.  She denies any other associated symptoms.  4. Cough Will give her Tessalon Perles, discussed deep breathing exercises and continue to exercise her lungs.  She also will take doxycycline, to cover any atypical pneumonias.  Given fever for the last 7 days.  5. Fever, unspecified fever cause Treatment as above.  Discussed Tylenol for fever package instructions.  6. Exposure to COVID-19 virus She agrees to go for Covid testing.  Information given below for Covid  testing, patient is advised she will need to go for Covid testing.  She will quarantine at home. Will send work note to [employer] account. Recommend obtaining a pulse oximetry if able to and monitoring pulse ox and heart rate.'s oximetry should remain above 95, and if any distress or symptoms worsening seek in person medical attention immediately.  Reviewed previous CT scan showing possible mediastinal changes, as well as possible aspiration pneumonia at that time.  She needs to follow-up with gastroenterology as she was referred by Dr. B in the office.  She also may need referral to pulmonary and will set up an appointment to discuss with Dr. Sherrie Mustache.  Work note sent to communications in [place] for her work per [employer] as we also discussed.  Covid Testing Instructions for [place]:  If you/your practice has a patient that needs an appointment, please have the patient text "COVID" to [phone], OR they can log on to [email or web address] to easily make an on-line appointment. Please note:  If you have a patient who does not have access to a smart phone or PC, you or the patient can call [phone] to get assistance.  If you have a patient that needs a test and appointments are full, please send a message to the [facility] Testing in-basket or call [phone]. A provider may request that we overbook additional tests per day.  Patient verbalized understanding of all instructions given and denies any further questions at this time.   Advised patient call the office or your primary care doctor for an appointment if no improvement within 72 hours or if any symptoms change or worsen at any time  Advised ER or urgent Care if after hours or on weekend. Call 911 for emergency symptoms at any time.Patinet verbalized understanding of all instructions given/reviewed and treatment plan and has no further questions or concerns at this time.     Patient is aware that she will go to the  emergency room, or be seen in person at a facility taking patients with COVID-19 symptoms immediately if any of her symptoms change or worsen.  She was aware of the risk of a telephone visit and wanted to proceed with the visit.  The entirety of the information documented in the History of Present Illness, Review of Systems and Physical Exam were personally obtained by me. Portions of this information were initially documented by the  Certified Medical Assistant whose name is documented in Epic and reviewed by me for thoroughness  and accuracy.  I have personally performed the exam and reviewed the chart and it is accurate to the best of my knowledge.  Museum/gallery conservator has been used and any errors in dictation or transcription are unintentional.  Eula Fried. Flinchum FNP-C  [facility]    I provided 30 minutes of non-face-to-face time during this encounter. Chart review and review of imaging and history as well.  Jairo Ben, FNP  [facility]

## 2019-10-09 ENCOUNTER — Ambulatory Visit: Payer: 59 | Attending: Internal Medicine

## 2019-10-09 DIAGNOSIS — Z20822 Contact with and (suspected) exposure to covid-19: Secondary | ICD-10-CM

## 2019-10-10 LAB — NOVEL CORONAVIRUS, NAA: SARS-CoV-2, NAA: NOT DETECTED

## 2019-10-20 ENCOUNTER — Telehealth: Payer: Self-pay | Admitting: Family Medicine

## 2019-10-20 NOTE — Telephone Encounter (Signed)
Pt seems like she is relapsing / she has the same symptoms that she had at her last appt with Marcelino Duster Flinchum and the cough and congestion has come back as well as a fever/ Pt had a covid test that came back negative / she wants to know if she can get a refill for benzonatate (TESSALON) 100 MG capsule /please advise

## 2019-10-20 NOTE — Telephone Encounter (Signed)
Pt's husband called back asking if someone was going to call the generic tessalon pearls.  He said she relly needed this before the weekend.  [facility] [address]  CB#  [phone]

## 2019-10-21 MED ORDER — BENZONATATE 100 MG PO CAPS: 100.0000 mg | ORAL_CAPSULE | Freq: Two times a day (BID) | ORAL | 0 refills | Status: DC | PRN

## 2019-11-07 ENCOUNTER — Encounter: Payer: Self-pay | Admitting: Family Medicine

## 2019-11-07 NOTE — Progress Notes (Signed)
Patient: Kaitlyn Hodges Female    DOB: 14-Feb-1961   59 y.o.   MRN: [id number] Visit Date: 11/07/2019  Today's Provider: Lelon Huh, MD   Chief Complaint  Patient presents with  . Cough  Virtual Visit via Telephone Note  I connected with Kaitlyn Hodges on 11/08/19 at  8:40 AM EST by telephone and verified that I am speaking with the correct person using two identifiers.  Location: Patient: Home Provider: [facility]   I discussed the limitations, risks, security and privacy concerns of performing an evaluation and management service by telephone and the availability of in person appointments. I also discussed with the patient that there may be a patient responsible charge related to this service. The patient expressed understanding and agreed to proceed.   Subjective:     Cough This is a recurrent problem. The current episode started more than 1 year ago. The problem has been unchanged. Associated symptoms include a fever, headaches, nasal congestion and rhinorrhea. Pertinent negatives include no chest pain, chills, ear congestion, ear pain or shortness of breath.  Patient had a virtual visit with Laverna Peace, FNP 1 month ago for Sinusitis, cough, fever and COVID exposure. She was prescribed Doxycycline, Tessalon and prednisone. Patient reports her symptoms improved for 15-20 days, then worsened again.   She is noted to have had suspected aspiration pneumonia on chest CT 04/07/2019 with extremely dilated esophagus and debris filled to the level of the glottis. GI referral was initialed but patient did not follow through since she did not have insurance at that time. She does now have insurance and is willing to proceed with referral.    No Known Allergies   Current Outpatient Medications:  .  acetaminophen (TYLENOL ARTHRITIS PAIN) 650 MG CR tablet, Take 1,300 mg by mouth every 8 (eight) hours., Disp: , Rfl:  .  albuterol (VENTOLIN HFA) 108 (90 Base) MCG/ACT  inhaler, Inhale 1-2 puffs into the lungs every 6 (six) hours as needed for shortness of breath. (Patient not taking: Reported on 11/07/2019), Disp: 1 Inhaler, Rfl: 2 .  benzonatate (TESSALON) 100 MG capsule, Take 1 capsule (100 mg total) by mouth 2 (two) times daily as needed for cough. May cause drowsiness (Patient not taking: Reported on 11/07/2019), Disp: 20 capsule, Rfl: 0 .  cyclobenzaprine (FLEXERIL) 5 MG tablet, Take 1-2 tablets 3 times daily as needed (Patient not taking: Reported on 12/23/2018), Disp: 20 tablet, Rfl: 0 .  fluticasone (FLONASE) 50 MCG/ACT nasal spray, Place 2 sprays into both nostrils daily. (Patient not taking: Reported on 11/07/2019), Disp: 16 g, Rfl: 0  Review of Systems  Constitutional: Positive for fatigue and fever. Negative for appetite change and chills.  HENT: Positive for congestion (head and chest congestion) and rhinorrhea. Negative for ear pain.   Respiratory: Positive for cough. Negative for chest tightness and shortness of breath.   Cardiovascular: Negative for chest pain and palpitations.  Gastrointestinal: Negative for abdominal pain, nausea and vomiting.  Neurological: Positive for weakness and headaches. Negative for dizziness.    Social History   Tobacco Use  . Smoking status: Current Every Day Smoker    Packs/day: 0.50    Types: Cigarettes  . Smokeless tobacco: Never Used  Substance Use Topics  . Alcohol use: No    Alcohol/week: 0.0 standard drinks      Objective:   Temp (!) 101 F (38.3 C) (Oral)   LMP 02/10/2001  Vitals:   11/07/19 1548  Temp: (!) 101  F (38.3 C)  TempSrc: Oral  There is no height or weight on file to calculate BMI.   Physical Exam  Awake, alert, oriented x 3. In no apparent distress       Assessment & Plan    1. Cough Persistent waxing and waning for nearly a year. Reportedly responds well to antibiotic and prednisone which were refilled today. Strongly suspect recurrent aspiration pneumonia related to  esophageal dilation as below.  - predniSONE (DELTASONE) 20 MG tablet; Take 60mg  PO daily x 2 days, then40mg  PO daily x 2 days, then 20mg  PO daily x 3 days  Dispense: 13 tablet; Refill: 0  Suspect some underlying chronic lung disease related to smoking.  - budesonide-formoterol (SYMBICORT) 160-4.5 MCG/ACT inhaler; Inhale 2 puffs into the lungs 2 (two) times daily.  Dispense: 1 Inhaler; Refill: 3  2. Shortness of breath  - budesonide-formoterol (SYMBICORT) 160-4.5 MCG/ACT inhaler; Inhale 2 puffs into the lungs 2 (two) times daily.  Dispense: 1 Inhaler; Refill: 3  3. Abnormal CT scan, esophagus  - Ambulatory referral to Gastroenterology  4. Pneumonia due to infectious organism, unspecified laterality, unspecified part of lung (suspected as above) - amoxicillin-clavulanate (AUGMENTIN) 875-125 MG tablet; Take 1 tablet by mouth 2 (two) times daily for 7 days.  Dispense: 14 tablet; Refill: 0 - benzonatate (TESSALON) 100 MG capsule; Take 1 capsule (100 mg total) by mouth 2 (two) times daily as needed for cough. May cause drowsiness  Dispense: 20 capsule; Refill: 0    I discussed the assessment and treatment plan with the patient. The patient was provided an opportunity to ask questions and all were answered. The patient agreed with the plan and demonstrated an understanding of the instructions.   The patient was advised to call back or seek an in-person evaluation if the symptoms worsen or if the condition fails to improve as anticipated.  I provided 14 minutes of non-face-to-face time during this encounter.   , MD  [facility]

## 2019-11-08 ENCOUNTER — Ambulatory Visit (INDEPENDENT_AMBULATORY_CARE_PROVIDER_SITE_OTHER): Payer: 59 | Admitting: Family Medicine

## 2019-11-08 VITALS — Temp 101.0°F

## 2019-11-08 DIAGNOSIS — R933 Abnormal findings on diagnostic imaging of other parts of digestive tract: Secondary | ICD-10-CM

## 2019-11-08 DIAGNOSIS — R05 Cough: Secondary | ICD-10-CM

## 2019-11-08 DIAGNOSIS — R0602 Shortness of breath: Secondary | ICD-10-CM | POA: Diagnosis not present

## 2019-11-08 DIAGNOSIS — R059 Cough, unspecified: Secondary | ICD-10-CM

## 2019-11-08 DIAGNOSIS — J189 Pneumonia, unspecified organism: Secondary | ICD-10-CM | POA: Diagnosis not present

## 2019-11-08 MED ORDER — BUDESONIDE-FORMOTEROL FUMARATE 160-4.5 MCG/ACT IN AERO: 2.0000 | INHALATION_SPRAY | Freq: Two times a day (BID) | RESPIRATORY_TRACT | 3 refills | Status: DC

## 2019-11-08 MED ORDER — BENZONATATE 100 MG PO CAPS: 100.0000 mg | ORAL_CAPSULE | Freq: Two times a day (BID) | ORAL | 0 refills | Status: DC | PRN

## 2019-11-08 MED ORDER — AMOXICILLIN-POT CLAVULANATE 875-125 MG PO TABS: 1.0000 | ORAL_TABLET | Freq: Two times a day (BID) | ORAL | 0 refills | Status: AC

## 2019-11-08 MED ORDER — PREDNISONE 20 MG PO TABS: ORAL_TABLET | ORAL | 0 refills | Status: DC

## 2019-12-06 ENCOUNTER — Other Ambulatory Visit: Payer: Self-pay

## 2019-12-06 ENCOUNTER — Ambulatory Visit: Payer: 59 | Admitting: Gastroenterology

## 2019-12-06 VITALS — BP 128/74 | HR 71 | Temp 97.9°F | Ht 67.0 in | Wt 135.4 lb

## 2019-12-06 DIAGNOSIS — R933 Abnormal findings on diagnostic imaging of other parts of digestive tract: Secondary | ICD-10-CM

## 2019-12-06 NOTE — Progress Notes (Signed)
Jonathon Bellows MD, MRCP(U.K) [address]  [place]  [place], [place] [address]  Main: [phone]  Fax: [phone]   Gastroenterology Consultation  Referring Provider:     Birdie Sons, MD Primary Care Physician:  Birdie Sons, MD Primary Gastroenterologist:  Dr. Jonathon Bellows  Reason for Consultation:    Abnormal CT scan of the chest in 04/2019        HPI:   Kaitlyn Hodges is a 59 y.o. y/o female referred for consultation & management  by Dr. Caryn Section, Kirstie Peri, MD.    She underwent a CT scan on 04/07/2019 rule out a mediastinal mass and demonstrated an esophagus that is extremely dilated and debris-filled nearly to the level of the glottis.  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  There was clustered centrilobular nodularity and groundglass in the dependent left lung base consistent with aspiration.  Recently seen on 11/08/2019 by Dr. Caryn Section for cough and shortness of breath.  She states that she has had issues with swallowing solids and liquids for over 9 years and lost over 250 pounds.  She is a smoker.  No prior GI evaluation.  She had a father as well as a brother who had issues with swallowing and she says it runs in the family.  She has developed a method to contort  her body to help swallow her foods.  Past Medical History:  Diagnosis Date  . Kidney stones   . Stroke [facility])     Past Surgical History:  Procedure Laterality Date  . ABDOMINAL HYSTERECTOMY  2003   Total; Polycystic ovarian Disease    Prior to Admission medications   Medication Sig Start Date End Date Taking? Authorizing Provider  acetaminophen (TYLENOL ARTHRITIS PAIN) 650 MG CR tablet Take 1,300 mg by mouth every 8 (eight) hours.    [provider]  albuterol (VENTOLIN HFA) 108 (90 Base) MCG/ACT inhaler Inhale 1-2 puffs into the lungs every 6 (six) hours as needed for shortness of breath. Patient not taking: Reported on 11/07/2019 12/23/18   Birdie Sons, MD  benzonatate (TESSALON) 100 MG capsule  Take 1 capsule (100 mg total) by mouth 2 (two) times daily as needed for cough. May cause drowsiness Patient not taking: Reported on 11/07/2019 10/21/19   Birdie Sons, MD  benzonatate (TESSALON) 100 MG capsule Take 1 capsule (100 mg total) by mouth 2 (two) times daily as needed for cough. May cause drowsiness 11/08/19   Birdie Sons, MD  budesonide-formoterol [facility]) 160-4.5 MCG/ACT inhaler Inhale 2 puffs into the lungs 2 (two) times daily. 11/08/19   Birdie Sons, MD  cyclobenzaprine (FLEXERIL) 5 MG tablet Take 1-2 tablets 3 times daily as needed Patient not taking: Reported on 12/23/2018 12/26/17   Laban Emperor, PA-C  fluticasone [facility] 50 MCG/ACT nasal spray Place 2 sprays into both nostrils daily. Patient not taking: Reported on 11/07/2019 01/25/19   Virginia Crews, MD  predniSONE (DELTASONE) 20 MG tablet Take 60mg  PO daily x 2 days, then40mg  PO daily x 2 days, then 20mg  PO daily x 3 days 11/08/19   Birdie Sons, MD    Family History  Problem Relation Age of Onset  . Breast cancer Mother   . Stroke Mother   . Raynaud syndrome Mother   . Hypertension Father   . Bone cancer Father   . Alcohol abuse Father   . Skin cancer Sister   . Ovarian cancer Maternal Grandmother   . Alcohol abuse Maternal Grandfather   .  Skin cancer Paternal Grandmother   . Hypertension Paternal Grandmother   . Heart attack Paternal Grandfather   . Stroke Paternal Grandfather      Social History   Tobacco Use  . Smoking status: Current Every Day Smoker    Packs/day: 0.50    Types: Cigarettes  . Smokeless tobacco: Never Used  Substance Use Topics  . Alcohol use: No    Alcohol/week: 0.0 standard drinks  . Drug use: No    Allergies as of 12/06/2019  . (No Known Allergies)    Review of Systems:    All systems reviewed and negative except where noted in HPI.   Physical Exam:  LMP 02/10/2001  Patient's last menstrual period was 02/10/2001. Psych:  Alert and cooperative. Normal  mood and affect. General:   Alert,  Well-developed, well-nourished, pleasant and cooperative in NAD Head:  Normocephalic and atraumatic. Eyes:  Sclera clear, no icterus.   Conjunctiva pink. Ears:  Normal auditory acuity. Neurologic:  Alert and oriented x3;  grossly normal neurologically. Psych:  Alert and cooperative. Normal mood and affect.  Imaging Studies: No results found.  Assessment and Plan:   Kaitlyn Hodges is a 59 y.o. y/o female has been referred for an abnormal CT in 04/20/2019.  The report suggest findings which are concerning for achalasia.  I would suggest that we proceed with endoscopy and based on findings will need a barium esophagogram and esophageal manometry to rule out achalasia.   I have discussed alternative options, risks & benefits,  which include, but are not limited to, bleeding, infection, perforation,respiratory complication & drug reaction.  The patient agrees with this plan & written consent will be obtained.     Follow up in 6-8 weeks  Dr Wyline Mood MD,MRCP(U.K)

## 2019-12-11 ENCOUNTER — Telehealth: Payer: Self-pay

## 2019-12-11 NOTE — Telephone Encounter (Signed)
Patient left voicemail saying she was experiencing severe pain and weakness. Would like the doctor to take her out of work until after her procedure. Please call patient

## 2019-12-12 ENCOUNTER — Other Ambulatory Visit
Admission: RE | Admit: 2019-12-12 | Discharge: 2019-12-12 | Disposition: A | Discharge status: Home / Self Care | Payer: 59 | Source: Ambulatory Visit | Attending: Gastroenterology | Admitting: Gastroenterology

## 2019-12-12 ENCOUNTER — Other Ambulatory Visit: Payer: Self-pay

## 2019-12-12 DIAGNOSIS — Z01812 Encounter for preprocedural laboratory examination: Secondary | ICD-10-CM | POA: Insufficient documentation

## 2019-12-12 DIAGNOSIS — Z20822 Contact with and (suspected) exposure to covid-19: Secondary | ICD-10-CM | POA: Insufficient documentation

## 2019-12-12 LAB — SARS CORONAVIRUS 2 (TAT 6-24 HRS): SARS Coronavirus 2: NEGATIVE

## 2019-12-12 NOTE — Telephone Encounter (Signed)
Spoke with pt and informed her of Dr. Johnney Killian recommendations. Pt agrees to move procedure to this week on 12-14-19. [facility] End[facility] has been notified. Pt is aware she'll need to go for her COVID test today.

## 2019-12-13 ENCOUNTER — Encounter: Payer: Self-pay | Admitting: Gastroenterology

## 2019-12-14 ENCOUNTER — Other Ambulatory Visit: Payer: Self-pay

## 2019-12-14 ENCOUNTER — Ambulatory Visit: Payer: 59 | Admitting: Anesthesiology

## 2019-12-14 ENCOUNTER — Encounter
Admission: RE | Disposition: A | Discharge status: Home / Self Care | Payer: Self-pay | Source: Home / Self Care | Attending: Gastroenterology

## 2019-12-14 ENCOUNTER — Encounter: Payer: Self-pay | Admitting: Gastroenterology

## 2019-12-14 ENCOUNTER — Ambulatory Visit
Admission: RE | Admit: 2019-12-14 | Discharge: 2019-12-14 | Disposition: A | Discharge status: Home / Self Care | Payer: 59 | Attending: Gastroenterology | Admitting: Gastroenterology

## 2019-12-14 DIAGNOSIS — Z90710 Acquired absence of both cervix and uterus: Secondary | ICD-10-CM | POA: Insufficient documentation

## 2019-12-14 DIAGNOSIS — Z79899 Other long term (current) drug therapy: Secondary | ICD-10-CM | POA: Insufficient documentation

## 2019-12-14 DIAGNOSIS — Z811 Family history of alcohol abuse and dependence: Secondary | ICD-10-CM | POA: Insufficient documentation

## 2019-12-14 DIAGNOSIS — K222 Esophageal obstruction: Secondary | ICD-10-CM

## 2019-12-14 DIAGNOSIS — Z7951 Long term (current) use of inhaled steroids: Secondary | ICD-10-CM | POA: Diagnosis not present

## 2019-12-14 DIAGNOSIS — Z808 Family history of malignant neoplasm of other organs or systems: Secondary | ICD-10-CM | POA: Insufficient documentation

## 2019-12-14 DIAGNOSIS — K228 Other specified diseases of esophagus: Secondary | ICD-10-CM | POA: Diagnosis not present

## 2019-12-14 DIAGNOSIS — F329 Major depressive disorder, single episode, unspecified: Secondary | ICD-10-CM | POA: Insufficient documentation

## 2019-12-14 DIAGNOSIS — R933 Abnormal findings on diagnostic imaging of other parts of digestive tract: Secondary | ICD-10-CM

## 2019-12-14 DIAGNOSIS — Z8249 Family history of ischemic heart disease and other diseases of the circulatory system: Secondary | ICD-10-CM | POA: Insufficient documentation

## 2019-12-14 DIAGNOSIS — X58XXXA Exposure to other specified factors, initial encounter: Secondary | ICD-10-CM | POA: Insufficient documentation

## 2019-12-14 DIAGNOSIS — Z87442 Personal history of urinary calculi: Secondary | ICD-10-CM | POA: Insufficient documentation

## 2019-12-14 DIAGNOSIS — R569 Unspecified convulsions: Secondary | ICD-10-CM | POA: Diagnosis not present

## 2019-12-14 DIAGNOSIS — Z8489 Family history of other specified conditions: Secondary | ICD-10-CM | POA: Diagnosis not present

## 2019-12-14 DIAGNOSIS — Z803 Family history of malignant neoplasm of breast: Secondary | ICD-10-CM | POA: Diagnosis not present

## 2019-12-14 DIAGNOSIS — F17210 Nicotine dependence, cigarettes, uncomplicated: Secondary | ICD-10-CM | POA: Insufficient documentation

## 2019-12-14 DIAGNOSIS — Z8673 Personal history of transient ischemic attack (TIA), and cerebral infarction without residual deficits: Secondary | ICD-10-CM | POA: Insufficient documentation

## 2019-12-14 DIAGNOSIS — Z7952 Long term (current) use of systemic steroids: Secondary | ICD-10-CM | POA: Insufficient documentation

## 2019-12-14 DIAGNOSIS — Z8041 Family history of malignant neoplasm of ovary: Secondary | ICD-10-CM | POA: Diagnosis not present

## 2019-12-14 DIAGNOSIS — T18128A Food in esophagus causing other injury, initial encounter: Secondary | ICD-10-CM | POA: Insufficient documentation

## 2019-12-14 DIAGNOSIS — Z823 Family history of stroke: Secondary | ICD-10-CM | POA: Diagnosis not present

## 2019-12-14 HISTORY — PX: ESOPHAGOGASTRODUODENOSCOPY (EGD) WITH PROPOFOL: SHX5813

## 2019-12-14 SURGERY — ESOPHAGOGASTRODUODENOSCOPY (EGD) WITH PROPOFOL
Anesthesia: General

## 2019-12-14 MED ORDER — PROPOFOL 500 MG/50ML IV EMUL
INTRAVENOUS | Status: DC | PRN
  Administered 2019-12-14: 130 ug/kg/min via INTRAVENOUS

## 2019-12-14 MED ORDER — PROPOFOL 10 MG/ML IV BOLUS
INTRAVENOUS | Status: DC | PRN
  Administered 2019-12-14: 60 mg via INTRAVENOUS
  Administered 2019-12-14: 10 mg via INTRAVENOUS

## 2019-12-14 MED ORDER — SODIUM CHLORIDE 0.9 % IV SOLN: INTRAVENOUS | Status: DC

## 2019-12-14 MED ORDER — GLYCOPYRROLATE 0.2 MG/ML IJ SOLN
INTRAMUSCULAR | Status: DC | PRN
  Administered 2019-12-14: .2 mg via INTRAVENOUS

## 2019-12-14 MED ORDER — LIDOCAINE HCL (CARDIAC) PF 100 MG/5ML IV SOSY
PREFILLED_SYRINGE | INTRAVENOUS | Status: DC | PRN
  Administered 2019-12-14: 100 mg via INTRATRACHEAL

## 2019-12-14 NOTE — Op Note (Signed)
[facility] Gastroenterology Patient Name: Cade Olberding Procedure Date: 12/14/2019 7:44 AM MRN: [id number] Account #: [id number] Date of Birth: 02-16-1961 Admit Type: Outpatient Age: 59 Room: [facility] ENDO ROOM 1 Gender: Female Note Status: Finalized Procedure:             Upper GI endoscopy Indications:           Abnormal CT of the GI tract Providers:             Wyline Mood MD, MD Medicines:             Monitored Anesthesia Care Complications:         No immediate complications. Procedure:             Pre-Anesthesia Assessment:                        - Prior to the procedure, a History and Physical was                         performed, and patient medications, allergies and                         sensitivities were reviewed. The patient's tolerance                         of previous anesthesia was reviewed.                        - The risks and benefits of the procedure and the                         sedation options and risks were discussed with the                         patient. All questions were answered and informed                         consent was obtained.                        - ASA Grade Assessment: III - A patient with severe                         systemic disease.                        After obtaining informed consent, the endoscope was                         passed under direct vision. Throughout the procedure,                         the patient's blood pressure, pulse, and oxygen                         saturations were monitored continuously. The Endoscope                         was introduced through the mouth, and advanced to the  third part of duodenum. The upper GI endoscopy was                         accomplished without difficulty. The patient tolerated                         the procedure well. Findings:      The lumen of the esophagus was moderately dilated.      Food was found in the mid esophagus and  in the distal esophagus. liquid       thick food material no solids      No appreciable esophageal motility was noted. In addition, a hypertonic       lower esophageal sphincter was found. There was moderate resistance to       endoscope advancement into the stomach. The gastroesophageal junction       and cardia were normal on retroflexed view. A TTS dilator was passed       through the scope. Dilation with a 15-16.5-18 mm balloon dilator was       performed to 16.5 mm.      The entire examined stomach was normal.      The examined duodenum was normal. Impression:            - Dilation in the entire esophagus.                        - Food in the mid esophagus and in the distal                         esophagus.                        - The examination was suspicious for achalasia.                         Dilated.                        - Normal stomach.                        - Normal examined duodenum.                        - No specimens collected. Recommendation:        - Discharge patient to home (with escort).                        - Resume previous diet.                        - Continue present medications.                        - Refer to DR Nandigam in [place] for esophageal                         manometry                        Obtain barium esophagogram  Keep head end of the bed always elevated at 45 degrees Procedure Code(s):     --- Professional ---                        [phone], Esophagogastroduodenoscopy, flexible,                         transoral; with transendoscopic balloon dilation of                         esophagus (less than 30 mm diameter) Diagnosis Code(s):     --- Professional ---                        K22.8, Other specified diseases of esophagus                        T18.128A, Food in esophagus causing other injury,                         initial encounter                        R93.3, Abnormal findings on diagnostic  imaging of                         other parts of digestive tract CPT copyright 2019 American Medical Association. All rights reserved. The codes documented in this report are preliminary and upon coder review may  be revised to meet current compliance requirements. Jonathon Bellows, MD Jonathon Bellows MD, MD 12/14/2019 8:15:33 AM This report has been signed electronically. Number of Addenda: 0 Note Initiated On: 12/14/2019 7:44 AM Estimated Blood Loss:  Estimated blood loss: none.      [facility]

## 2019-12-14 NOTE — Transfer of Care (Signed)
Immediate Anesthesia Transfer of Care Note  Patient: Kaitlyn Hodges  Procedure(s) Performed: ESOPHAGOGASTRODUODENOSCOPY (EGD) WITH PROPOFOL (N/A )  Patient Location: Endoscopy Unit  Anesthesia Type:General  Level of Consciousness: drowsy and responds to stimulation  Airway & Oxygen Therapy: Patient Spontanous Breathing and Patient connected to face mask oxygen  Post-op Assessment: Report given to RN and Post -op Vital signs reviewed and stable  Post vital signs: Reviewed and stable  Last Vitals:  Vitals Value Taken Time  BP 113/69 12/14/19 0815  Temp 36.4 C 12/14/19 0815  Pulse 64 12/14/19 0816  Resp 15 12/14/19 0816  SpO2 100 % 12/14/19 0816  Vitals shown include unvalidated device data.  Last Pain:  Vitals:   12/14/19 0815  TempSrc: Temporal  PainSc: Asleep         Complications: No apparent anesthesia complications

## 2019-12-14 NOTE — H&P (Signed)
Wyline Mood, MD [address], [place], [place], [address] [address], [place], [place], [address] Phone: [phone]  Fax: [phone]  Primary Care Physician:  Malva Limes, MD   Pre-Procedure History & Physical: HPI:  Kaitlyn Hodges is a 59 y.o. female is here for an endoscopy    Past Medical History:  Diagnosis Date  . Kidney stones   . Stroke [facility])     Past Surgical History:  Procedure Laterality Date  . ABDOMINAL HYSTERECTOMY  2003   Total; Polycystic ovarian Disease    Prior to Admission medications   Medication Sig Start Date End Date Taking? Authorizing Provider  acetaminophen (TYLENOL ARTHRITIS PAIN) 650 MG CR tablet Take 1,300 mg by mouth every 8 (eight) hours.    [provider]  albuterol (VENTOLIN HFA) 108 (90 Base) MCG/ACT inhaler Inhale 1-2 puffs into the lungs every 6 (six) hours as needed for shortness of breath. Patient not taking: Reported on 11/07/2019 12/23/18   Malva Limes, MD  benzonatate (TESSALON) 100 MG capsule Take 1 capsule (100 mg total) by mouth 2 (two) times daily as needed for cough. May cause drowsiness Patient not taking: Reported on 11/07/2019 10/21/19   Malva Limes, MD  benzonatate (TESSALON) 100 MG capsule Take 1 capsule (100 mg total) by mouth 2 (two) times daily as needed for cough. May cause drowsiness 11/08/19   Malva Limes, MD  budesonide-formoterol [facility]) 160-4.5 MCG/ACT inhaler Inhale 2 puffs into the lungs 2 (two) times daily. Patient not taking: Reported on 12/06/2019 11/08/19   Malva Limes, MD  cyclobenzaprine (FLEXERIL) 5 MG tablet Take 1-2 tablets 3 times daily as needed Patient not taking: Reported on 12/23/2018 12/26/17   Enid Derry, PA-C  fluticasone [facility]) 50 MCG/ACT nasal spray Place 2 sprays into both nostrils daily. Patient not taking: Reported on 11/07/2019 01/25/19   Erasmo Downer, MD  predniSONE (DELTASONE) 20 MG tablet Take 60mg  PO daily x 2 days, then40mg   PO daily x 2 days, then 20mg  PO daily x 3 days Patient not taking: Reported on 12/06/2019 11/08/19   02/05/2020, MD    Allergies as of 12/06/2019  . (No Known Allergies)    Family History  Problem Relation Age of Onset  . Breast cancer Mother   . Stroke Mother   . Raynaud syndrome Mother   . Hypertension Father   . Bone cancer Father   . Alcohol abuse Father   . Skin cancer Sister   . Ovarian cancer Maternal Grandmother   . Alcohol abuse Maternal Grandfather   . Skin cancer Paternal Grandmother   . Hypertension Paternal Grandmother   . Heart attack Paternal Grandfather   . Stroke Paternal Grandfather     Social History   Socioeconomic History  . Marital status: Married    Spouse name: Not on file  . Number of children: 1  . Years of education: Not on file  . Highest education level: Not on file  Occupational History  . Occupation: employed    Comment: Works at [employer]  . Smoking status: Current Every Day Smoker    Packs/day: 0.50    Types: Cigarettes  . Smokeless tobacco: Never Used  Substance and Sexual Activity  . Alcohol use: Yes    Alcohol/week: 0.0 standard drinks    Comment: occassional   . Drug use: No  . Sexual activity: Not on file  Other Topics Concern  .  Not on file  Social History Narrative  . Not on file   Social Determinants of Health   Financial Resource Strain:   . Difficulty of Paying Living Expenses:   Food Insecurity:   . Worried About Charity fundraiser in the Last Year:   . Arboriculturist in the Last Year:   Transportation Needs:   . Film/video editor (Medical):   Marland Kitchen Lack of Transportation (Non-Medical):   Physical Activity:   . Days of Exercise per Week:   . Minutes of Exercise per Session:   Stress:   . Feeling of Stress :   Social Connections:   . Frequency of Communication with Friends and Family:   . Frequency of Social Gatherings with Friends and Family:   . Attends Religious Services:     . Active Member of Clubs or Organizations:   . Attends Archivist Meetings:   Marland Kitchen Marital Status:   Intimate Partner Violence:   . Fear of Current or Ex-Partner:   . Emotionally Abused:   Marland Kitchen Physically Abused:   . Sexually Abused:     Review of Systems: See HPI, otherwise negative ROS  Physical Exam: BP (!) 142/79   Pulse 64   Temp 97.6 F (36.4 C) (Temporal)   Resp 10   Ht 5\' 6"  (1.676 m)   Wt 59.9 kg   LMP 02/10/2001   SpO2 97%   BMI 21.31 kg/m  General:   Alert,  pleasant and cooperative in NAD Head:  Normocephalic and atraumatic. Neck:  Supple; no masses or thyromegaly. Lungs:  Clear throughout to auscultation, normal respiratory effort.    Heart:  +S1, +S2, Regular rate and rhythm, No edema. Abdomen:  Soft, nontender and nondistended. Normal bowel sounds, without guarding, and without rebound.   Neurologic:  Alert and  oriented x4;  grossly normal neurologically.  Impression/Plan: Kaitlyn Hodges is here for an endoscopy  to be performed for  evaluation of abnormal ct scan findings of esophagus    Risks, benefits, limitations, and alternatives regarding endoscopy have been reviewed with the patient.  Questions have been answered.  All parties agreeable.   Jonathon Bellows, MD  12/14/2019, 10:57 AM

## 2019-12-14 NOTE — Anesthesia Preprocedure Evaluation (Signed)
Anesthesia Evaluation  Patient identified by MRN, date of birth, ID band Patient awake    Reviewed: Allergy & Precautions, NPO status , Patient's Chart, lab work & pertinent test results  Airway Mallampati: II       Dental   Pulmonary Current Smoker,    Pulmonary exam normal        Cardiovascular hypertension, Pt. on medications Normal cardiovascular exam     Neuro/Psych Seizures -,  PSYCHIATRIC DISORDERS Depression CVA    GI/Hepatic Neg liver ROS,   Endo/Other  negative endocrine ROS  Renal/GU negative Renal ROS  negative genitourinary   Musculoskeletal negative musculoskeletal ROS (+)   Abdominal Normal abdominal exam  (+)   Peds negative pediatric ROS (+)  Hematology negative hematology ROS (+)   Anesthesia Other Findings Past Medical History: No date: Kidney stones No date: Stroke [facility])  Reproductive/Obstetrics                             Anesthesia Physical Anesthesia Plan  ASA: III  Anesthesia Plan: General   Post-op Pain Management:    Induction: Intravenous  PONV Risk Score and Plan:   Airway Management Planned: Nasal Cannula  Additional Equipment:   Intra-op Plan:   Post-operative Plan:   Informed Consent: I have reviewed the patients History and Physical, chart, labs and discussed the procedure including the risks, benefits and alternatives for the proposed anesthesia with the patient or authorized representative who has indicated his/her understanding and acceptance.     Dental advisory given  Plan Discussed with: CRNA and Surgeon  Anesthesia Plan Comments:         Anesthesia Quick Evaluation

## 2019-12-14 NOTE — Anesthesia Postprocedure Evaluation (Signed)
Anesthesia Post Note  Patient: Kaitlyn Hodges  Procedure(s) Performed: ESOPHAGOGASTRODUODENOSCOPY (EGD) WITH PROPOFOL (N/A )  Patient location during evaluation: Endoscopy Anesthesia Type: General Level of consciousness: awake and alert and oriented Pain management: pain level controlled Vital Signs Assessment: post-procedure vital signs reviewed and stable Respiratory status: spontaneous breathing Cardiovascular status: blood pressure returned to baseline Anesthetic complications: no     Last Vitals:  Vitals:   12/14/19 0835 12/14/19 0845  BP: 136/75 (!) 142/79  Pulse: 62 64  Resp: 14 10  Temp:    SpO2: 100% 97%    Last Pain:  Vitals:   12/14/19 0845  TempSrc:   PainSc: 0-No pain                 Carrolyn Hilmes

## 2019-12-15 ENCOUNTER — Telehealth: Payer: Self-pay

## 2019-12-15 ENCOUNTER — Encounter: Payer: Self-pay | Admitting: *Deleted

## 2019-12-15 NOTE — Telephone Encounter (Signed)
Called pt to schedule Barium swallow appointment as planned by Dr. Tobi Bastos.  Unable to contact, LVM to return call

## 2019-12-18 ENCOUNTER — Other Ambulatory Visit: Payer: Self-pay

## 2019-12-18 DIAGNOSIS — R933 Abnormal findings on diagnostic imaging of other parts of digestive tract: Secondary | ICD-10-CM

## 2019-12-18 DIAGNOSIS — R1310 Dysphagia, unspecified: Secondary | ICD-10-CM

## 2019-12-18 DIAGNOSIS — Z1159 Encounter for screening for other viral diseases: Secondary | ICD-10-CM

## 2019-12-19 NOTE — Telephone Encounter (Signed)
Called pt again to schedule Barium swallow.  Unable to contact, LVM to return call

## 2019-12-20 NOTE — Telephone Encounter (Signed)
Spoke with pt's husband and was able to schedule pt's appointment.

## 2019-12-26 ENCOUNTER — Telehealth: Payer: Self-pay

## 2019-12-26 ENCOUNTER — Other Ambulatory Visit: Payer: Self-pay

## 2019-12-26 ENCOUNTER — Ambulatory Visit
Admission: RE | Admit: 2019-12-26 | Discharge: 2019-12-26 | Disposition: A | Discharge status: Home / Self Care | Payer: 59 | Source: Ambulatory Visit | Attending: Gastroenterology | Admitting: Gastroenterology

## 2019-12-26 DIAGNOSIS — R933 Abnormal findings on diagnostic imaging of other parts of digestive tract: Secondary | ICD-10-CM

## 2019-12-26 NOTE — Telephone Encounter (Signed)
Dr. Tobi Bastos,  I just received a call from Tiffany in radiology.  Patients esophagus is completed occluded.  Please call radiology to advise. [phone].  Thanks,  [place], [place]

## 2019-12-27 ENCOUNTER — Telehealth (INDEPENDENT_AMBULATORY_CARE_PROVIDER_SITE_OTHER): Payer: 59 | Admitting: Gastroenterology

## 2019-12-27 ENCOUNTER — Telehealth: Payer: Self-pay

## 2019-12-27 ENCOUNTER — Other Ambulatory Visit: Payer: Self-pay

## 2019-12-27 DIAGNOSIS — R933 Abnormal findings on diagnostic imaging of other parts of digestive tract: Secondary | ICD-10-CM | POA: Diagnosis not present

## 2019-12-27 DIAGNOSIS — R1310 Dysphagia, unspecified: Secondary | ICD-10-CM

## 2019-12-27 NOTE — Telephone Encounter (Signed)
Message from Dr Sharlet Salina- "This is a patient with been having dysphagia for some years with deformity recently. Hypertonic LES, dilated proximal esophagus, barium esophagram shows that the barium does not go past the GE junction. She has developed ways to contort her body over the years to get the food going down. She very likely has achalasia. I have referred her to you for esophageal manometry which she is scheduled at the end of May. I am not sure if you would like to see her before the manometry or after the manometry." Message from Dr Lavon Paganini- "  We will bring her in for office visit. May need endoscopic placement of manometry catheter, as it may go down through the EG junction if the pressure is very high."  Called the patient's number. No answer. Left a message in the voicemail to call about this appointment. Presently have her scheduled for 01/09/20 at 3:30 pm. Her manometry is 01/26/20.

## 2019-12-27 NOTE — Progress Notes (Signed)
Kaitlyn Hodges , MD [address]  [address]  [place], [place] [address]  Main: [phone]  Fax: [phone]   Primary Care Physician: Malva Limes, MD  Virtual Visit via Video Note  I connected with patient on 12/27/19 at 10:30 AM EDT by video and verified that I am speaking with the correct person using two identifiers.   I discussed the limitations, risks, security and privacy concerns of performing an evaluation and management service by video  and the availability of in person appointments. I also discussed with the patient that there may be a patient responsible charge related to this service. The patient expressed understanding and agreed to proceed.  Location of Patient: Home Location of Provider: Home Persons involved: Patient and provider only   History of Present Illness: Chief Complaint  Patient presents with  . Follow-up    Discuss Barium swallow result    HPI: Kaitlyn Hodges is a 59 y.o. female   Summary of history :  She was initially referred and seen on 12/06/2019 for an abnormal CT scan of the chest taken in 04/20/2019.She underwent a CT scan on 04/07/2019 rule out a mediastinal mass and demonstrated an esophagus that is extremely dilated and debris-filled nearly to the level of the glottis.  Concern for achalasia or  scleroderma.  There was clustered centrilobular nodularity and groundglass in the dependent left lung base consistent with aspiration.  Recently seen on 11/08/2019 by Dr. Sherrie Mustache for cough and shortness of breath.  She states that she has had issues with swallowing solids and liquids for over 9 years and lost over 250 pounds.  She is a smoker.  No prior GI evaluation.  She had a father as well as a brother who had issues with swallowing and she says it runs in the family.  She has developed a method to contort  her body to help swallow her foods  Interval history 12/06/2019-12/27/2019  12/15/2019 EGD: Lower  esophagus was moderately dilated.  Scant  peristalsis.  Food noted in the mid esophagus as well as the distal esophagus.  This was despite the fact that she was on a clear liquid diet for over 24 hours.  The material was thick food material liquids but no solids.  Hypertonic lower esophageal sphincter noted.  Moderate resistance to the endoscopy advancement.  Through-the-scope dilator was passed and dilated to 16.5 mm with minimal response   12/26/2019: Barium esophagram discussed with the radiologist the barium did not go past the lower esophageal sphincter.  Patient was comfortable and today she is here today to discuss the results.  She feels that the food has passed her esophagus.    Current Outpatient Medications  Medication Sig Dispense Refill  . acetaminophen (TYLENOL ARTHRITIS PAIN) 650 MG CR tablet Take 1,300 mg by mouth every 8 (eight) hours.    Marland Kitchen albuterol (VENTOLIN HFA) 108 (90 Base) MCG/ACT inhaler Inhale 1-2 puffs into the lungs every 6 (six) hours as needed for shortness of breath. (Patient not taking: Reported on 11/07/2019) 1 Inhaler 2  . benzonatate (TESSALON) 100 MG capsule Take 1 capsule (100 mg total) by mouth 2 (two) times daily as needed for cough. May cause drowsiness (Patient not taking: Reported on 11/07/2019) 20 capsule 0  . benzonatate (TESSALON) 100 MG capsule Take 1 capsule (100 mg total) by mouth 2 (two) times daily as needed for cough. May cause drowsiness (Patient not taking: Reported on 12/27/2019) 20 capsule 0  . budesonide-formoterol (SYMBICORT) 160-4.5 MCG/ACT  inhaler Inhale 2 puffs into the lungs 2 (two) times daily. (Patient not taking: Reported on 12/06/2019) 1 Inhaler 3  . cyclobenzaprine (FLEXERIL) 5 MG tablet Take 1-2 tablets 3 times daily as needed (Patient not taking: Reported on 12/23/2018) 20 tablet 0  . fluticasone (FLONASE) 50 MCG/ACT nasal spray Place 2 sprays into both nostrils daily. (Patient not taking: Reported on 11/07/2019) 16 g 0  . predniSONE (DELTASONE) 20 MG tablet Take 60mg  PO daily x 2 days,  then40mg  PO daily x 2 days, then 20mg  PO daily x 3 days (Patient not taking: Reported on 12/06/2019) 13 tablet 0   No current facility-administered medications for this visit.    Allergies as of 12/27/2019  . (No Known Allergies)    Review of Systems:    All systems reviewed and negative except where noted in HPI.  General Appearance:    Alert, cooperative, no distress, appears stated age  Head:    Normocephalic, without obvious abnormality, atraumatic  Eyes:    PERRL, conjunctiva/corneas clear,  Ears:    Grossly normal hearing    Neurologic:  Grossly normal    Observations/Objective:  Labs: CMP     Component Value Date/Time   NA 140 11/04/2018 2316   NA 142 04/19/2012 0000   K 3.5 11/04/2018 2316   CL 105 11/04/2018 2316   CO2 26 11/04/2018 2316   GLUCOSE 106 (H) 11/04/2018 2316   BUN 13 11/04/2018 2316   BUN 20 04/19/2012 0000   CREATININE 0.83 11/04/2018 2316   CALCIUM 8.9 11/04/2018 2316   PROT 7.2 11/04/2018 2316   ALBUMIN 3.6 11/04/2018 2316   AST 13 (L) 11/04/2018 2316   ALT 10 11/04/2018 2316   ALKPHOS 56 11/04/2018 2316   BILITOT 0.5 11/04/2018 2316   GFRNONAA >60 11/04/2018 2316   GFRAA >60 11/04/2018 2316   Lab Results  Component Value Date   WBC 6.3 11/04/2018   HGB 13.4 11/04/2018   HCT 41.9 11/04/2018   MCV 96.3 11/04/2018   PLT 223 11/04/2018    Imaging Studies: DG ESOPHAGUS W SINGLE CM (SOL OR THIN BA)  Result Date: 12/26/2019 CLINICAL DATA:  Difficulty swallowing. Weight loss. Abnormal CT scan. EXAM: ESOPHOGRAM/BARIUM SWALLOW TECHNIQUE: Combined double contrast and single contrast examination performed using a small amount of thick barium. FLUOROSCOPY TIME:  Fluoroscopy Time:  3 minutes 0 seconds. Radiation Exposure Index (if provided by the fluoroscopic device): 38.3 mGy. COMPARISON:  Chest x-ray 05/26/2019.  CT 04/07/2019. FINDINGS: Cervical esophagus is unremarkable. The thoracic esophagus is severely dilated and contains debris. The thoracic  esophagus is obstructed at the gastroesophageal reflux. No barium passed into the stomach. Differential diagnosis includes a distal esophageal stricture or mass. A process such as severe achalasia or scleroderma could present and this fashion. Report phoned the patient's physician to determine patient's disposition. IMPRESSION: Complete distal esophageal obstruction. Esophagus is extremely dilated and contains debris as noted on prior CT of 04/07/2019. Critical Value/emergent results were called by telephone at the time of interpretation on 12/26/2019 at 10:07 am to nurse Dava in endoscopy, who verbally acknowledged these results and is going to inform the patient's physician. Electronically Signed   By: 06/07/2019  Register   On: 12/26/2019 10:09    Assessment and Plan:   Kaitlyn Hodges is a 59 y.o. y/o female here to follow-up for possible achalasia.  Initially referred in April 2020 for an abnormal CT scan of the thorax which showed a distended esophagus.  Plan 1.  Refer to Dr.  Nandigam at [facility] for evaluation of achalasia.  She has already been referred for esophageal manometry .  Likely will require either therapeutic dilation or myotomy.  I have personally discussed with Dr. Silverio Decamp.  Likely may need endoscopic placement of manometric catheter.     The report suggest findings which are concerning for achalasia.  I would suggest that we proceed with endoscopy and based on findings will need a barium esophagogram and esophageal manometry to rule out achalasia.     I discussed the assessment and treatment plan with the patient. The patient was provided an opportunity to ask questions and all were answered. The patient agreed with the plan and demonstrated an understanding of the instructions.   The patient was advised to call back or seek an in-person evaluation if the symptoms worsen or if the condition fails to improve as anticipated.  I provided 20 minutes of face-to-face time during this  encounter.  Dr Jonathon Bellows MD,MRCP [facility]) Gastroenterology/Hepatology Pager: [phone]   Speech recognition software was used to dictate this note.

## 2019-12-27 NOTE — Telephone Encounter (Signed)
Spoke with spouse. He is unable to bring the patient on 01/09/20. States "If I don't go, she won't go."  Appointment moved to 01/15/20 at 9:30 am. New patient letter mailed to them.

## 2020-01-09 ENCOUNTER — Ambulatory Visit: Payer: 59 | Admitting: Gastroenterology

## 2020-01-15 ENCOUNTER — Encounter: Payer: Self-pay | Admitting: Gastroenterology

## 2020-01-15 ENCOUNTER — Other Ambulatory Visit (HOSPITAL_COMMUNITY)
Admission: RE | Admit: 2020-01-15 | Discharge: 2020-01-15 | Disposition: A | Discharge status: Home / Self Care | Payer: 59 | Source: Ambulatory Visit | Attending: Gastroenterology | Admitting: Gastroenterology

## 2020-01-15 ENCOUNTER — Ambulatory Visit (INDEPENDENT_AMBULATORY_CARE_PROVIDER_SITE_OTHER): Payer: 59 | Admitting: Gastroenterology

## 2020-01-15 DIAGNOSIS — R634 Abnormal weight loss: Secondary | ICD-10-CM | POA: Diagnosis not present

## 2020-01-15 DIAGNOSIS — Z01812 Encounter for preprocedural laboratory examination: Secondary | ICD-10-CM | POA: Diagnosis present

## 2020-01-15 DIAGNOSIS — Z20822 Contact with and (suspected) exposure to covid-19: Secondary | ICD-10-CM | POA: Insufficient documentation

## 2020-01-15 DIAGNOSIS — R1310 Dysphagia, unspecified: Secondary | ICD-10-CM | POA: Diagnosis not present

## 2020-01-15 LAB — SARS CORONAVIRUS 2 (TAT 6-24 HRS): SARS Coronavirus 2: NEGATIVE

## 2020-01-15 NOTE — Progress Notes (Signed)
         Kaitlyn Hodges    [id number]    07/08/1961  Primary Care Physician:Fisher, Donald E, MD  Referring Physician: Fisher, Donald E, MD [address] [place],  [place] [address]   Chief complaint: Dysphagia HPI: 58-year-old female here for new patient visit for second opinion, consultation and further management of worsening dysphagia/possible achalasia, referred by Dr. Anna at [facility]  Patient states she always had difficulty swallowing since she was a baby, multiple members in her family have swallowing issues including her father and brother  Her symptoms are worse after she had stroke in 2009 and have gotten a lot worse in the past 2 months.  She has lost almost 250 to 300 pounds in the past few years  She is not tolerating p.o. intake, has not been able to eat any solids in the past 2 months.  Currently able to drink liquids intermittently, also regurgitates liquids multiple times throughout the day  She has hoarseness of voice, chronic cough and recurrent hospitalization with aspiration pneumonia  EGD December 14, 2019 by Dr. Anna: Residual food debris in mid and lower esophagus, moderate resistance passing the scope through EG junction.  Dilated with TTS balloon to 18 mm.  Findings suspicious for achalasia   Barium esophagram December 26, 2019: Dilated esophagus with debris.  Contrast did not pass through EG junction   CT chest without contrast April 07, 2019: 1. The esophagus is extremely dilated and debris filled nearly to the level of the glottis in the included lower neck. Findings correspond to abnormality on prior chest radiograph and differential considerations include achalasia and scleroderma.  2. Clustered centrilobular nodularity and ground-glass in the dependent left lung base, consistent with aspiration.  3.  Coronary artery disease and aortic atherosclerosis.  Outpatient Encounter Medications as of 01/15/2020  Medication Sig  .  acetaminophen (TYLENOL ARTHRITIS PAIN) 650 MG CR tablet Take 1,300 mg by mouth every 8 (eight) hours.  . albuterol (VENTOLIN HFA) 108 (90 Base) MCG/ACT inhaler Inhale 1-2 puffs into the lungs every 6 (six) hours as needed for shortness of breath. (Patient not taking: Reported on 11/07/2019)  . budesonide-formoterol (SYMBICORT) 160-4.5 MCG/ACT inhaler Inhale 2 puffs into the lungs 2 (two) times daily. (Patient not taking: Reported on 12/06/2019)  . [DISCONTINUED] benzonatate (TESSALON) 100 MG capsule Take 1 capsule (100 mg total) by mouth 2 (two) times daily as needed for cough. May cause drowsiness (Patient not taking: Reported on 11/07/2019)  . [DISCONTINUED] benzonatate (TESSALON) 100 MG capsule Take 1 capsule (100 mg total) by mouth 2 (two) times daily as needed for cough. May cause drowsiness (Patient not taking: Reported on 12/27/2019)  . [DISCONTINUED] cyclobenzaprine (FLEXERIL) 5 MG tablet Take 1-2 tablets 3 times daily as needed (Patient not taking: Reported on 12/23/2018)  . [DISCONTINUED] fluticasone (FLONASE) 50 MCG/ACT nasal spray Place 2 sprays into both nostrils daily. (Patient not taking: Reported on 11/07/2019)  . [DISCONTINUED] predniSONE (DELTASONE) 20 MG tablet Take 60mg PO daily x 2 days, then40mg PO daily x 2 days, then 20mg PO daily x 3 days (Patient not taking: Reported on 12/06/2019)   No facility-administered encounter medications on file as of 01/15/2020.    Allergies as of 01/15/2020  . (No Known Allergies)    Past Medical History:  Diagnosis Date  . Kidney stones   . Stroke (HCC)     Past Surgical History:  Procedure Laterality Date  . ABDOMINAL HYSTERECTOMY  2003   Total;   Polycystic ovarian Disease  . ESOPHAGOGASTRODUODENOSCOPY (EGD) WITH PROPOFOL N/A 12/14/2019   Procedure: ESOPHAGOGASTRODUODENOSCOPY (EGD) WITH PROPOFOL;  Surgeon: Wyline Mood, MD;  Location: [facility] ENDOSCOPY;  Service: Gastroenterology;  Laterality: N/A;    Family History  Problem Relation Age of Onset    . Breast cancer Mother   . Stroke Mother   . Raynaud syndrome Mother   . Hypertension Father   . Bone cancer Father   . Alcohol abuse Father   . Skin cancer Sister   . Ovarian cancer Maternal Grandmother   . Alcohol abuse Maternal Grandfather   . Skin cancer Paternal Grandmother   . Hypertension Paternal Grandmother   . Heart attack Paternal Grandfather   . Stroke Paternal Grandfather     Social History   Socioeconomic History  . Marital status: Married    Spouse name: Not on file  . Number of children: 1  . Years of education: Not on file  . Highest education level: Not on file  Occupational History  . Occupation: employed    Comment: Works at [employer]  . Smoking status: Current Every Day Smoker    Packs/day: 0.50    Types: Cigarettes  . Smokeless tobacco: Never Used  Substance and Sexual Activity  . Alcohol use: Yes    Alcohol/week: 0.0 standard drinks    Comment: occassional   . Drug use: No  . Sexual activity: Not on file  Other Topics Concern  . Not on file  Social History Narrative  . Not on file   Social Determinants of Health   Financial Resource Strain:   . Difficulty of Paying Living Expenses:   Food Insecurity:   . Worried About Programme researcher, broadcasting/film/video in the Last Year:   . Barista in the Last Year:   Transportation Needs:   . Freight forwarder (Medical):   Marland Kitchen Lack of Transportation (Non-Medical):   Physical Activity:   . Days of Exercise per Week:   . Minutes of Exercise per Session:   Stress:   . Feeling of Stress :   Social Connections:   . Frequency of Communication with Friends and Family:   . Frequency of Social Gatherings with Friends and Family:   . Attends Religious Services:   . Active Member of Clubs or Organizations:   . Attends Banker Meetings:   Marland Kitchen Marital Status:   Intimate Partner Violence:   . Fear of Current or Ex-Partner:   . Emotionally Abused:   Marland Kitchen Physically Abused:   .  Sexually Abused:       Review of systems: All other review of systems negative except as mentioned in the HPI.   Physical Exam: Vitals:   01/15/20 0857  BP: 128/78  Pulse: 68  Temp: 98 F (36.7 C)   Body mass index is 23.56 kg/m. Gen:      No acute distress HEENT:  EOMI, sclera anicteric Neck:     No masses; no thyromegaly Abd:       soft, non-tender; no palpable masses, no distension Neuro: alert and oriented x 3 Psych: normal mood and affect  Data Reviewed:  Reviewed labs, radiology imaging, old records and pertinent past GI work up   Assessment and Plan/Recommendations:  59 year old female with history of hypertension, seizure disorder, s/p stroke with progressive dysphagia, regurgitation and significant weight loss, CT, barium swallow and EGD suspicious for possible achalasia  Scheduled for esophageal manometry to confirm diagnosis of achalasia and  also to help with future management  Plan for endoscopic placement of esophageal manometry catheter, as unlikely the catheter will pass through EG junction without endoscopic assistance  Discussed different modalities for management of achalasia including pneumatic dilation, POEM and Heller myotomy.  If type I or type III achalasia patient will benefit from plan or extended myotomy  Further recommendations based on findings of esophageal manometry  This visit required 65 minutes of patient care (this includes precharting, chart review, review of results, face-to-face time used for counseling as well as treatment plan and follow-up. The patient was provided an opportunity to ask questions and all were answered. The patient agreed with the plan and demonstrated an understanding of the instructions.  Damaris Hippo , MD    CC: Birdie Sons, MD

## 2020-01-15 NOTE — H&P (View-Only) (Signed)
Kaitlyn Hodges    [id number]    1961/02/05  Primary Care Physician:Fisher, Demetrios Isaacs, MD  Referring Physician: Malva Limes, MD [address] [place],  [place] [address]   Chief complaint: Dysphagia HPI: 59 year old female here for new patient visit for second opinion, consultation and further management of worsening dysphagia/possible achalasia, referred by Dr. Tobi Bastos at [facility]  Patient states she always had difficulty swallowing since she was a baby, multiple members in her family have swallowing issues including her father and brother  Her symptoms are worse after she had stroke in 2009 and have gotten a lot worse in the past 2 months.  She has lost almost 250 to 300 pounds in the past few years  She is not tolerating p.o. intake, has not been able to eat any solids in the past 2 months.  Currently able to drink liquids intermittently, also regurgitates liquids multiple times throughout the day  She has hoarseness of voice, chronic cough and recurrent hospitalization with aspiration pneumonia  EGD December 14, 2019 by Dr. Tobi Bastos: Residual food debris in mid and lower esophagus, moderate resistance passing the scope through EG junction.  Dilated with TTS balloon to 18 mm.  Findings suspicious for achalasia   Barium esophagram December 26, 2019: Dilated esophagus with debris.  Contrast did not pass through EG junction   CT chest without contrast April 07, 2019: 1. The esophagus is extremely dilated and debris filled nearly to the level of the glottis in the included lower neck. Findings correspond to abnormality on prior chest radiograph and differential considerations include achalasia and scleroderma.  2. Clustered centrilobular nodularity and ground-glass in the dependent left lung base, consistent with aspiration.  3.  Coronary artery disease and aortic atherosclerosis.  Outpatient Encounter Medications as of 01/15/2020  Medication Sig  .  acetaminophen (TYLENOL ARTHRITIS PAIN) 650 MG CR tablet Take 1,300 mg by mouth every 8 (eight) hours.  Marland Kitchen albuterol (VENTOLIN HFA) 108 (90 Base) MCG/ACT inhaler Inhale 1-2 puffs into the lungs every 6 (six) hours as needed for shortness of breath. (Patient not taking: Reported on 11/07/2019)  . budesonide-formoterol (SYMBICORT) 160-4.5 MCG/ACT inhaler Inhale 2 puffs into the lungs 2 (two) times daily. (Patient not taking: Reported on 12/06/2019)  . [DISCONTINUED] benzonatate (TESSALON) 100 MG capsule Take 1 capsule (100 mg total) by mouth 2 (two) times daily as needed for cough. May cause drowsiness (Patient not taking: Reported on 11/07/2019)  . [DISCONTINUED] benzonatate (TESSALON) 100 MG capsule Take 1 capsule (100 mg total) by mouth 2 (two) times daily as needed for cough. May cause drowsiness (Patient not taking: Reported on 12/27/2019)  . [DISCONTINUED] cyclobenzaprine (FLEXERIL) 5 MG tablet Take 1-2 tablets 3 times daily as needed (Patient not taking: Reported on 12/23/2018)  . [DISCONTINUED] fluticasone (FLONASE) 50 MCG/ACT nasal spray Place 2 sprays into both nostrils daily. (Patient not taking: Reported on 11/07/2019)  . [DISCONTINUED] predniSONE (DELTASONE) 20 MG tablet Take 60mg  PO daily x 2 days, then40mg  PO daily x 2 days, then 20mg  PO daily x 3 days (Patient not taking: Reported on 12/06/2019)   No facility-administered encounter medications on file as of 01/15/2020.    Allergies as of 01/15/2020  . (No Known Allergies)    Past Medical History:  Diagnosis Date  . Kidney stones   . Stroke [facility])     Past Surgical History:  Procedure Laterality Date  . ABDOMINAL HYSTERECTOMY  2003   Total;  Polycystic ovarian Disease  . ESOPHAGOGASTRODUODENOSCOPY (EGD) WITH PROPOFOL N/A 12/14/2019   Procedure: ESOPHAGOGASTRODUODENOSCOPY (EGD) WITH PROPOFOL;  Surgeon: Wyline Mood, MD;  Location: [facility] ENDOSCOPY;  Service: Gastroenterology;  Laterality: N/A;    Family History  Problem Relation Age of Onset    . Breast cancer Mother   . Stroke Mother   . Raynaud syndrome Mother   . Hypertension Father   . Bone cancer Father   . Alcohol abuse Father   . Skin cancer Sister   . Ovarian cancer Maternal Grandmother   . Alcohol abuse Maternal Grandfather   . Skin cancer Paternal Grandmother   . Hypertension Paternal Grandmother   . Heart attack Paternal Grandfather   . Stroke Paternal Grandfather     Social History   Socioeconomic History  . Marital status: Married    Spouse name: Not on file  . Number of children: 1  . Years of education: Not on file  . Highest education level: Not on file  Occupational History  . Occupation: employed    Comment: Works at [employer]  . Smoking status: Current Every Day Smoker    Packs/day: 0.50    Types: Cigarettes  . Smokeless tobacco: Never Used  Substance and Sexual Activity  . Alcohol use: Yes    Alcohol/week: 0.0 standard drinks    Comment: occassional   . Drug use: No  . Sexual activity: Not on file  Other Topics Concern  . Not on file  Social History Narrative  . Not on file   Social Determinants of Health   Financial Resource Strain:   . Difficulty of Paying Living Expenses:   Food Insecurity:   . Worried About Programme researcher, broadcasting/film/video in the Last Year:   . Barista in the Last Year:   Transportation Needs:   . Freight forwarder (Medical):   Marland Kitchen Lack of Transportation (Non-Medical):   Physical Activity:   . Days of Exercise per Week:   . Minutes of Exercise per Session:   Stress:   . Feeling of Stress :   Social Connections:   . Frequency of Communication with Friends and Family:   . Frequency of Social Gatherings with Friends and Family:   . Attends Religious Services:   . Active Member of Clubs or Organizations:   . Attends Banker Meetings:   Marland Kitchen Marital Status:   Intimate Partner Violence:   . Fear of Current or Ex-Partner:   . Emotionally Abused:   Marland Kitchen Physically Abused:   .  Sexually Abused:       Review of systems: All other review of systems negative except as mentioned in the HPI.   Physical Exam: Vitals:   01/15/20 0857  BP: 128/78  Pulse: 68  Temp: 98 F (36.7 C)   Body mass index is 23.56 kg/m. Gen:      No acute distress HEENT:  EOMI, sclera anicteric Neck:     No masses; no thyromegaly Abd:       soft, non-tender; no palpable masses, no distension Neuro: alert and oriented x 3 Psych: normal mood and affect  Data Reviewed:  Reviewed labs, radiology imaging, old records and pertinent past GI work up   Assessment and Plan/Recommendations:  59 year old female with history of hypertension, seizure disorder, s/p stroke with progressive dysphagia, regurgitation and significant weight loss, CT, barium swallow and EGD suspicious for possible achalasia  Scheduled for esophageal manometry to confirm diagnosis of achalasia and  also to help with future management  Plan for endoscopic placement of esophageal manometry catheter, as unlikely the catheter will pass through EG junction without endoscopic assistance  Discussed different modalities for management of achalasia including pneumatic dilation, POEM and Heller myotomy.  If type I or type III achalasia patient will benefit from plan or extended myotomy  Further recommendations based on findings of esophageal manometry  This visit required 65 minutes of patient care (this includes precharting, chart review, review of results, face-to-face time used for counseling as well as treatment plan and follow-up. The patient was provided an opportunity to ask questions and all were answered. The patient agreed with the plan and demonstrated an understanding of the instructions.  Damaris Hippo , MD    CC: Birdie Sons, MD

## 2020-01-15 NOTE — Patient Instructions (Signed)
If you are age 60 or older, your body mass index should be between 23-30. Your Body mass index is 23.56 kg/m. If this is out of the aforementioned range listed, please consider follow up with your Primary Care Provider.  If you are age 41 or younger, your body mass index should be between 19-25. Your Body mass index is 23.56 kg/m. If this is out of the aformentioned range listed, please consider follow up with your Primary Care Provider.     I appreciate the opportunity to care for you. Thank you for choosing me and [facility],  Dr. Marsa Aris

## 2020-01-17 ENCOUNTER — Ambulatory Visit (HOSPITAL_COMMUNITY)
Admission: RE | Admit: 2020-01-17 | Discharge: 2020-01-17 | Disposition: A | Discharge status: Home / Self Care | Payer: 59 | Attending: Gastroenterology | Admitting: Gastroenterology

## 2020-01-17 ENCOUNTER — Encounter (HOSPITAL_COMMUNITY)
Admission: RE | Disposition: A | Discharge status: Home / Self Care | Payer: Self-pay | Source: Home / Self Care | Attending: Gastroenterology

## 2020-01-17 ENCOUNTER — Encounter (HOSPITAL_COMMUNITY): Payer: Self-pay | Admitting: Gastroenterology

## 2020-01-17 ENCOUNTER — Other Ambulatory Visit: Payer: Self-pay

## 2020-01-17 ENCOUNTER — Ambulatory Visit (HOSPITAL_COMMUNITY): Admit: 2020-01-17 | Payer: 59 | Admitting: Gastroenterology

## 2020-01-17 ENCOUNTER — Ambulatory Visit (HOSPITAL_COMMUNITY): Payer: 59 | Admitting: Certified Registered Nurse Anesthetist

## 2020-01-17 DIAGNOSIS — T18128A Food in esophagus causing other injury, initial encounter: Secondary | ICD-10-CM | POA: Diagnosis not present

## 2020-01-17 DIAGNOSIS — R1319 Other dysphagia: Secondary | ICD-10-CM

## 2020-01-17 DIAGNOSIS — X58XXXA Exposure to other specified factors, initial encounter: Secondary | ICD-10-CM | POA: Insufficient documentation

## 2020-01-17 DIAGNOSIS — Z8673 Personal history of transient ischemic attack (TIA), and cerebral infarction without residual deficits: Secondary | ICD-10-CM | POA: Diagnosis not present

## 2020-01-17 DIAGNOSIS — I10 Essential (primary) hypertension: Secondary | ICD-10-CM | POA: Insufficient documentation

## 2020-01-17 DIAGNOSIS — K2210 Ulcer of esophagus without bleeding: Secondary | ICD-10-CM | POA: Diagnosis not present

## 2020-01-17 DIAGNOSIS — R1310 Dysphagia, unspecified: Secondary | ICD-10-CM

## 2020-01-17 DIAGNOSIS — F17210 Nicotine dependence, cigarettes, uncomplicated: Secondary | ICD-10-CM | POA: Insufficient documentation

## 2020-01-17 HISTORY — PX: BIOPSY: SHX5522

## 2020-01-17 HISTORY — PX: FOREIGN BODY REMOVAL: SHX962

## 2020-01-17 HISTORY — PX: ESOPHAGOGASTRODUODENOSCOPY (EGD) WITH PROPOFOL: SHX5813

## 2020-01-17 SURGERY — ESOPHAGOGASTRODUODENOSCOPY (EGD) WITH PROPOFOL
Anesthesia: Monitor Anesthesia Care

## 2020-01-17 SURGERY — CANCELLED PROCEDURE

## 2020-01-17 SURGERY — MANOMETRY, ESOPHAGUS

## 2020-01-17 MED ORDER — ONDANSETRON HCL 4 MG/2ML IJ SOLN
INTRAMUSCULAR | Status: DC | PRN
  Administered 2020-01-17: 4 mg via INTRAVENOUS

## 2020-01-17 MED ORDER — DEXAMETHASONE SODIUM PHOSPHATE 10 MG/ML IJ SOLN
INTRAMUSCULAR | Status: DC | PRN
  Administered 2020-01-17: 10 mg via INTRAVENOUS

## 2020-01-17 MED ORDER — LACTATED RINGERS IV SOLN: INTRAVENOUS | Status: DC

## 2020-01-17 MED ORDER — LIDOCAINE 2% (20 MG/ML) 5 ML SYRINGE
INTRAMUSCULAR | Status: DC | PRN
  Administered 2020-01-17: 100 mg via INTRAVENOUS

## 2020-01-17 MED ORDER — PROPOFOL 500 MG/50ML IV EMUL
INTRAVENOUS | Status: AC
  Filled 2020-01-17: qty 50

## 2020-01-17 MED ORDER — PHENYLEPHRINE 40 MCG/ML (10ML) SYRINGE FOR IV PUSH (FOR BLOOD PRESSURE SUPPORT)
PREFILLED_SYRINGE | INTRAVENOUS | Status: DC | PRN
  Administered 2020-01-17 (×3): 80 ug via INTRAVENOUS

## 2020-01-17 MED ORDER — ONDANSETRON HCL 4 MG/2ML IJ SOLN: 4.0000 mg | Freq: Once | INTRAMUSCULAR | Status: DC | PRN

## 2020-01-17 MED ORDER — PROPOFOL 500 MG/50ML IV EMUL
INTRAVENOUS | Status: DC | PRN
  Administered 2020-01-17: 150 ug/kg/min via INTRAVENOUS

## 2020-01-17 MED ORDER — LACTATED RINGERS IV SOLN: INTRAVENOUS | Status: DC | PRN

## 2020-01-17 MED ORDER — SUCRALFATE 1 GM/10ML PO SUSP
1.0000 g | Freq: Four times a day (QID) | ORAL | 1 refills | Status: DC
Start: 2020-01-17 — End: 2020-01-18

## 2020-01-17 MED ORDER — SUGAMMADEX SODIUM 200 MG/2ML IV SOLN
INTRAVENOUS | Status: DC | PRN
  Administered 2020-01-17: 100 mg via INTRAVENOUS

## 2020-01-17 MED ORDER — ROCURONIUM BROMIDE 10 MG/ML (PF) SYRINGE
PREFILLED_SYRINGE | INTRAVENOUS | Status: DC | PRN
  Administered 2020-01-17: 20 mg via INTRAVENOUS

## 2020-01-17 MED ORDER — SUCCINYLCHOLINE CHLORIDE 200 MG/10ML IV SOSY
PREFILLED_SYRINGE | INTRAVENOUS | Status: DC | PRN
  Administered 2020-01-17: 100 mg via INTRAVENOUS

## 2020-01-17 MED ORDER — EPHEDRINE SULFATE-NACL 50-0.9 MG/10ML-% IV SOSY
PREFILLED_SYRINGE | INTRAVENOUS | Status: DC | PRN
  Administered 2020-01-17 (×3): 10 mg via INTRAVENOUS

## 2020-01-17 MED ORDER — PROPOFOL 10 MG/ML IV BOLUS
INTRAVENOUS | Status: DC | PRN
  Administered 2020-01-17: 40 mg via INTRAVENOUS
  Administered 2020-01-17: 80 mg via INTRAVENOUS

## 2020-01-17 MED ORDER — GLYCOPYRROLATE PF 0.2 MG/ML IJ SOSY
PREFILLED_SYRINGE | INTRAMUSCULAR | Status: DC | PRN
  Administered 2020-01-17: .2 mg via INTRAVENOUS

## 2020-01-17 MED ORDER — FENTANYL CITRATE (PF) 100 MCG/2ML IJ SOLN: 25.0000 ug | INTRAMUSCULAR | Status: DC | PRN

## 2020-01-17 SURGICAL SUPPLY — 15 items

## 2020-01-17 SURGICAL SUPPLY — 2 items
FACESHIELD LNG OPTICON STERILE (SAFETY) IMPLANT
GLOVE BIO SURGEON STRL SZ8 (GLOVE) ×6 IMPLANT

## 2020-01-17 NOTE — Anesthesia Postprocedure Evaluation (Signed)
Anesthesia Post Note  Patient: Kaitlyn Hodges  Procedure(s) Performed: ESOPHAGOGASTRODUODENOSCOPY (EGD) WITH PROPOFOL (N/A ) FOREIGN BODY REMOVAL BIOPSY     Patient location during evaluation: PACU Anesthesia Type: General Level of consciousness: awake and alert and oriented Pain management: pain level controlled Vital Signs Assessment: post-procedure vital signs reviewed and stable Respiratory status: spontaneous breathing, nonlabored ventilation and respiratory function stable Cardiovascular status: blood pressure returned to baseline Postop Assessment: no apparent nausea or vomiting Anesthetic complications: no    Last Vitals:  Vitals:   01/17/20 1502 01/17/20 1514  BP: (!) 171/86 (!) 171/84  Pulse: 64 74  Resp: 11 17  Temp:  (!) 35.3 C  SpO2: 100% 98%    Last Pain:  Vitals:   01/17/20 1514  TempSrc: Temporal  PainSc: 0-No pain                 [name]

## 2020-01-17 NOTE — Transfer of Care (Signed)
Immediate Anesthesia Transfer of Care Note  Patient: Kaitlyn Hodges  Procedure(s) Performed: ESOPHAGOGASTRODUODENOSCOPY (EGD) WITH PROPOFOL (N/A ) FOREIGN BODY REMOVAL BIOPSY  Patient Location: Endoscopy Unit  Anesthesia Type:General  Level of Consciousness: awake, alert , oriented and patient cooperative  Airway & Oxygen Therapy: Patient Spontanous Breathing and Patient connected to face mask oxygen  Post-op Assessment: Report given to RN, Post -op Vital signs reviewed and stable and Patient moving all extremities  Post vital signs: Reviewed and stable  Last Vitals:  Vitals Value Taken Time  BP 171/86 01/17/20 1502  Temp    Pulse 65 01/17/20 1502  Resp 16 01/17/20 1504  SpO2 100 % 01/17/20 1502  Vitals shown include unvalidated device data.  Last Pain:  Vitals:   01/17/20 1502  TempSrc:   PainSc: 0-No pain         Complications: No apparent anesthesia complications

## 2020-01-17 NOTE — Discharge Instructions (Signed)
YOU HAD AN ENDOSCOPIC PROCEDURE TODAY: Refer to the procedure report and other information in the discharge instructions given to you for any specific questions about what was found during the examination. If this information does not answer your questions, please call [place] office at [phone] to clarify.   YOU SHOULD EXPECT: Some feelings of bloating in the abdomen. Passage of more gas than usual. Walking can help get rid of the air that was put into your GI tract during the procedure and reduce the bloating. If you had a lower endoscopy (such as a colonoscopy or flexible sigmoidoscopy) you may notice spotting of blood in your stool or on the toilet paper. Some abdominal soreness may be present for a day or two, also.  DIET: ONLY LIQUIDS  ACTIVITY: Your care partner should take you home directly after the procedure. You should plan to take it easy, moving slowly for the rest of the day. You can resume normal activity the day after the procedure however YOU SHOULD NOT DRIVE, use power tools, machinery or perform tasks that involve climbing or major physical exertion for 24 hours (because of the sedation medicines used during the test).   SYMPTOMS TO REPORT IMMEDIATELY: A gastroenterologist can be reached at any hour. Please call [phone]  for any of the following symptoms:  Following upper endoscopy (EGD, EUS, ERCP, esophageal dilation) Vomiting of blood or coffee ground material  New, significant abdominal pain  New, significant chest pain or pain under the shoulder blades  Painful or persistently difficult swallowing  New shortness of breath  Black, tarry-looking or red, bloody stools  FOLLOW UP:  If any biopsies were taken you will be contacted by phone or by letter within the next 1-3 weeks. Call [phone]  if you have not heard about the biopsies in 3 weeks.  Please also call with any specific questions about appointments or follow up tests.

## 2020-01-17 NOTE — Op Note (Addendum)
[facility] Patient Name: Kaitlyn Hodges Procedure Date: 01/17/2020 MRN: [id number] Attending MD: Napoleon Form , MD Date of Birth: Oct 29, 1960 CSN: [id number] Age: 59 Admit Type: Outpatient Procedure:                Upper GI endoscopy Indications:              Dysphagia Providers:                Napoleon Form, MD, Cathlean Marseilles, RN, Kandice Robinsons, Technician Referring MD:              Medicines:                General Anesthesia Complications:            No immediate complications. Estimated Blood Loss:     Estimated blood loss was minimal. Procedure:                Pre-Anesthesia Assessment:                           - Prior to the procedure, a History and Physical                            was performed, and patient medications and                            allergies were reviewed. The patient's tolerance of                            previous anesthesia was also reviewed. The risks                            and benefits of the procedure and the sedation                            options and risks were discussed with the patient.                            All questions were answered, and informed consent                            was obtained. Prior Anticoagulants: The patient has                            taken no previous anticoagulant or antiplatelet                            agents. ASA Grade Assessment: III - A patient with                            severe systemic disease. After reviewing the risks  and benefits, the patient was deemed in                            satisfactory condition to undergo the procedure.                           After obtaining informed consent, the endoscope was                            passed under direct vision. Throughout the                            procedure, the patient's blood pressure, pulse, and                            oxygen saturations were  monitored continuously. The                            GIF-H190 ([id number]) Olympus gastroscope was                            introduced through the mouth, and advanced to the                            second part of duodenum. The upper GI endoscopy was                            extremely difficult due to abnormal anatomy and                            presence of food. Successful completion of the                            procedure was aided by withdrawing the scope and                            replacing with the dual channel endoscope. The                            patient tolerated the procedure well. Scope In: Scope Out: Findings:      The lumen of the esophagus was severely dilated with large amount of       solid food impacted through out the esophagus. The scope was withdrawn       and replaced with the dual channel endoscope in order to safely remove       residual food debris from the esophagus.      Removal of food was accomplished with roth net,large bore gastric lavage       tube placement and suction. Spent >90 minutes      Moderately severe esophagitis was found 25 to 40 cm from the incisors.       Biopsies were taken with a cold forceps for histology.      The stomach was normal.      The cardia and gastric fundus were normal on retroflexion.  The examined duodenum was normal.      The gastroesophageal junction was significantly tortuous and tight, no       obvious stricture but did have significant resistance advancing the       scope through the EG junction. A manometry was placed at 52 cm from the       left naris. A esophageal manometry catheter was placed through the nares       into the esophagus. Under endoscopic guidance, the tube was advanced       into the stomach. Placement was confirmed by scope visualization. Impression:               - Dilation in the entire esophagus with tortous and                            tight EG junction suspicious for  achalasia.                           - Food in the esophagus. Removal was successful.                           - Moderately severe erosive esophagitis. Biopsied.                           - Normal stomach.                           - Normal examined duodenum.                           - A esophageal manometry catheter was placed into                            the stomach. Moderate Sedation:      Not Applicable - Patient had care per Anesthesia. Recommendation:           - Patient has a contact number available for                            emergencies. The signs and symptoms of potential                            delayed complications were discussed with the                            patient. Return to normal activities tomorrow.                            Written discharge instructions were provided to the                            patient.                           - Full liquid diet.                           -  Continue present medications.                           - Await pathology results.                           - Follow up esophageal manometry report                           - Surgical referral pending manometry findings Procedure Code(s):        --- Professional ---                           [id number], 22, Esophagogastroduodenoscopy, flexible,                            transoral; with removal of foreign body(s)                           43241, Esophagogastroduodenoscopy, flexible,                            transoral; with insertion of intraluminal tube or                            catheter                           43239, Esophagogastroduodenoscopy, flexible,                            transoral; with biopsy, single or multiple Diagnosis Code(s):        --- Professional ---                           K22.8, Other specified diseases of esophagus                           T18.128A, Food in esophagus causing other injury,                            initial encounter                            K20.80, Other esophagitis without bleeding                           Q39.9, Congenital malformation of esophagus,                            unspecified                           R13.10, Dysphagia, unspecified CPT copyright 2019 American Medical Association. All rights reserved. The codes documented in this report are preliminary and upon coder review may  be revised to meet current compliance requirements. Mauri Pole, MD 01/17/2020 3:09:22 PM This report has been signed electronically. Number of Addenda: 0

## 2020-01-17 NOTE — Interval H&P Note (Signed)
History and Physical Interval Note:  01/17/2020 12:16 PM  Kaitlyn Hodges  has presented today for surgery, with the diagnosis of dysphagia, weight loss.  The various methods of treatment have been discussed with the patient and family. After consideration of risks, benefits and other options for treatment, the patient has consented to  Procedure(s): ESOPHAGOGASTRODUODENOSCOPY (EGD) WITH PROPOFOL (N/A) as a surgical intervention.  The patient's history has been reviewed, patient examined, no change in status, stable for surgery.  I have reviewed the patient's chart and labs.  Questions were answered to the patient's satisfaction.     Cerys Winget

## 2020-01-17 NOTE — Anesthesia Preprocedure Evaluation (Addendum)
Anesthesia Evaluation  Patient identified by MRN, date of birth, ID band Patient awake    Reviewed: Allergy & Precautions, NPO status , Patient's Chart, lab work & pertinent test results  History of Anesthesia Complications Negative for: history of anesthetic complications  Airway Mallampati: II  TM Distance: >3 FB Neck ROM: Full    Dental  (+) Missing, Poor Dentition, Chipped,    Pulmonary Current Smoker,    Pulmonary exam normal        Cardiovascular negative cardio ROS Normal cardiovascular exam     Neuro/Psych Seizures -, Well Controlled,  Depression CVA (2008)    GI/Hepatic negative GI ROS, Neg liver ROS,   Endo/Other  negative endocrine ROS  Renal/GU negative Renal ROS  negative genitourinary   Musculoskeletal negative musculoskeletal ROS (+)   Abdominal   Peds  Hematology negative hematology ROS (+)   Anesthesia Other Findings Day of surgery medications reviewed with patient.  Reproductive/Obstetrics negative OB ROS                            Anesthesia Physical Anesthesia Plan  ASA: III  Anesthesia Plan: MAC   Post-op Pain Management:    Induction:   PONV Risk Score and Plan: 1 and Treatment may vary due to age or medical condition and Propofol infusion  Airway Management Planned: Natural Airway and Nasal Cannula  Additional Equipment: None  Intra-op Plan:   Post-operative Plan:   Informed Consent: I have reviewed the patients History and Physical, chart, labs and discussed the procedure including the risks, benefits and alternatives for the proposed anesthesia with the patient or authorized representative who has indicated his/her understanding and acceptance.       Plan Discussed with: CRNA  Anesthesia Plan Comments:        Anesthesia Quick Evaluation

## 2020-01-17 NOTE — Anesthesia Procedure Notes (Addendum)
Procedure Name: Intubation Date/Time: 01/17/2020 12:41 PM Performed by: Mitzie Na, CRNA Pre-anesthesia Checklist: Patient identified, Emergency Drugs available, Suction available and Patient being monitored Patient Re-evaluated:Patient Re-evaluated prior to induction Oxygen Delivery Method: Circle system utilized Preoxygenation: Pre-oxygenation with 100% oxygen Induction Type: IV induction, Rapid sequence and Cricoid Pressure applied Laryngoscope Size: Mac and 3 Grade View: Grade I Tube type: Oral Tube size: 7.0 mm Number of attempts: 1 Airway Equipment and Method: Stylet Placement Confirmation: ETT inserted through vocal cords under direct vision,  positive ETCO2 and breath sounds checked- equal and bilateral Secured at: 24 cm Tube secured with: Tape Dental Injury: Teeth and Oropharynx as per pre-operative assessment

## 2020-01-18 ENCOUNTER — Ambulatory Visit (HOSPITAL_COMMUNITY): Admit: 2020-01-18 | Payer: 59 | Admitting: Gastroenterology

## 2020-01-18 ENCOUNTER — Telehealth: Payer: Self-pay

## 2020-01-18 ENCOUNTER — Ambulatory Visit: Payer: 59 | Admitting: Gastroenterology

## 2020-01-18 ENCOUNTER — Encounter: Payer: Self-pay | Admitting: *Deleted

## 2020-01-18 ENCOUNTER — Telehealth: Payer: Self-pay | Admitting: Gastroenterology

## 2020-01-18 MED ORDER — SUCRALFATE 1 G PO TABS
1.0000 g | ORAL_TABLET | Freq: Three times a day (TID) | ORAL | 1 refills | Status: DC
Start: 2020-01-18 — End: 2021-03-03

## 2020-01-18 NOTE — Telephone Encounter (Signed)
-----   Message from Napoleon Form, MD sent at 01/18/2020  6:12 AM EDT ----- Sharlet Salina,  She had solid food impacted in the entire esophagus, almost 2 hr procedure to clear the esophagus.   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 She may not respond adequately with pneumatic dilation. Best option is surgery (Heller Myotomy) Full report will be scanned into epic.  Beth, can you please send referral to [facility] surgeon Dr.Jacob Klapper , request them to schedule an appointment soon. She is failing to thrive  Thanks [place]

## 2020-01-18 NOTE — Telephone Encounter (Signed)
Spoke with Thayer Ohm at the office of Dr Karie Georges. Discussed urgent referral. Records faxed to his attention. Thayer Ohm will pass the records on the the referral coordinator for prompt review.  Fax [phone] Thanked this very helpful assistant for the attention to this referral.

## 2020-01-18 NOTE — Telephone Encounter (Signed)
Patient's husband states that copay for carafate is over $100 and she would like to try tablet form of medication.  Rx for carafate 1 gram take 1 tablet 4 times daily with meals and at bedtime sent to pharmacy.  Kaitlyn Hodges advised to have patient crush tablet and make a slurry when taking the medication.  Kaitlyn Hodges advised that patient should NOT swallow tablets.  Kaitlyn Hodges verbalized understanding and agreed to plan.  No further questions.

## 2020-01-18 NOTE — Telephone Encounter (Signed)
Pt's husband called to inform that copay for carafe is over $100. He wants to know if there is another option that is more affordable.

## 2020-01-19 ENCOUNTER — Other Ambulatory Visit: Payer: Self-pay

## 2020-01-19 LAB — SURGICAL PATHOLOGY

## 2020-01-23 ENCOUNTER — Other Ambulatory Visit: Admission: RE | Admit: 2020-01-23 | Payer: 59 | Source: Ambulatory Visit

## 2020-04-12 DIAGNOSIS — K220 Achalasia of cardia: Secondary | ICD-10-CM | POA: Diagnosis present

## 2020-04-24 ENCOUNTER — Ambulatory Visit: Payer: 59 | Admitting: Gastroenterology

## 2020-06-07 ENCOUNTER — Other Ambulatory Visit: Payer: Self-pay | Admitting: Family Medicine

## 2020-06-07 DIAGNOSIS — J40 Bronchitis, not specified as acute or chronic: Secondary | ICD-10-CM

## 2020-07-17 NOTE — Progress Notes (Signed)
MyChart Video Visit    Virtual Visit via Video Note   This visit type was conducted due to national recommendations for restrictions regarding the COVID-19 Pandemic (e.g. social distancing) in an effort to limit this patient's exposure and mitigate transmission in our community. This patient is at least at moderate risk for complications without adequate follow up. This format is felt to be most appropriate for this patient at this time. Physical exam was limited by quality of the video and audio technology used for the visit.   Patient location: Home Provider location: Office   I discussed the limitations of evaluation and management by telemedicine and the availability of in person appointments. The patient expressed understanding and agreed to proceed.  Patient: Kaitlyn Hodges   DOB: 20-Aug-1961   59 y.o. Female  MRN: [id number] Visit Date: 07/18/2020  Today's healthcare provider: Trey Sailors, PA-C   Chief Complaint  Patient presents with  . Sinus Problem   Subjective    URI  This is a new problem. The current episode started in the past 7 days. The problem has been unchanged. The maximum temperature recorded prior to her arrival was 100.4 - 100.9 F. Associated symptoms include congestion, coughing, headaches, nausea, rhinorrhea, sinus pain, sneezing, a sore throat and wheezing. Pertinent negatives include no vomiting. She has tried increased fluids, decongestant and antihistamine for the symptoms. The treatment provided mild relief.    Patient reports sinus infections six times in the last year. She reports severe congestion. She has tried OTC medications without relief. She reports symptoms have been going on since Friday 07/15/2020. She has not been tested for COVID or flu.      Medications: Outpatient Medications Prior to Visit  Medication Sig  . sucralfate (CARAFATE) 1 g tablet Take 1 tablet (1 g total) by mouth 4 (four) times daily -  with meals and at bedtime.   No  facility-administered medications prior to visit.    Review of Systems  Constitutional: Positive for appetite change, chills, fatigue and fever (99.0-100.0).  HENT: Positive for congestion, postnasal drip, rhinorrhea, sinus pressure, sinus pain, sneezing and sore throat.   Respiratory: Positive for cough and wheezing. Negative for chest tightness and shortness of breath.   Gastrointestinal: Positive for nausea. Negative for vomiting.  Neurological: Positive for weakness and headaches.      Objective    LMP 02/10/2001    Physical Exam Constitutional:      Appearance: Normal appearance.  Pulmonary:     Effort: Pulmonary effort is normal. No respiratory distress.  Neurological:     Mental Status: She is alert.  Psychiatric:        Mood and Affect: Mood normal.        Behavior: Behavior normal.        Assessment & Plan    1. Viral URI  - symptoms and exam c/w viral URI  - no evidence of strep pharyngitis, CAP, AOM, bacterial sinusitis, or other bacterial infection - concern for possible COVID19 infection - will send for outpatient testing - discussed need to quarantine 10 days from start of symptoms and until fever-free for at least 48 hours - discussed need to quarantine household members - discussed symptomatic management, natural course, and return precautions -encourgaed to wait 10+ days from symptoms and if worsening may take abx.   - COVID-19, Flu A+B and RSV - promethazine-dextromethorphan (PROMETHAZINE-DM) 6.25-15 MG/5ML syrup; Take 5 mLs by mouth 4 (four) times daily as needed for cough.  Dispense: 118 mL; Refill: 0 - amoxicillin-clavulanate (AUGMENTIN) 875-125 MG tablet; Take 1 tablet by mouth 2 (two) times daily for 7 days.  Dispense: 14 tablet; Refill: 0  2. Sinusitis, unspecified chronicity, unspecified location  - amoxicillin-clavulanate (AUGMENTIN) 875-125 MG tablet; Take 1 tablet by mouth 2 (two) times daily for 7 days.  Dispense: 14 tablet; Refill:  0   No follow-ups on file.     I discussed the assessment and treatment plan with the patient. The patient was provided an opportunity to ask questions and all were answered. The patient agreed with the plan and demonstrated an understanding of the instructions.   The patient was advised to call back or seek an in-person evaluation if the symptoms worsen or if the condition fails to improve as anticipated.   ITrey Sailors, PA-C, have reviewed all documentation for this visit. The documentation on 07/19/20 for the exam, diagnosis, procedures, and orders are all accurate and complete.  The entirety of the information documented in the History of Present Illness, Review of Systems and Physical Exam were personally obtained by me. Portions of this information were initially documented by [facility] and reviewed by me for thoroughness and accuracy.    Maryella Shivers [facility] [phone] (phone) [phone] (fax)  [facility]

## 2020-07-18 ENCOUNTER — Telehealth (INDEPENDENT_AMBULATORY_CARE_PROVIDER_SITE_OTHER): Payer: 59 | Admitting: Physician Assistant

## 2020-07-18 ENCOUNTER — Other Ambulatory Visit: Payer: Self-pay

## 2020-07-18 DIAGNOSIS — J069 Acute upper respiratory infection, unspecified: Secondary | ICD-10-CM

## 2020-07-18 DIAGNOSIS — J329 Chronic sinusitis, unspecified: Secondary | ICD-10-CM

## 2020-07-18 MED ORDER — AMOXICILLIN-POT CLAVULANATE 875-125 MG PO TABS: 1.0000 | ORAL_TABLET | Freq: Two times a day (BID) | ORAL | 0 refills | Status: AC

## 2020-07-18 MED ORDER — PROMETHAZINE-DM 6.25-15 MG/5ML PO SYRP: 5.0000 mL | ORAL_SOLUTION | Freq: Four times a day (QID) | ORAL | 0 refills | Status: DC | PRN

## 2020-07-20 LAB — COVID-19, FLU A+B AND RSV
Influenza A, NAA: NOT DETECTED
Influenza B, NAA: NOT DETECTED
RSV, NAA: NOT DETECTED
SARS-CoV-2, NAA: NOT DETECTED

## 2020-07-22 ENCOUNTER — Telehealth: Payer: Self-pay | Admitting: *Deleted

## 2020-07-22 NOTE — Telephone Encounter (Signed)
Copied from CRM [id number]. Topic: General - Other >> Jul 22, 2020  2:08 PM Herby Abraham C wrote: Reason for CRM: pt's spouse called in to request another work note for pt. Spouse says that pt is still not feeling well. Pt would like to have note extended to cover her for work.   Please assist.

## 2020-07-22 NOTE — Telephone Encounter (Signed)
Please advise note? 

## 2020-07-24 NOTE — Telephone Encounter (Signed)
This message was supposed to have been sent to [facility] [place] to review. Patient was last seen virtually by [name] on 07/18/2020 for viral URI and Sinusitis. Please review.

## 2020-07-24 NOTE — Telephone Encounter (Signed)
What days has she been out of work.

## 2020-07-24 NOTE — Telephone Encounter (Signed)
What days does she need covered?

## 2020-07-26 NOTE — Telephone Encounter (Signed)
Patient husband Maisie Fus called with correction of return date stated that his wife want to return on 07/31/20 because she is still sick and had a fever. Any questions please call Ph# [phone]

## 2020-07-26 NOTE — Telephone Encounter (Signed)
Pt needed note to be extended until todays date 11.26.21/ Pt was seen 11.18.21/ Pt has been out of work since 11.15.21/please advise

## 2020-07-29 NOTE — Telephone Encounter (Signed)
If she is still sick with a fever then she needs re-evaluation as it has been 2+ weeks and she has had a round of antibiotics.

## 2020-07-29 NOTE — Telephone Encounter (Addendum)
Called patient and advised her and she states that she was feeling a little better. Patient scheduled appointment with Dr. Sherrie Mustache on 07/30/2020 @ 1:00 PM. Just a FYI

## 2020-07-30 ENCOUNTER — Telehealth (INDEPENDENT_AMBULATORY_CARE_PROVIDER_SITE_OTHER): Payer: Self-pay | Admitting: Family Medicine

## 2020-07-30 DIAGNOSIS — J018 Other acute sinusitis: Secondary | ICD-10-CM

## 2020-07-30 MED ORDER — AZITHROMYCIN 250 MG PO TABS: ORAL_TABLET | ORAL | 0 refills | Status: AC

## 2020-07-30 NOTE — Progress Notes (Signed)
    MyChart Video Visit    Virtual Visit via Video Note   This visit type was conducted due to national recommendations for restrictions regarding the COVID-19 Pandemic (e.g. social distancing) in an effort to limit this patient's exposure and mitigate transmission in our community. This patient is at least at moderate risk for complications without adequate follow up. This format is felt to be most appropriate for this patient at this time. Physical exam was limited by quality of the video and audio technology used for the visit.   Patient location: home Provider location: bfp  I discussed the limitations of evaluation and management by telemedicine and the availability of in person appointments. The patient expressed understanding and agreed to proceed.  Patient: Kaitlyn Hodges   DOB: 1960/11/13   59 y.o. Female  MRN: [id number] Visit Date: 07/30/2020  Today's healthcare provider: Mila Merry, MD   Chief Complaint  Patient presents with  . Cough   Subjective    Cough This is a recurrent problem. Associated symptoms include a fever. Pertinent negatives include no chest pain, chills or shortness of breath.    Patient was seen virtually by Osvaldo Angst, PA-C on 07/18/2020 for URI and sinusitis. Patient was treated with Augmentin and promethazine-dextromethorphan (PROMETHAZINE-DM) 6.25-15 MG/5ML syrup. COVID, Flu and RSV test were negative. Symptoms improved when she first started the antibiotic, then got much worse the last two days.     Medications: Outpatient Medications Prior to Visit  Medication Sig  . promethazine-dextromethorphan (PROMETHAZINE-DM) 6.25-15 MG/5ML syrup Take 5 mLs by mouth 4 (four) times daily as needed for cough.  . sucralfate (CARAFATE) 1 g tablet Take 1 tablet (1 g total) by mouth 4 (four) times daily -  with meals and at bedtime.   No facility-administered medications prior to visit.    Review of Systems  Constitutional: Positive for fever. Negative  for appetite change, chills and fatigue.  Respiratory: Positive for cough. Negative for chest tightness and shortness of breath.   Cardiovascular: Negative for chest pain and palpitations.  Gastrointestinal: Negative for abdominal pain, nausea and vomiting.  Neurological: Negative for dizziness and weakness.      Objective    LMP 02/10/2001    Physical Exam   Awake, alert, oriented x 3. In no apparent distress   Assessment & Plan     1. Other acute sinusitis, recurrence not specified  - azithromycin (ZITHROMAX) 250 MG tablet; 2 by mouth today, then 1 daily for 4 days  Dispense: 6 tablet; Refill: 0  Anticipate returning to work on  07/05/2020. Letter written for RTW.     I discussed the assessment and treatment plan with the patient. The patient was provided an opportunity to ask questions and all were answered. The patient agreed with the plan and demonstrated an understanding of the instructions.   The patient was advised to call back or seek an in-person evaluation if the symptoms worsen or if the condition fails to improve as anticipated.  I provided 10 minutes of non-face-to-face time during this encounter.  The entirety of the information documented in the History of Present Illness, Review of Systems and Physical Exam were personally obtained by me. Portions of this information were initially documented by the CMA and reviewed by me for thoroughness and accuracy.     Mila Merry, MD [facility] [phone] (phone) [phone] (fax)  [facility]

## 2020-07-31 ENCOUNTER — Telehealth: Payer: Self-pay | Admitting: Family Medicine

## 2020-08-01 ENCOUNTER — Telehealth: Payer: Self-pay

## 2020-08-01 NOTE — Telephone Encounter (Signed)
fCopied from CRM [id number]. Topic: General - Inquiry >> Aug 01, 2020  2:31 PM Kaitlyn Hodges wrote: Reason for CRM: pt states she cannot print the work note Dr Sherrie Mustache put in her mychart.  Would like you to print and husband will come pick up. Please call him when it is ready

## 2020-08-02 NOTE — Telephone Encounter (Signed)
Printed. Advised pt.

## 2020-11-04 ENCOUNTER — Encounter: Payer: Self-pay | Admitting: Emergency Medicine

## 2020-11-04 ENCOUNTER — Emergency Department: Payer: Self-pay

## 2020-11-04 ENCOUNTER — Emergency Department
Admission: EM | Admit: 2020-11-04 | Discharge: 2020-11-04 | Disposition: A | Discharge status: Home / Self Care | Payer: Self-pay | Attending: Emergency Medicine | Admitting: Emergency Medicine

## 2020-11-04 ENCOUNTER — Other Ambulatory Visit: Payer: Self-pay

## 2020-11-04 DIAGNOSIS — S82001A Unspecified fracture of right patella, initial encounter for closed fracture: Secondary | ICD-10-CM

## 2020-11-04 DIAGNOSIS — E119 Type 2 diabetes mellitus without complications: Secondary | ICD-10-CM | POA: Insufficient documentation

## 2020-11-04 DIAGNOSIS — F17210 Nicotine dependence, cigarettes, uncomplicated: Secondary | ICD-10-CM | POA: Insufficient documentation

## 2020-11-04 DIAGNOSIS — W010XXA Fall on same level from slipping, tripping and stumbling without subsequent striking against object, initial encounter: Secondary | ICD-10-CM | POA: Insufficient documentation

## 2020-11-04 DIAGNOSIS — S82034A Nondisplaced transverse fracture of right patella, initial encounter for closed fracture: Secondary | ICD-10-CM | POA: Insufficient documentation

## 2020-11-04 MED ORDER — MELOXICAM 15 MG PO TABS
15.0000 mg | ORAL_TABLET | Freq: Every day | ORAL | 2 refills | Status: DC
Start: 2020-11-04 — End: 2021-03-03

## 2020-11-04 MED ORDER — ONDANSETRON 8 MG PO TBDP
8.0000 mg | ORAL_TABLET | Freq: Once | ORAL | Status: AC
  Administered 2020-11-04: 8 mg via ORAL
  Filled 2020-11-04: qty 1

## 2020-11-04 MED ORDER — OXYCODONE-ACETAMINOPHEN 5-325 MG PO TABS: 1.0000 | ORAL_TABLET | ORAL | 0 refills | Status: DC | PRN

## 2020-11-04 MED ORDER — MORPHINE SULFATE (PF) 4 MG/ML IV SOLN
4.0000 mg | Freq: Once | INTRAVENOUS | Status: AC
  Administered 2020-11-04: 4 mg via INTRAMUSCULAR
  Filled 2020-11-04: qty 1

## 2020-11-04 NOTE — ED Triage Notes (Signed)
Pt via POV from home. Pt had a mechanical fall last night around around 6:00pm. Pt states she tripped over her 61 year old granddaughter. Pt c/o R knee pain pt woke up this morning and the pain was worse. Denies head injury. Denies blood thinners. Pt is A&Ox4 and NAD.

## 2020-11-04 NOTE — Discharge Instructions (Signed)
Follow-up with [facility] orthoped[facility].  Please call for an appointment.  Wear the knee immobilizer.  Apply ice.  Use the walker to avoid bearing weight.  Return if worsening.

## 2020-11-04 NOTE — ED Notes (Signed)
See triage note. States she fell  Landed on right knee  Swelling noted  Unable to bear full wt

## 2020-11-04 NOTE — ED Provider Notes (Signed)
[facility] Emergency Department Provider Note  ____________________________________________   Event Date/Time   First MD Initiated Contact with Patient 11/04/20 1140     (approximate)  I have reviewed the triage vital signs and the nursing notes.   HISTORY  Chief Complaint Fall    HPI Kaitlyn Hodges is a 60 y.o. female presents to the emergency department complaining of right knee pain.  States she tripped over her granddaughter last night landed directly on the right knee.  She denies hip pain or ankle pain.  Patient states is very difficult to stand and bear weight, hurts even while sitting.  Has had a large amount of swelling overnight.    Past Medical History:  Diagnosis Date   Kidney stones    Stroke 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   Tobacco abuse 02/08/2015    Past Surgical History:  Procedure Laterality Date   ABDOMINAL HYSTERECTOMY  2003   Total; Polycystic ovarian Disease   BIOPSY  01/17/2020   Procedure: BIOPSY;  Surgeon: Napoleon Form, MD;  Location: [facility] ENDOSCOPY;  Service: Endoscopy;;   ESOPHAGOGASTRODUODENOSCOPY (EGD) WITH PROPOFOL N/A 12/14/2019   Procedure: ESOPHAGOGASTRODUODENOSCOPY (EGD) WITH PROPOFOL;  Surgeon: Wyline Mood, MD;  Location: [facility] ENDOSCOPY;  Service: Gastroenterology;  Laterality: N/A;   ESOPHAGOGASTRODUODENOSCOPY (EGD) WITH PROPOFOL N/A 01/17/2020   Procedure: ESOPHAGOGASTRODUODENOSCOPY  (EGD) WITH PROPOFOL;  Surgeon: Napoleon Form, MD;  Location: [facility] ENDOSCOPY;  Service: Endoscopy;  Laterality: N/A;  Gastric Lavage   FOREIGN BODY REMOVAL  01/17/2020   Procedure: FOREIGN BODY REMOVAL;  Surgeon: Napoleon Form, MD;  Location: [facility] ENDOSCOPY;  Service: Endoscopy;;  Food impaction in Esophagus    Prior to Admission medications   Medication Sig Start Date End Date Taking? Authorizing Provider  meloxicam (MOBIC) 15 MG tablet Take 1 tablet (15 mg total) by mouth daily. 11/04/20 11/04/21 Yes Hoover Grewe, Roselyn Bering, PA-C  oxyCODONE-acetaminophen (PERCOCET) 5-325 MG tablet Take 1 tablet by mouth every 4 (four) hours as needed for severe pain. 11/04/20 11/04/21 Yes Jasmine Mcbeth, Roselyn Bering, PA-C  sucralfate (CARAFATE) 1 g tablet Take 1 tablet (1 g total) by mouth 4 (four) times daily -  with meals and at bedtime. 01/18/20   Napoleon Form, MD    Allergies Patient has no known allergies.  Family History  Problem Relation Age of Onset   Breast cancer Mother    Stroke Mother    Raynaud syndrome Mother    Hypertension Father    Bone cancer Father    Alcohol abuse Father    Skin cancer Sister    Ovarian cancer Maternal Grandmother    Alcohol abuse Maternal Grandfather    Skin cancer Paternal Grandmother    Hypertension Paternal Grandmother    Heart attack Paternal Grandfather    Stroke Paternal Grandfather     Social History Social History   Tobacco Use   Smoking status: Current Every Day Smoker    Years: 40.00    Types: Cigarettes  Smokeless tobacco: Never Used   Tobacco comment: pt smokes 1 pack of cigarettes very 2 to 3 days  Vaping Use   Vaping Use: Never used  Substance Use Topics   Alcohol use: Yes    Alcohol/week: 0.0 standard drinks    Comment: occassional    Drug use: No    Review of Systems  Constitutional: No fever/chills Eyes: No visual changes. ENT: No sore throat. Respiratory: Denies cough Cardiovascular: Denies chest  pain Gastrointestinal: Denies abdominal pain Genitourinary: Negative for dysuria. Musculoskeletal: Negative for back pain.  Positive right knee pain Skin: Negative for rash. Psychiatric: no mood changes,     ____________________________________________   PHYSICAL EXAM:  VITAL SIGNS: ED Triage Vitals [11/04/20 1121]  Enc Vitals Group     BP 109/82     Pulse Rate 90     Resp 20     Temp 98 F (36.7 C)     Temp Source Oral     SpO2 98 %     Weight 130 lb (59 kg)     Height 5\' 8"  (1.727 m)     Head Circumference      Peak Flow      Pain Score 9     Pain Loc      Pain Edu?      Excl. in GC?     Constitutional: Alert and oriented. Well appearing and in no acute distress. Eyes: Conjunctivae are normal.  Head: Atraumatic. Nose: No congestion/rhinnorhea. Mouth/Throat: Mucous membranes are moist.   Neck:  supple no lymphadenopathy noted Cardiovascular: Normal rate, regular rhythm.  Respiratory: Normal respiratory effort.  No retractions,  GU: deferred Musculoskeletal: Decreased range of motion of the right knee, patient is guarding, large amount of swelling and tenderness noted at the joint line patella, tenderness above the knee also the distal femur, neurovascular is intact, right hip and right ankle are not tender  neurologic:  Normal speech and language.  Skin:  Skin is warm, dry and intact. No rash noted. Psychiatric: Mood and affect are normal. Speech and behavior are normal.  ____________________________________________   LABS (all labs ordered are listed, but only abnormal results are displayed)  Labs Reviewed - No data to display ____________________________________________   ____________________________________________  RADIOLOGY  X-ray of the right knee  ____________________________________________   PROCEDURES  Procedure(s) performed:   .Ortho Injury Treatment  Date/Time: 11/04/2020 1:59 PM Performed by: 01/04/2021, PA-C Authorized by:  Faythe Ghee, PA-C   Consent:    Consent obtained:  Verbal   Consent given by:  Patient   Risks discussed:  Nerve damage, restricted joint movement, vascular damage and stiffnessInjury location: knee Location details: right knee Injury type: fracture Fracture type: patellar Pre-procedure neurovascular assessment: neurovascularly intact Pre-procedure distal perfusion: normal Pre-procedure neurological function: normal Pre-procedure range of motion: reduced  Anesthesia: Local anesthesia used: no  Patient sedated: NoManipulation performed: no Immobilization: brace Splint Applied by: ED Nurse Post-procedure neurovascular assessment: post-procedure neurovascularly intact Post-procedure distal perfusion: normal Post-procedure neurological function: normal Post-procedure range of motion: unchanged Comments: Knee immobilizer placed by nursing staff, pt has a walker at home       ____________________________________________   INITIAL IMPRESSION / ASSESSMENT AND PLAN / ED COURSE  Pertinent labs & imaging results that were available during my care of the patient were reviewed by me and considered in my medical decision making (see chart for details).   Patient 60 year old female presents with right knee pain.  See HPI.  Physical exam  shows patient were stable.  X-ray of the right knee  X-ray of the right knee reviewed by me confirmed by radiology to have a patella fracture.  Did explain the findings to the patient.  He has a walker at home so we will not provide one here.  She was placed in a knee immobilizer and given fracture care.  See procedure note for application.  She is to follow-up with orthopedics.  Is given a work note as she works standing all day.  She was given a prescription for Percocet.  Meloxicam.  She is to apply ice and be nonweightbearing.  She is discharged stable condition in care of her husband     Lanore Renderos was evaluated in Emergency Department on  11/04/2020 for the symptoms described in the history of present illness. She was evaluated in the context of the global COVID-19 pandemic, which necessitated consideration that the patient might be at risk for infection with the SARS-CoV-2 virus that causes COVID-19. Institutional protocols and algorithms that pertain to the evaluation of patients at risk for COVID-19 are in a state of rapid change based on information released by regulatory bodies including the CDC and federal and state organizations. These policies and algorithms were followed during the patient's care in the ED.    As part of my medical decision making, I reviewed the following data within the electronic MEDICAL RECORD NUMBER History obtained from family, Nursing notes reviewed and incorporated, Old chart reviewed, Radiograph reviewed , Notes from prior ED visits and [place] Controlled Substance Database  ____________________________________________   FINAL CLINICAL IMPRESSION(S) / ED DIAGNOSES  Final diagnoses:  Closed nondisplaced fracture of right patella, unspecified fracture morphology, initial encounter      NEW MEDICATIONS STARTED DURING THIS VISIT:  Discharge Medication List as of 11/04/2020  1:12 PM    START taking these medications   Details  meloxicam (MOBIC) 15 MG tablet Take 1 tablet (15 mg total) by mouth daily., Starting Mon 11/04/2020, Until Tue 11/04/2021, Normal    oxyCODONE-acetaminophen (PERCOCET) 5-325 MG tablet Take 1 tablet by mouth every 4 (four) hours as needed for severe pain., Starting Mon 11/04/2020, Until Tue 11/04/2021 at 2359, Normal         Note:  This document was prepared using Dragon voice recognition software and may include unintentional dictation errors.    Faythe Ghee, PA-C 11/04/20 1403    Gilles Chiquito, MD 11/04/20 [phone]

## 2021-01-21 ENCOUNTER — Telehealth (INDEPENDENT_AMBULATORY_CARE_PROVIDER_SITE_OTHER): Payer: Self-pay | Admitting: Nurse Practitioner

## 2021-01-21 ENCOUNTER — Encounter: Payer: Self-pay | Admitting: Nurse Practitioner

## 2021-01-21 ENCOUNTER — Telehealth: Payer: Self-pay

## 2021-01-21 ENCOUNTER — Other Ambulatory Visit: Payer: Self-pay

## 2021-01-21 DIAGNOSIS — J019 Acute sinusitis, unspecified: Secondary | ICD-10-CM

## 2021-01-21 MED ORDER — PREDNISONE 10 MG PO TABS: ORAL_TABLET | ORAL | 0 refills | Status: DC

## 2021-01-21 MED ORDER — AMOXICILLIN-POT CLAVULANATE 875-125 MG PO TABS: 1.0000 | ORAL_TABLET | Freq: Two times a day (BID) | ORAL | 0 refills | Status: DC

## 2021-01-21 MED ORDER — BENZONATATE 100 MG PO CAPS: 100.0000 mg | ORAL_CAPSULE | Freq: Two times a day (BID) | ORAL | 0 refills | Status: DC | PRN

## 2021-01-21 NOTE — Telephone Encounter (Signed)
Erroneous entry

## 2021-01-21 NOTE — Assessment & Plan Note (Addendum)
Symptoms have been present for the last several weeks. Since they are worsening, will treat with augmentin and prednisone. Will also send in tessalon perles for her cough. She can continue OTC cold/flu medicine. Encouraged rest and increased fluids. Will check a covid-19 test as she is not vaccinated and not done a home test. Follow-up with PCP if symptoms do not improve or worsen. Note given for missing work today.

## 2021-01-21 NOTE — Progress Notes (Signed)
Acute Office Visit  Subjective:    Patient ID: Kaitlyn Hodges, female    DOB: [birth year], 60 y.o.   MRN: [id number]  Chief Complaint  Patient presents with  . URI    Pt's husband states that the patient has had a cough, congestion, sinus pressure, fever and body aches. States she started having congestion about a week ago, symptoms worsened last night     HPI Patient is in today for nasal congestion and sinus pain x 2 weeks  UPPER RESPIRATORY TRACT INFECTION  Worst symptom: sinus pressure Fever: yes Cough: yes Shortness of breath: no Wheezing: no Chest pain: no Chest tightness: no Chest congestion: no Nasal congestion: yes Runny nose: yes Post nasal drip: yes Sneezing: yes Sore throat: no Swollen glands: no Sinus pressure: yes Headache: no Face pain: yes Toothache: no Ear pain: no Ear pressure: no  Eyes red/itching:yes Eye drainage/crusting: no  Vomiting: yes Rash: no Fatigue: yes Sick contacts: no Strep contacts: no  Context: worse Recurrent sinusitis: no Relief with OTC cold/cough medications: last week it did, not any more  Treatments attempted: cold/sinus and anti-histamine    Past Medical History:  Diagnosis Date  . Kidney stones   . Stroke [facility])     Past Surgical History:  Procedure Laterality Date  . ABDOMINAL HYSTERECTOMY  2003   Total; Polycystic ovarian Disease  . BIOPSY  01/17/2020   Procedure: BIOPSY;  Surgeon: Napoleon Form, MD;  Location: [facility] ENDOS[facility];  Service: Endoscopy;;  . ESOPHAGOGASTRODUODENOSCOPY (EGD) WITH PROPOFOL N/A 12/14/2019   Procedure: ESOPHAGOGASTRODUODENOSCOPY (EGD) WITH PROPOFOL;  Surgeon: Wyline Mood, MD;  Location: [facility] ENDOSCOPY;  Service: Gastroenterology;  Laterality: N/A;  . ESOPHAGOGASTRODUODENOSCOPY (EGD) WITH PROPOFOL N/A 01/17/2020   Procedure: ESOPHAGOGASTRODUODENOSCOPY (EGD) WITH PROPOFOL;  Surgeon: Napoleon Form, MD;  Location: [facility] ENDOSCOPY;  Service: Endoscopy;  Laterality: N/A;  Gastric Lavage  .  FOREIGN BODY REMOVAL  01/17/2020   Procedure: FOREIGN BODY REMOVAL;  Surgeon: Napoleon Form, MD;  Location: [facility] ENDOSCOPY;  Service: Endoscopy;;  Food impaction in Esophagus    Family History  Problem Relation Age of Onset  . Breast cancer Mother   . Stroke Mother   . Raynaud syndrome Mother   . Hypertension Father   . Bone cancer Father   . Alcohol abuse Father   . Skin cancer Sister   . Ovarian cancer Maternal Grandmother   . Alcohol abuse Maternal Grandfather   . Skin cancer Paternal Grandmother   . Hypertension Paternal Grandmother   . Heart attack Paternal Grandfather   . Stroke Paternal Grandfather     Social History   Socioeconomic History  . Marital status: Married    Spouse name: Not on file  . Number of children: 2  . Years of education: Not on file  . Highest education level: Not on file  Occupational History  . Occupation: employed    Comment: Works at [employer]  . Smoking status: Current Every Day Smoker    Years: 40.00    Types: Cigarettes  . Smokeless tobacco: Never Used  . Tobacco comment: pt smokes 1 pack of cigarettes very 2 to 3 days  Vaping Use  . Vaping Use: Never used  Substance and Sexual Activity  . Alcohol use: Yes    Alcohol/week: 0.0 standard drinks    Comment: occassional   . Drug use: No  . Sexual activity: Yes  Other Topics Concern  . Not on file  Social History Narrative  .  Not on file   Social Determinants of Health   Financial Resource Strain: Not on file  Food Insecurity: Not on file  Transportation Needs: Not on file  Physical Activity: Not on file  Stress: Not on file  Social Connections: Not on file  Intimate Partner Violence: Not on file    Outpatient Medications Prior to Visit  Medication Sig Dispense Refill  . meloxicam (MOBIC) 15 MG tablet Take 1 tablet (15 mg total) by mouth daily. (Patient not taking: Reported on 01/21/2021) 30 tablet 2  . oxyCODONE-acetaminophen (PERCOCET) 5-325 MG  tablet Take 1 tablet by mouth every 4 (four) hours as needed for severe pain. (Patient not taking: Reported on 01/21/2021) 20 tablet 0  . sucralfate (CARAFATE) 1 g tablet Take 1 tablet (1 g total) by mouth 4 (four) times daily -  with meals and at bedtime. (Patient not taking: Reported on 01/21/2021) 120 tablet 1   No facility-administered medications prior to visit.    No Known Allergies  Review of Systems  Constitutional: Positive for fatigue and fever.  HENT: Positive for congestion, postnasal drip, rhinorrhea, sinus pressure and sinus pain. Negative for ear pain and sore throat.   Eyes: Positive for itching.  Respiratory: Positive for cough.   Cardiovascular: Negative.   Gastrointestinal: Positive for nausea and vomiting.  Genitourinary: Negative.   Musculoskeletal: Positive for myalgias.  Skin: Negative.   Neurological: Negative.        Objective:    Physical Exam Vitals and nursing note reviewed.  Pulmonary:     Effort: Pulmonary effort is normal.     Comments: Able to talk in complete sentences Neurological:     Mental Status: She is alert and oriented to person, place, and time.     LMP 02/10/2001  Wt Readings from Last 3 Encounters:  11/04/20 130 lb (59 kg)  01/17/20 135 lb (61.2 kg)  01/15/20 137 lb 4 oz (62.3 kg)    Health Maintenance Due  Topic Date Due  . FOOT EXAM  Never done  . OPHTHALMOLOGY EXAM  Never done  . URINE MICROALBUMIN  Never done  . HIV Screening  Never done  . Hepatitis C Screening  Never done  . TETANUS/TDAP  Never done  . PAP SMEAR-Modifier  Never done  . COLONOSCOPY (Pts 45-32yrs Insurance coverage will need to be confirmed)  Never done  . MAMMOGRAM  Never done  . HEMOGLOBIN A1C  10/20/2012    There are no preventive care reminders to display for this patient.   Lab Results  Component Value Date   TSH 1.43 09/30/2011   Lab Results  Component Value Date   WBC 6.3 11/04/2018   HGB 13.4 11/04/2018   HCT 41.9 11/04/2018   MCV  96.3 11/04/2018   PLT 223 11/04/2018   Lab Results  Component Value Date   NA 140 11/04/2018   K 3.5 11/04/2018   CO2 26 11/04/2018   GLUCOSE 106 (H) 11/04/2018   BUN 13 11/04/2018   CREATININE 0.83 11/04/2018   BILITOT 0.5 11/04/2018   ALKPHOS 56 11/04/2018   AST 13 (L) 11/04/2018   ALT 10 11/04/2018   PROT 7.2 11/04/2018   ALBUMIN 3.6 11/04/2018   CALCIUM 8.9 11/04/2018   ANIONGAP 9 11/04/2018   Lab Results  Component Value Date   CHOL 209 (A) 04/19/2012   Lab Results  Component Value Date   HDL 56 04/19/2012   Lab Results  Component Value Date   LDLCALC 137 04/19/2012  Lab Results  Component Value Date   TRIG 80 04/19/2012   No results found for: [facility] Lab Results  Component Value Date   HGBA1C 5.8 04/19/2012       Assessment & Plan:   Problem List Items Addressed This Visit      Respiratory   Acute non-recurrent sinusitis - Primary    Symptoms have been present for the last several weeks. Since they are worsening, will treat with augmentin and prednisone. Will also send in tessalon perles for her cough. She can continue OTC cold/flu medicine. Encouraged rest and increased fluids. Will check a covid-19 test as she is not vaccinated and not done a home test. Follow-up with PCP if symptoms do not improve or worsen. Note given for missing work today.       Relevant Medications   amoxicillin-clavulanate (AUGMENTIN) 875-125 MG tablet   predniSONE (DELTASONE) 10 MG tablet   benzonatate (TESSALON) 100 MG capsule   Other Relevant Orders   Novel Coronavirus, NAA ([facility])       Meds ordered this encounter  Medications  . amoxicillin-clavulanate (AUGMENTIN) 875-125 MG tablet    Sig: Take 1 tablet by mouth 2 (two) times daily.    Dispense:  20 tablet    Refill:  0  . predniSONE (DELTASONE) 10 MG tablet    Sig: Take 6 tablets today, then 5 tablets tomorrow, then decrease by 1 tablet every day until gone    Dispense:  21 tablet    Refill:  0  .  benzonatate (TESSALON) 100 MG capsule    Sig: Take 1 capsule (100 mg total) by mouth 2 (two) times daily as needed for cough.    Dispense:  20 capsule    Refill:  0    . This visit was completed via telephone due to the restrictions of the COVID-19 pandemic. All issues as above were discussed and addressed but no physical exam was performed. If it was felt that the patient should be evaluated in the office, they were directed there. The patient verbally consented to this visit. Patient was unable to complete an audio/visual visit due to Technical difficulties, Lack of internet. Due to the catastrophic nature of the COVID-19 pandemic, this visit was done through audio contact only. . Location of the patient: home . Location of the provider: work . Those involved with this call:  . Provider: Alene Mires, DNP . CMA: Wilhemena Durie, CMA . Front Desk/Registration: Harriet Pho  . Time spent on call: 15 minutes on the phone discussing health concerns. 10 minutes total spent in review of patient's record and preparation of their chart.    Gerre Scull, NP

## 2021-01-21 NOTE — Patient Instructions (Signed)

## 2021-01-22 ENCOUNTER — Telehealth: Payer: Self-pay

## 2021-01-22 LAB — SARS-COV-2, NAA 2 DAY TAT

## 2021-01-22 LAB — NOVEL CORONAVIRUS, NAA: SARS-CoV-2, NAA: NOT DETECTED

## 2021-01-22 NOTE — Telephone Encounter (Signed)
Copied from CRM [id number]. Topic: General - Other >> Jan 22, 2021  1:25 PM Gaetana Michaelis A wrote: Reason for CRM: Patient would like their excused absence note from [facility] extended if possible  Patient would like the letter submitted via mychart if that's a possible option  Patient was directed to contact the practice for the extension if they continued to feel unwell  Please contact to further advise if needed

## 2021-01-22 NOTE — Telephone Encounter (Signed)
Pt verbalized understanding.

## 2021-03-03 ENCOUNTER — Encounter: Payer: Self-pay | Admitting: Emergency Medicine

## 2021-03-03 ENCOUNTER — Emergency Department
Admission: EM | Admit: 2021-03-03 | Discharge: 2021-03-03 | Disposition: A | Discharge status: Home / Self Care | Payer: Self-pay | Attending: Emergency Medicine | Admitting: Emergency Medicine

## 2021-03-03 ENCOUNTER — Emergency Department: Payer: Self-pay

## 2021-03-03 ENCOUNTER — Other Ambulatory Visit: Payer: Self-pay

## 2021-03-03 DIAGNOSIS — F17210 Nicotine dependence, cigarettes, uncomplicated: Secondary | ICD-10-CM | POA: Insufficient documentation

## 2021-03-03 DIAGNOSIS — K047 Periapical abscess without sinus: Secondary | ICD-10-CM | POA: Insufficient documentation

## 2021-03-03 DIAGNOSIS — E119 Type 2 diabetes mellitus without complications: Secondary | ICD-10-CM | POA: Insufficient documentation

## 2021-03-03 LAB — CBC WITH DIFFERENTIAL/PLATELET
Abs Immature Granulocytes: 0.02 K/uL (ref 0.00–0.07)
Basophils Absolute: 0 K/uL (ref 0.0–0.1)
Basophils Relative: 0 %
Eosinophils Absolute: 0.2 K/uL (ref 0.0–0.5)
Eosinophils Relative: 2 %
HCT: 42.1 % (ref 36.0–46.0)
Hemoglobin: 13.6 g/dL (ref 12.0–15.0)
Immature Granulocytes: 0 %
Lymphocytes Relative: 18 %
Lymphs Abs: 1.5 K/uL (ref 0.7–4.0)
MCH: 32.5 pg (ref 26.0–34.0)
MCHC: 32.3 g/dL (ref 30.0–36.0)
MCV: 100.7 fL — ABNORMAL HIGH (ref 80.0–100.0)
Monocytes Absolute: 0.6 K/uL (ref 0.1–1.0)
Monocytes Relative: 7 %
Neutro Abs: 5.8 K/uL (ref 1.7–7.7)
Neutrophils Relative %: 73 %
Platelets: 194 K/uL (ref 150–400)
RBC: 4.18 MIL/uL (ref 3.87–5.11)
RDW: 13.9 % (ref 11.5–15.5)
WBC: 8 K/uL (ref 4.0–10.5)
nRBC: 0 % (ref 0.0–0.2)

## 2021-03-03 LAB — COMPREHENSIVE METABOLIC PANEL WITH GFR
ALT: 9 U/L (ref 0–44)
AST: 15 U/L (ref 15–41)
Albumin: 3.8 g/dL (ref 3.5–5.0)
Alkaline Phosphatase: 49 U/L (ref 38–126)
Anion gap: 11 (ref 5–15)
BUN: 12 mg/dL (ref 6–20)
CO2: 23 mmol/L (ref 22–32)
Calcium: 9 mg/dL (ref 8.9–10.3)
Chloride: 108 mmol/L (ref 98–111)
Creatinine, Ser: 0.87 mg/dL (ref 0.44–1.00)
GFR, Estimated: >60 mL/min
Glucose, Bld: 82 mg/dL (ref 70–99)
Potassium: 3.6 mmol/L (ref 3.5–5.1)
Sodium: 142 mmol/L (ref 135–145)
Total Bilirubin: 0.6 mg/dL (ref 0.3–1.2)
Total Protein: 7.1 g/dL (ref 6.5–8.1)

## 2021-03-03 MED ORDER — OXYCODONE-ACETAMINOPHEN 5-325 MG PO TABS
1.0000 | ORAL_TABLET | Freq: Once | ORAL | Status: AC
Start: 2021-03-03 — End: 2021-03-03
  Administered 2021-03-03: 1 via ORAL
  Filled 2021-03-03: qty 1

## 2021-03-03 MED ORDER — AMOXICILLIN-POT CLAVULANATE 875-125 MG PO TABS: 1.0000 | ORAL_TABLET | Freq: Two times a day (BID) | ORAL | 0 refills | Status: DC

## 2021-03-03 MED ORDER — NAPROXEN 500 MG PO TABS
500.0000 mg | ORAL_TABLET | Freq: Two times a day (BID) | ORAL | 0 refills | Status: DC
Start: 2021-03-03 — End: 2023-09-30

## 2021-03-03 MED ORDER — IOHEXOL 300 MG/ML  SOLN
75.0000 mL | Freq: Once | INTRAMUSCULAR | Status: AC | PRN
  Administered 2021-03-03: 75 mL via INTRAVENOUS
  Filled 2021-03-03: qty 75

## 2021-03-03 MED ORDER — OXYCODONE-ACETAMINOPHEN 5-325 MG PO TABS: 1.0000 | ORAL_TABLET | Freq: Four times a day (QID) | ORAL | 0 refills | Status: AC | PRN

## 2021-03-03 NOTE — ED Triage Notes (Signed)
Pt here for swelling to left jaw. Appears to have abscess.  + fevers at home up to 101.  Last took antipyretic 3 hr ago.  VSS at this time.  Redness and swelling noted to jaw. No drainage. Pt admits to poor dentition.

## 2021-03-03 NOTE — ED Provider Notes (Signed)
[facility] Emergency Department Provider Note  ____________________________________________  Time seen: Approximately 11:34 AM  I have reviewed the triage vital signs and the nursing notes.   HISTORY  Chief Complaint Abscess    HPI Kaitlyn Hodges is a 60 y.o. female with a past history of kidney stones and stroke who complains of worsening pain and swelling of the left jaw for the past 2 or 3 days.  Gradual onset, occurred after a decayed tooth broke.  Pain is throbbing, severe, nonradiating, no alleviating factors.  No trouble breathing or swallowing.  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   Depression 02/08/2015   History of diabetes mellitus, type II 02/08/2015   History of kidney stones 02/08/2015   Hypercholesteremia 02/08/2015   Seizure disorder (HCC) 02/08/2015   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   Tobacco abuse 02/08/2015     Past Surgical History:  Procedure Laterality Date   ABDOMINAL HYSTERECTOMY  2003   Total; Polycystic ovarian Disease   BIOPSY  01/17/2020   Procedure: BIOPSY;  Surgeon: Napoleon Form, MD;  Location: [facility] ENDOSCOPY;  Service: Endoscopy;;   ESOPHAGOGASTRODUODENOSCOPY (EGD) WITH PROPOFOL N/A 12/14/2019   Procedure: ESOPHAGOGASTRODUODENOSCOPY (EGD) WITH PROPOFOL;  Surgeon: Wyline Mood, MD;  Location: [facility] ENDOSCOPY;  Service: Gastroenterology;  Laterality: N/A;   ESOPHAGOGASTRODUODENOSCOPY (EGD) WITH PROPOFOL N/A 01/17/2020   Procedure: ESOPHAGOGASTRODUODENOSCOPY (EGD)  WITH PROPOFOL;  Surgeon: Napoleon Form, MD;  Location: [facility] ENDOSCOPY;  Service: Endoscopy;  Laterality: N/A;  Gastric Lavage   FOREIGN BODY REMOVAL  01/17/2020   Procedure: FOREIGN BODY REMOVAL;  Surgeon: Napoleon Form, MD;  Location: [facility] ENDOSCOPY;  Service: Endoscopy;;  Food impaction in Esophagus     Prior to Admission medications   Medication Sig Start Date End Date Taking? Authorizing Provider  amoxicillin-clavulanate (AUGMENTIN) 875-125 MG tablet Take 1 tablet by mouth 2 (two) times daily. 03/03/21  Yes Sharman Cheek, MD  naproxen (NAPROSYN) 500 MG tablet Take 1 tablet (500 mg total) by mouth 2 (two) times daily with a meal. 03/03/21  Yes Sharman Cheek, MD  oxyCODONE-acetaminophen (PERCOCET) 5-325 MG tablet Take 1 tablet by mouth every 6 (six) hours as needed for severe pain. 03/03/21 03/03/22 Yes Sharman Cheek, MD  benzonatate (TESSALON) 100 MG capsule Take 1 capsule (100 mg total) by mouth 2 (two) times daily as needed for cough. 01/21/21   McElwee, Jake Church, NP  predniSONE (DELTASONE) 10 MG tablet Take 6 tablets today, then 5 tablets tomorrow, then decrease by 1 tablet every day until gone 01/21/21   McElwee, Jake Church, NP     Allergies Patient has no known allergies.   Family History  Problem Relation Age of Onset   Breast cancer Mother    Stroke Mother    Raynaud syndrome Mother    Hypertension Father    Bone cancer Father    Alcohol abuse Father    Skin cancer Sister    Ovarian cancer Maternal Grandmother    Alcohol abuse Maternal Grandfather  Skin cancer Paternal Grandmother    Hypertension Paternal Grandmother    Heart attack Paternal Grandfather    Stroke Paternal Grandfather     Social History Social History   Tobacco Use   Smoking status: Every Day    Years: 40.00    Pack years: 0.00    Types: Cigarettes   Smokeless tobacco: Never   Tobacco comments:    pt smokes 1 pack of cigarettes very 2 to 3 days  Vaping Use   Vaping Use: Never used   Substance Use Topics   Alcohol use: Yes    Alcohol/week: 0.0 standard drinks    Comment: occassional    Drug use: No    Review of Systems  Constitutional: Positive fever.  ENT:   No sore throat. No rhinorrhea.  Positive for left jaw pain and swelling as above Cardiovascular:   No chest pain or syncope. Respiratory:   No dyspnea or cough. Gastrointestinal:   Negative for abdominal pain, vomiting and diarrhea.  Musculoskeletal:   Negative for focal pain or swelling All other systems reviewed and are negative except as documented above in ROS and HPI.  ____________________________________________   PHYSICAL EXAM:  VITAL SIGNS: ED Triage Vitals  Enc Vitals Group     BP 03/03/21 1048 131/82     Pulse Rate 03/03/21 1048 80     Resp 03/03/21 1048 17     Temp 03/03/21 1048 98.6 F (37 C)     Temp Source 03/03/21 1048 Oral     SpO2 03/03/21 1048 97 %     Weight 03/03/21 1047 130 lb (59 kg)     Height 03/03/21 1047 5\' 6"  (1.676 m)     Head Circumference --      Peak Flow --      Pain Score 03/03/21 1046 6     Pain Loc --      Pain Edu? --      Excl. in GC? --     Vital signs reviewed, nursing assessments reviewed.   Constitutional:   Alert and oriented. Non-toxic appearance. Eyes:   Conjunctivae are normal. EOMI. PERRL. ENT      Head:   Normocephalic and atraumatic.  There is swelling of the left jaw and under the chin in the submental space.      Nose:   Normal      Mouth/Throat:   Poor dentition, diffusely decayed and broken teeth.  At the left lower jaw, there is swelling and tenderness at the buccal gingiva.  There is submandibular swelling and tenderness which extends toward the midline submental space.  No crepitus.  No tongue elevation or floor of mouth edema..      Neck:   No meningismus. Full ROM. Hematological/Lymphatic/Immunilogical:   There is left anterior cervical lymphadenopathy. Cardiovascular:   RRR.  No murmurs. Cap refill less than 2  seconds. Respiratory:   Normal respiratory effort without tachypnea/retractions. Breath sounds are clear and equal bilaterally. No wheezes/rales/rhonchi. Musculoskeletal:   Normal range of motion in all extremities. No joint effusions.  No lower extremity tenderness.  No edema. Neurologic:   Normal speech and language.  Motor grossly intact. No acute focal neurologic deficits are appreciated.  Skin:    Skin is warm, dry and intact. No rash noted.  No petechiae, purpura, or bullae.  ____________________________________________    LABS (pertinent positives/negatives) (all labs ordered are listed, but only abnormal results are displayed) Labs Reviewed  CBC WITH DIFFERENTIAL/PLATELET - Abnormal; Notable for the following  components:      Result Value   MCV 100.7 (*)    All other components within normal limits  COMPREHENSIVE METABOLIC PANEL   ____________________________________________   EKG    ____________________________________________    RADIOLOGY  CT Maxillofacial W Contrast  Result Date: 03/03/2021 CLINICAL DATA:  Cellulitis serious LEFT mandibular abscess, submental swelling. EXAM: CT MAXILLOFACIAL WITH CONTRAST TECHNIQUE: Multidetector CT imaging of the maxillofacial structures was performed with intravenous contrast. Multiplanar CT image reconstructions were also generated. CONTRAST:  75mL OMNIPAQUE IOHEXOL 300 MG/ML  SOLN COMPARISON:  07/09/2009 FINDINGS: Osseous: No acute fracture or subluxation. Mandible is intact and located. Mild degenerative changes in the UPPER cervical spine. Dental: Mandibular: There are extensive caries and extensive destruction of the remaining mandibular teeth. Numerous periapical abscesses are identified, including abscesses associated with bilateral molars, canine, and premolars. Maxillaries: Extensive caries destruction of maxillary teeth and numerous absent teeth. Impacted RIGHT third maxillary molar. Orbits: Negative. No traumatic or  inflammatory finding. Sinuses: Mucosal thickening of the RIGHT maxillary sinus. No acute sinusitis. Soft tissues: There is soft tissue swelling/edema along the LEFT LATERAL aspect of the mandible. No fluid collection identified within the soft tissues of the jaw. Given the proximity to numerous carious teeth, the infection is likely odontogenic. Limited intracranial: No significant or unexpected finding. IMPRESSION: 1. Soft tissue swelling without evidence for abscess adjacent to the LEFT mandible. The infection is likely odontogenic. 2. The mandible is intact and there is no dislocation. 3. Significant carious destruction of maxillary and mandibular teeth, associated with multiple periapical abscesses. 4. Impacted RIGHT third maxillary molar. Chronic RIGHT maxillary sinus changes. Electronically Signed   By: Norva PavlovElizabeth  Brown M.D.   On: 03/03/2021 12:15    ____________________________________________   PROCEDURES Procedures  ____________________________________________  DIFFERENTIAL DIAGNOSIS   Dental abscess, facial cellulitis, Ludwig's angina, lymphadenitis  CLINICAL IMPRESSION / ASSESSMENT AND PLAN / ED COURSE  Medications ordered in the ED: Medications  oxyCODONE-acetaminophen (PERCOCET/ROXICET) 5-325 MG per tablet 1 tablet (1 tablet Oral Given 03/03/21 1139)  iohexol (OMNIPAQUE) 300 MG/ML solution 75 mL (75 mLs Intravenous Contrast Given 03/03/21 1151)    Pertinent labs & imaging results that were available during my care of the patient were reviewed by me and considered in my medical decision making (see chart for details).  Shearon BaloMyra Dail was evaluated in Emergency Department on 03/03/2021 for the symptoms described in the history of present illness. She was evaluated in the context of the global COVID-19 pandemic, which necessitated consideration that the patient might be at risk for infection with the SARS-CoV-2 virus that causes COVID-19. Institutional protocols and algorithms that pertain to  the evaluation of patients at risk for COVID-19 are in a state of rapid change based on information released by regulatory bodies including the CDC and federal and state organizations. These policies and algorithms were followed during the patient's care in the ED.   Patient presents with pain and swelling of the left lower jaw and face.  Will need CT scan to evaluate extent of potential abscess to ensure there is no airway threat.  Likely requires incision and drainage in the ED.  Plan to start antibiotics.   ----------------------------------------- 1:33 PM on 03/03/2021 ----------------------------------------- CT negative for abscess or any significant swelling around the airway.  Stable for discharge on Augmentin, short course of Percocet.  She will follow-up with her dentist next week.     ____________________________________________   FINAL CLINICAL IMPRESSION(S) / ED DIAGNOSES    Final diagnoses:  Dental infection  ED Discharge Orders          Ordered    naproxen (NAPROSYN) 500 MG tablet  2 times daily with meals        03/03/21 1332    oxyCODONE-acetaminophen (PERCOCET) 5-325 MG tablet  Every 6 hours PRN        03/03/21 1332    amoxicillin-clavulanate (AUGMENTIN) 875-125 MG tablet  2 times daily        03/03/21 1332            Portions of this note were generated with dragon dictation software. Dictation errors may occur despite best attempts at proofreading.   Sharman Cheek, MD 03/03/21 [phone]

## 2021-03-20 ENCOUNTER — Telehealth: Payer: Self-pay

## 2021-03-20 NOTE — Telephone Encounter (Signed)
FYI: [facility] scheduled patient an appointment to come in the office tomorrow 03/21/2021 at 11:20am for leg pain-workers compensation. I called patient and advised her that our office does not file workers compensations claims. I advised patient that she would need to go to her employer to find out where to go for work related injuries. Patient advised me that she is not going to file workers compensation for this injury. She says she didn't want to go through all of that. She just wants to see Dr. Sherrie Mustache and have him write a note saying that she needs to be out of work right now. 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 Two days ago while at work she slipped on water that was all over the floor, which caused her to fall with her legs into a split. Since then she has had pain in her left hip down to her left knee. She tried to return to work last night, but was only able to work 1 1/2 hours before they sent her home due to the severity of pain which brought her to tears. She states that she wasn't able to keep up at work because she was in so much pain. Patient wants to keep the appointment for tomorrow to have Dr. Sherrie Mustache write a note for her to be out of work.

## 2021-03-21 ENCOUNTER — Ambulatory Visit (INDEPENDENT_AMBULATORY_CARE_PROVIDER_SITE_OTHER): Payer: Self-pay | Admitting: Family Medicine

## 2021-03-21 ENCOUNTER — Other Ambulatory Visit: Payer: Self-pay

## 2021-03-21 VITALS — BP 123/84 | HR 70 | Wt 145.0 lb

## 2021-03-21 DIAGNOSIS — M5410 Radiculopathy, site unspecified: Secondary | ICD-10-CM

## 2021-03-21 NOTE — Patient Instructions (Signed)
.   Please review the attached list of medications and notify my office if there are any errors.   . Please bring all of your medications to every appointment so we can make sure that our medication list is the same as yours.   

## 2021-03-21 NOTE — Progress Notes (Signed)
Established patient visit   Patient: Kaitlyn Hodges   DOB: 1960/09/03   60 y.o. Female  MRN: [id number] Visit Date: 03/21/2021  Today's healthcare provider: Mila Merry, MD   Chief Complaint  Patient presents with   Leg Pain   Shoulder Pain    Subjective    Leg Pain  The incident occurred 5 to 7 days ago. The incident occurred at work. The injury mechanism was a fall. The pain is present in the left leg. The quality of the pain is described as aching, burning, cramping, shooting and stabbing. The pain has been Constant since onset. Associated symptoms include an inability to bear weight and numbness. The symptoms are aggravated by weight bearing and movement. She has tried NSAIDs, acetaminophen and heat for the symptoms. The treatment provided mild relief.  Shoulder Pain  The pain is present in the left shoulder. This is a new problem. The current episode started 1 to 4 weeks ago. The problem has been gradually improving. Associated symptoms include an inability to bear weight and numbness. Pertinent negatives include no joint locking or joint swelling.    Leg Pain  The incident occurred at work. The injury mechanism was a fall. She was working late at [employer] out around midnight July 18th when she slipped on wet floor causing legs to slip and nearly fell to the ground. She had sudden onset pain in her low back radiating to her left hip and leg down to her knee. The quality of the pain is described as aching, burning, cramping, shooting and stabbing. The pain has been Constant since onset. Associated symptoms include an inability to bear weight and numbness. The symptoms are aggravated by weight bearing and movement. She has tried NSAIDs, acetaminophen, ice and heat for the symptoms which have helped minimally.  Pertinent negatives include no joint locking or joint swelling. She has improved slightly the last few days, but still has difficulty walking and standing. She tried returnin to work  on 03-19-21 but couldn't.   Medications: Outpatient Medications Prior to Visit  Medication Sig   amoxicillin-clavulanate (AUGMENTIN) 875-125 MG tablet Take 1 tablet by mouth 2 (two) times daily.   naproxen (NAPROSYN) 500 MG tablet Take 1 tablet (500 mg total) by mouth 2 (two) times daily with a meal.   benzonatate (TESSALON) 100 MG capsule Take 1 capsule (100 mg total) by mouth 2 (two) times daily as needed for cough. (Patient not taking: Reported on 03/21/2021)   oxyCODONE-acetaminophen (PERCOCET) 5-325 MG tablet Take 1 tablet by mouth every 6 (six) hours as needed for severe pain.   predniSONE (DELTASONE) 10 MG tablet Take 6 tablets today, then 5 tablets tomorrow, then decrease by 1 tablet every day until gone   No facility-administered medications prior to visit.    Review of Systems  Constitutional: Negative.   Respiratory: Negative.    Cardiovascular: Negative.   Gastrointestinal: Negative.   Musculoskeletal:  Positive for arthralgias, back pain, gait problem and myalgias. Negative for joint swelling, neck pain and neck stiffness.  Neurological:  Positive for numbness. Negative for dizziness, light-headedness and headaches.      Objective    BP 123/84 (BP Location: Right Arm, Patient Position: Sitting, Cuff Size: Large)   Pulse 70   Wt 145 lb (65.8 kg)   LMP 02/10/2001   SpO2 96%   BMI 23.40 kg/m     Physical Exam  Very slow to stand and walk. Very tender over lumbar spine and left para  lumbar muscles . +5 leg strength. No hip or leg tenderness.     Assessment & Plan    1. Radiculopathy, unspecified spinal region Secondary to slip and near fall. Slightly improved over the last few days. Consider prednisone if she doesn't continue to improve. Work excuse printed through 7-27 and RTW 7-28. She is to call to extend excuse if not significantly improved by 7-28.         The entirety of the information documented in the History of Present Illness, Review of Systems and  Physical Exam were personally obtained by me. Portions of this information were initially documented by the CMA and reviewed by me for thoroughness and accuracy.     Mila Merry, MD  [facility] [phone] (phone) [phone] (fax)  [facility]

## 2023-01-20 IMAGING — DX DG KNEE COMPLETE 4+V*R*
4 series · 4 of 4 positions shown · non-contrast
Comparison: None.

CLINICAL DATA: Pain and swelling after fall last night

EXAM:
RIGHT KNEE - COMPLETE 4+ VIEW

[knee ap]
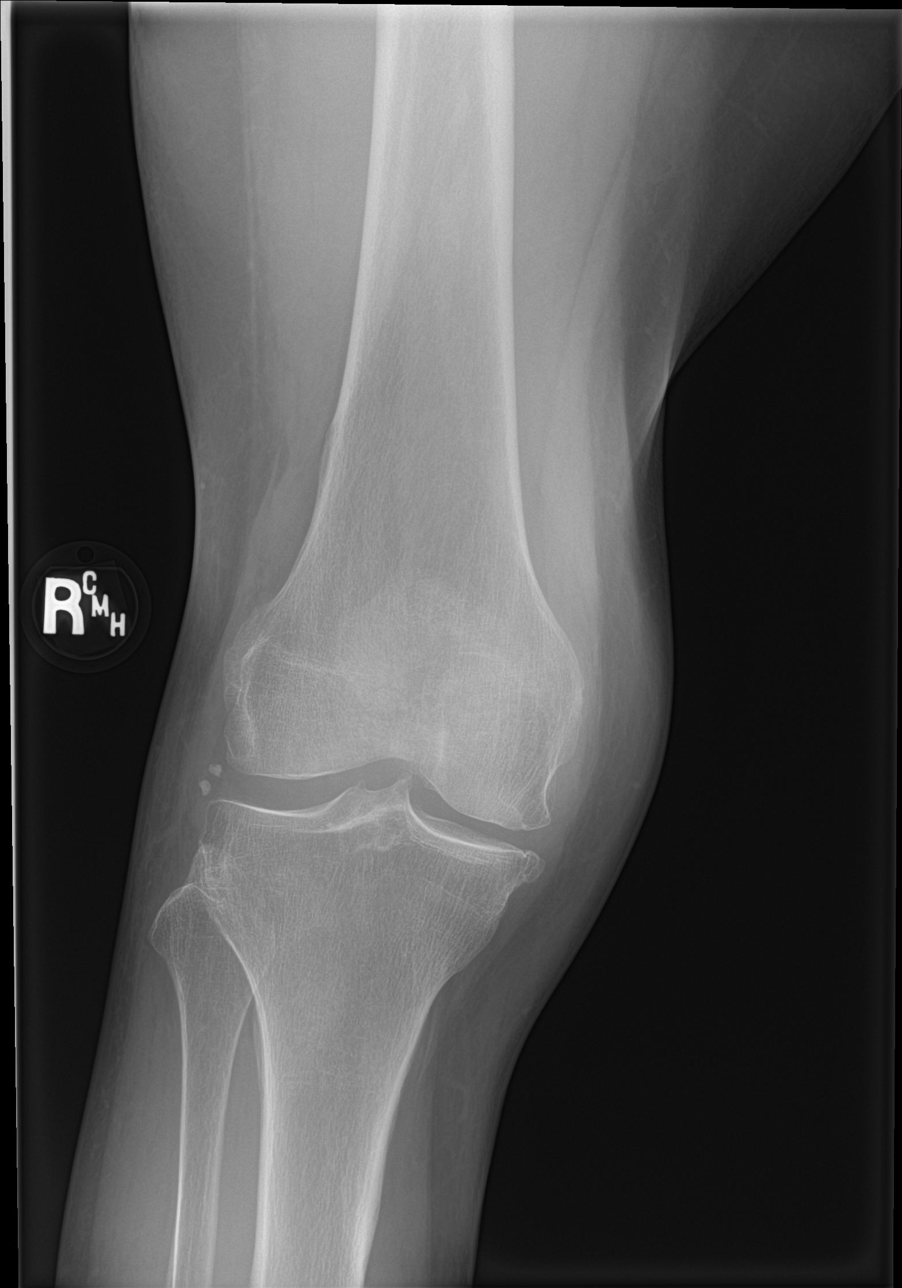

[knee obl (1 of 2)]
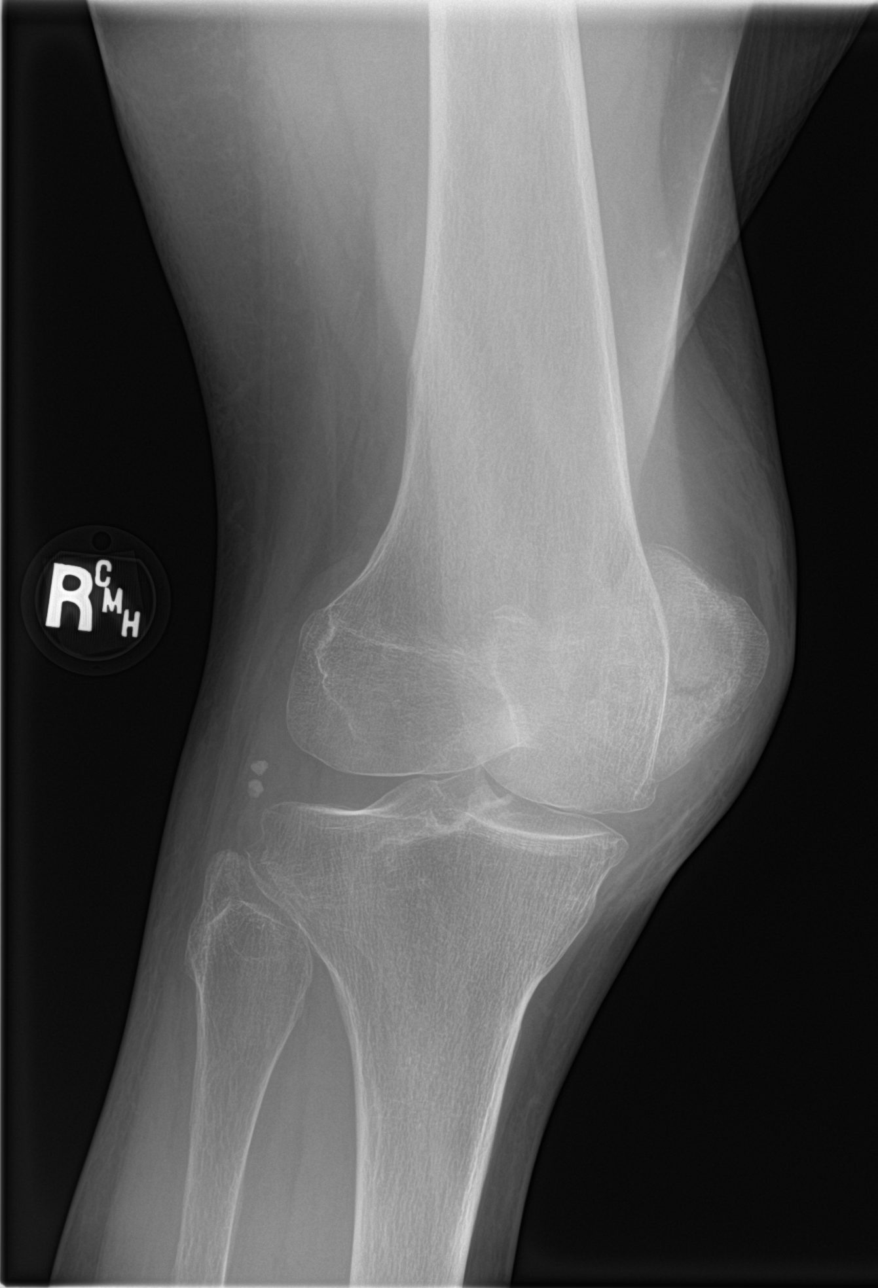

[knee obl (2 of 2)]
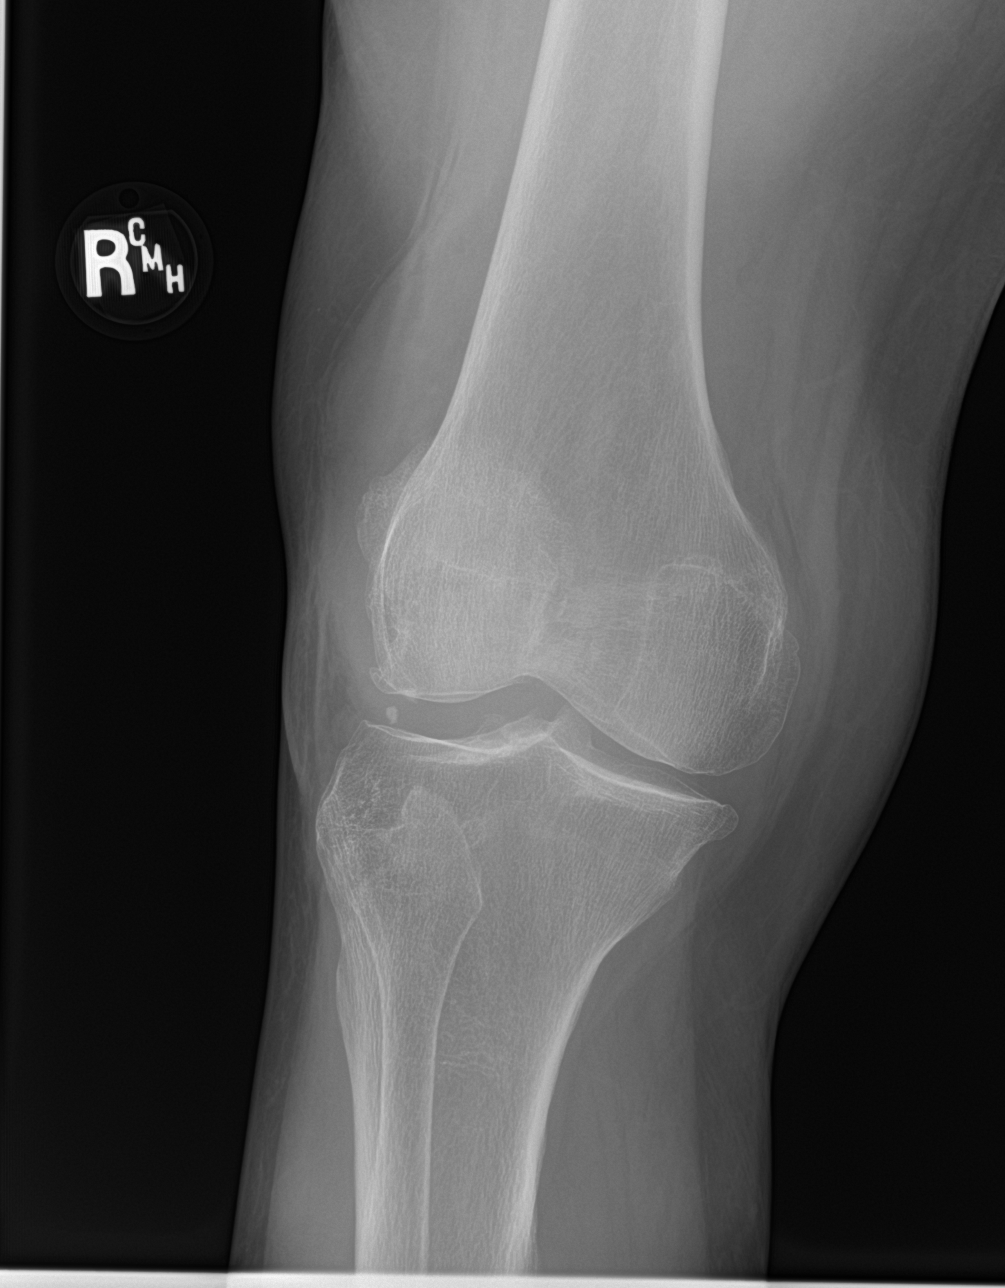

[knee lat]
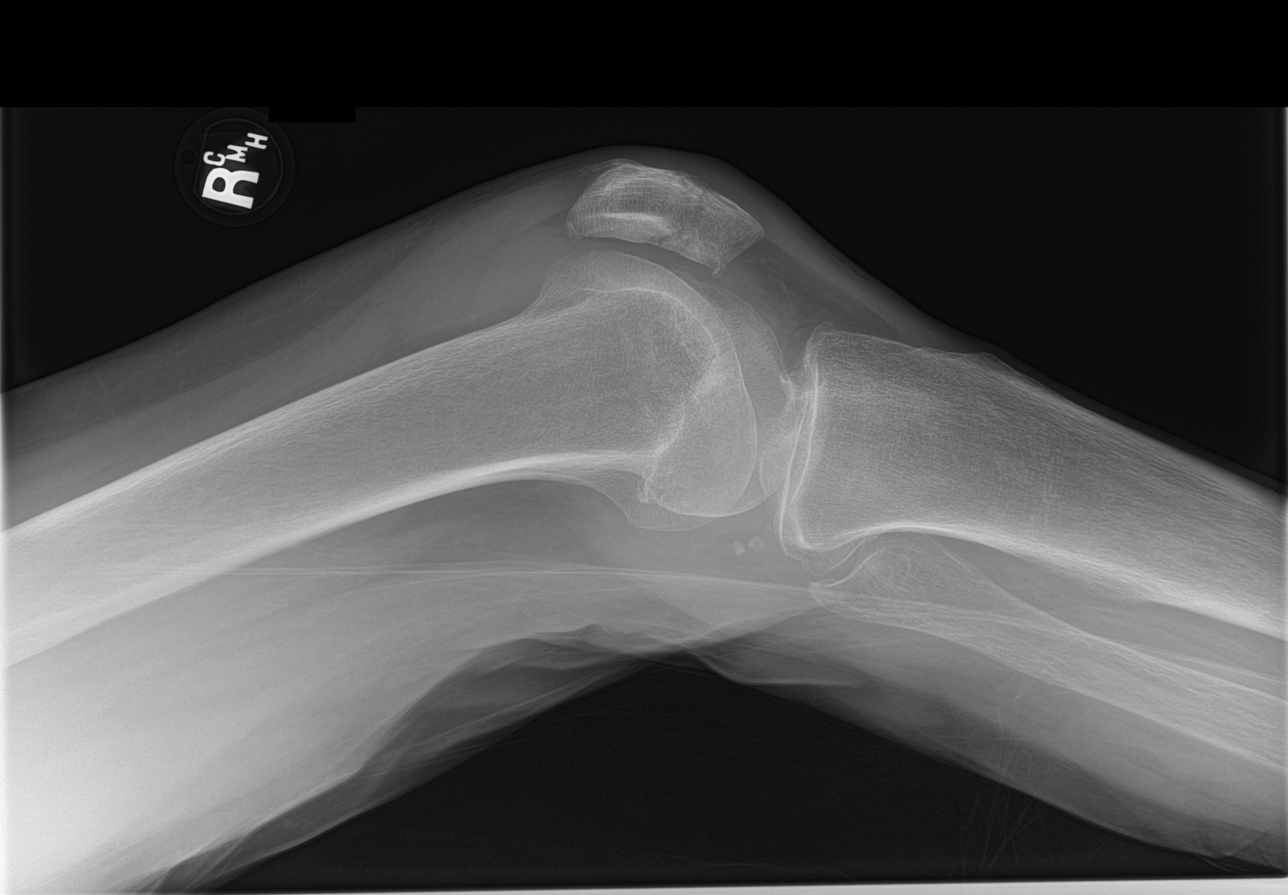

[4 of 4 positions shown; findings below may reference images not displayed]

FINDINGS: Nondisplaced fracture of the mid patella with moderate suprapatellar
joint effusion. Enthesophyte at the quadriceps insertion. Underlying
mild medial and patellofemoral compartment osteoarthritis with
heterotopic ossification along the lateral collateral ligament.
IMPRESSION: Nondisplaced patellar fracture with joint effusion.

Osteoarthritis.

## 2023-09-04 ENCOUNTER — Emergency Department: Payer: Medicaid Other

## 2023-09-04 ENCOUNTER — Other Ambulatory Visit: Payer: Self-pay

## 2023-09-04 ENCOUNTER — Inpatient Hospital Stay: Payer: Medicaid Other

## 2023-09-04 ENCOUNTER — Inpatient Hospital Stay
Admission: EM | Admit: 2023-09-04 | Discharge: 2023-09-30 | DRG: 871 | Disposition: A | Discharge status: Skilled Nursing Facility | Payer: Medicaid Other | Attending: Internal Medicine | Admitting: Internal Medicine

## 2023-09-04 DIAGNOSIS — Z515 Encounter for palliative care: Secondary | ICD-10-CM | POA: Diagnosis not present

## 2023-09-04 DIAGNOSIS — L89322 Pressure ulcer of left buttock, stage 2: Secondary | ICD-10-CM | POA: Diagnosis not present

## 2023-09-04 DIAGNOSIS — J440 Chronic obstructive pulmonary disease with acute lower respiratory infection: Secondary | ICD-10-CM | POA: Diagnosis present

## 2023-09-04 DIAGNOSIS — A419 Sepsis, unspecified organism: Principal | ICD-10-CM | POA: Diagnosis present

## 2023-09-04 DIAGNOSIS — Z823 Family history of stroke: Secondary | ICD-10-CM

## 2023-09-04 DIAGNOSIS — E860 Dehydration: Secondary | ICD-10-CM | POA: Diagnosis present

## 2023-09-04 DIAGNOSIS — F17210 Nicotine dependence, cigarettes, uncomplicated: Secondary | ICD-10-CM | POA: Diagnosis present

## 2023-09-04 DIAGNOSIS — E538 Deficiency of other specified B group vitamins: Secondary | ICD-10-CM

## 2023-09-04 DIAGNOSIS — K922 Gastrointestinal hemorrhage, unspecified: Secondary | ICD-10-CM | POA: Diagnosis not present

## 2023-09-04 DIAGNOSIS — K2289 Other specified disease of esophagus: Secondary | ICD-10-CM | POA: Diagnosis not present

## 2023-09-04 DIAGNOSIS — E282 Polycystic ovarian syndrome: Secondary | ICD-10-CM | POA: Diagnosis present

## 2023-09-04 DIAGNOSIS — N179 Acute kidney failure, unspecified: Principal | ICD-10-CM | POA: Insufficient documentation

## 2023-09-04 DIAGNOSIS — E872 Acidosis, unspecified: Secondary | ICD-10-CM | POA: Diagnosis present

## 2023-09-04 DIAGNOSIS — J9601 Acute respiratory failure with hypoxia: Secondary | ICD-10-CM | POA: Diagnosis not present

## 2023-09-04 DIAGNOSIS — W1830XA Fall on same level, unspecified, initial encounter: Secondary | ICD-10-CM | POA: Diagnosis present

## 2023-09-04 DIAGNOSIS — J9811 Atelectasis: Secondary | ICD-10-CM | POA: Diagnosis present

## 2023-09-04 DIAGNOSIS — K2981 Duodenitis with bleeding: Secondary | ICD-10-CM | POA: Diagnosis not present

## 2023-09-04 DIAGNOSIS — R5381 Other malaise: Secondary | ICD-10-CM | POA: Diagnosis present

## 2023-09-04 DIAGNOSIS — E1165 Type 2 diabetes mellitus with hyperglycemia: Secondary | ICD-10-CM | POA: Diagnosis present

## 2023-09-04 DIAGNOSIS — Z803 Family history of malignant neoplasm of breast: Secondary | ICD-10-CM

## 2023-09-04 DIAGNOSIS — J90 Pleural effusion, not elsewhere classified: Secondary | ICD-10-CM | POA: Diagnosis not present

## 2023-09-04 DIAGNOSIS — Z90710 Acquired absence of both cervix and uterus: Secondary | ICD-10-CM

## 2023-09-04 DIAGNOSIS — Q399 Congenital malformation of esophagus, unspecified: Secondary | ICD-10-CM | POA: Diagnosis not present

## 2023-09-04 DIAGNOSIS — J690 Pneumonitis due to inhalation of food and vomit: Secondary | ICD-10-CM | POA: Diagnosis present

## 2023-09-04 DIAGNOSIS — E440 Moderate protein-calorie malnutrition: Secondary | ICD-10-CM | POA: Diagnosis present

## 2023-09-04 DIAGNOSIS — Z8711 Personal history of peptic ulcer disease: Secondary | ICD-10-CM

## 2023-09-04 DIAGNOSIS — K209 Esophagitis, unspecified without bleeding: Secondary | ICD-10-CM | POA: Diagnosis present

## 2023-09-04 DIAGNOSIS — K2971 Gastritis, unspecified, with bleeding: Secondary | ICD-10-CM | POA: Diagnosis not present

## 2023-09-04 DIAGNOSIS — K220 Achalasia of cardia: Secondary | ICD-10-CM | POA: Diagnosis not present

## 2023-09-04 DIAGNOSIS — K2980 Duodenitis without bleeding: Secondary | ICD-10-CM

## 2023-09-04 DIAGNOSIS — K921 Melena: Secondary | ICD-10-CM | POA: Diagnosis not present

## 2023-09-04 DIAGNOSIS — G928 Other toxic encephalopathy: Secondary | ICD-10-CM | POA: Diagnosis present

## 2023-09-04 DIAGNOSIS — G40909 Epilepsy, unspecified, not intractable, without status epilepticus: Secondary | ICD-10-CM | POA: Diagnosis present

## 2023-09-04 DIAGNOSIS — Y92099 Unspecified place in other non-institutional residence as the place of occurrence of the external cause: Secondary | ICD-10-CM

## 2023-09-04 DIAGNOSIS — R1311 Dysphagia, oral phase: Secondary | ICD-10-CM | POA: Diagnosis not present

## 2023-09-04 DIAGNOSIS — J189 Pneumonia, unspecified organism: Secondary | ICD-10-CM | POA: Diagnosis present

## 2023-09-04 DIAGNOSIS — R569 Unspecified convulsions: Secondary | ICD-10-CM | POA: Diagnosis not present

## 2023-09-04 DIAGNOSIS — K264 Chronic or unspecified duodenal ulcer with hemorrhage: Secondary | ICD-10-CM | POA: Diagnosis not present

## 2023-09-04 DIAGNOSIS — D62 Acute posthemorrhagic anemia: Secondary | ICD-10-CM | POA: Diagnosis not present

## 2023-09-04 DIAGNOSIS — K920 Hematemesis: Secondary | ICD-10-CM | POA: Diagnosis not present

## 2023-09-04 DIAGNOSIS — Z9889 Other specified postprocedural states: Secondary | ICD-10-CM

## 2023-09-04 DIAGNOSIS — I10 Essential (primary) hypertension: Secondary | ICD-10-CM | POA: Diagnosis present

## 2023-09-04 DIAGNOSIS — Z808 Family history of malignant neoplasm of other organs or systems: Secondary | ICD-10-CM

## 2023-09-04 DIAGNOSIS — R7303 Prediabetes: Secondary | ICD-10-CM | POA: Diagnosis not present

## 2023-09-04 DIAGNOSIS — E861 Hypovolemia: Secondary | ICD-10-CM | POA: Diagnosis present

## 2023-09-04 DIAGNOSIS — R0600 Dyspnea, unspecified: Secondary | ICD-10-CM | POA: Diagnosis not present

## 2023-09-04 DIAGNOSIS — E870 Hyperosmolality and hypernatremia: Secondary | ICD-10-CM | POA: Diagnosis not present

## 2023-09-04 DIAGNOSIS — R197 Diarrhea, unspecified: Secondary | ICD-10-CM | POA: Diagnosis not present

## 2023-09-04 DIAGNOSIS — K2970 Gastritis, unspecified, without bleeding: Secondary | ICD-10-CM | POA: Diagnosis not present

## 2023-09-04 DIAGNOSIS — Z9181 History of falling: Secondary | ICD-10-CM

## 2023-09-04 DIAGNOSIS — R710 Precipitous drop in hematocrit: Secondary | ICD-10-CM | POA: Diagnosis not present

## 2023-09-04 DIAGNOSIS — Z8719 Personal history of other diseases of the digestive system: Secondary | ICD-10-CM

## 2023-09-04 DIAGNOSIS — L89152 Pressure ulcer of sacral region, stage 2: Secondary | ICD-10-CM | POA: Diagnosis present

## 2023-09-04 DIAGNOSIS — S22030A Wedge compression fracture of third thoracic vertebra, initial encounter for closed fracture: Secondary | ICD-10-CM | POA: Diagnosis present

## 2023-09-04 DIAGNOSIS — L89312 Pressure ulcer of right buttock, stage 2: Secondary | ICD-10-CM | POA: Diagnosis present

## 2023-09-04 DIAGNOSIS — R578 Other shock: Secondary | ICD-10-CM | POA: Diagnosis not present

## 2023-09-04 DIAGNOSIS — D6959 Other secondary thrombocytopenia: Secondary | ICD-10-CM | POA: Diagnosis not present

## 2023-09-04 DIAGNOSIS — Z4659 Encounter for fitting and adjustment of other gastrointestinal appliance and device: Secondary | ICD-10-CM | POA: Diagnosis not present

## 2023-09-04 DIAGNOSIS — L8990 Pressure ulcer of unspecified site, unspecified stage: Secondary | ICD-10-CM | POA: Diagnosis present

## 2023-09-04 DIAGNOSIS — R1312 Dysphagia, oropharyngeal phase: Secondary | ICD-10-CM | POA: Diagnosis not present

## 2023-09-04 DIAGNOSIS — R195 Other fecal abnormalities: Secondary | ICD-10-CM | POA: Diagnosis not present

## 2023-09-04 DIAGNOSIS — K3189 Other diseases of stomach and duodenum: Secondary | ICD-10-CM | POA: Diagnosis present

## 2023-09-04 DIAGNOSIS — Z8249 Family history of ischemic heart disease and other diseases of the circulatory system: Secondary | ICD-10-CM

## 2023-09-04 DIAGNOSIS — K254 Chronic or unspecified gastric ulcer with hemorrhage: Secondary | ICD-10-CM | POA: Diagnosis not present

## 2023-09-04 DIAGNOSIS — Z87442 Personal history of urinary calculi: Secondary | ICD-10-CM

## 2023-09-04 DIAGNOSIS — K529 Noninfective gastroenteritis and colitis, unspecified: Secondary | ICD-10-CM | POA: Diagnosis not present

## 2023-09-04 DIAGNOSIS — Z6829 Body mass index (BMI) 29.0-29.9, adult: Secondary | ICD-10-CM

## 2023-09-04 DIAGNOSIS — R6520 Severe sepsis without septic shock: Secondary | ICD-10-CM | POA: Diagnosis not present

## 2023-09-04 DIAGNOSIS — K0889 Other specified disorders of teeth and supporting structures: Secondary | ICD-10-CM | POA: Diagnosis present

## 2023-09-04 DIAGNOSIS — D696 Thrombocytopenia, unspecified: Secondary | ICD-10-CM | POA: Diagnosis not present

## 2023-09-04 DIAGNOSIS — R4182 Altered mental status, unspecified: Secondary | ICD-10-CM | POA: Diagnosis not present

## 2023-09-04 DIAGNOSIS — G9340 Encephalopathy, unspecified: Secondary | ICD-10-CM | POA: Diagnosis not present

## 2023-09-04 DIAGNOSIS — K1120 Sialoadenitis, unspecified: Secondary | ICD-10-CM | POA: Diagnosis present

## 2023-09-04 DIAGNOSIS — Z811 Family history of alcohol abuse and dependence: Secondary | ICD-10-CM

## 2023-09-04 DIAGNOSIS — Z8041 Family history of malignant neoplasm of ovary: Secondary | ICD-10-CM

## 2023-09-04 DIAGNOSIS — Z8673 Personal history of transient ischemic attack (TIA), and cerebral infarction without residual deficits: Secondary | ICD-10-CM | POA: Diagnosis not present

## 2023-09-04 DIAGNOSIS — E876 Hypokalemia: Secondary | ICD-10-CM | POA: Diagnosis not present

## 2023-09-04 LAB — PROTIME-INR
INR: 1.2 (ref 0.8–1.2)
Prothrombin Time: 15.6 s — ABNORMAL HIGH (ref 11.4–15.2)

## 2023-09-04 LAB — RESPIRATORY PANEL BY PCR

## 2023-09-04 LAB — CBC WITH DIFFERENTIAL/PLATELET
Abs Immature Granulocytes: 0.75 10*3/uL — ABNORMAL HIGH (ref 0.00–0.07)
Basophils Absolute: 0.1 10*3/uL (ref 0.0–0.1)
Basophils Relative: 0 %
Eosinophils Absolute: 0 10*3/uL (ref 0.0–0.5)
Eosinophils Relative: 0 %
HCT: 52 % — ABNORMAL HIGH (ref 36.0–46.0)
Hemoglobin: 17.8 g/dL — ABNORMAL HIGH (ref 12.0–15.0)
Immature Granulocytes: 2 %
Lymphocytes Relative: 2 %
Lymphs Abs: 0.7 10*3/uL (ref 0.7–4.0)
MCH: 32.5 pg (ref 26.0–34.0)
MCHC: 34.2 g/dL (ref 30.0–36.0)
MCV: 95.1 fL (ref 80.0–100.0)
Monocytes Absolute: 1.4 10*3/uL — ABNORMAL HIGH (ref 0.1–1.0)
Monocytes Relative: 4 %
Neutro Abs: 29.9 10*3/uL — ABNORMAL HIGH (ref 1.7–7.7)
Neutrophils Relative %: 92 %
Platelets: 193 10*3/uL (ref 150–400)
RBC: 5.47 MIL/uL — ABNORMAL HIGH (ref 3.87–5.11)
RDW: 14.3 % (ref 11.5–15.5)
Smear Review: NORMAL
WBC: 32.9 10*3/uL — ABNORMAL HIGH (ref 4.0–10.5)
nRBC: 0.2 % (ref 0.0–0.2)

## 2023-09-04 LAB — COMPREHENSIVE METABOLIC PANEL
ALT: 23 U/L (ref 0–44)
AST: 33 U/L (ref 15–41)
Albumin: 3.2 g/dL — ABNORMAL LOW (ref 3.5–5.0)
Alkaline Phosphatase: 77 U/L (ref 38–126)
Anion gap: 26 — ABNORMAL HIGH (ref 5–15)
BUN: 114 mg/dL — ABNORMAL HIGH (ref 8–23)
CO2: 18 mmol/L — ABNORMAL LOW (ref 22–32)
Calcium: 8.6 mg/dL — ABNORMAL LOW (ref 8.9–10.3)
Chloride: 100 mmol/L (ref 98–111)
Creatinine, Ser: 4.61 mg/dL — ABNORMAL HIGH (ref 0.44–1.00)
GFR, Estimated: 10 mL/min — ABNORMAL LOW (ref >60)
Glucose, Bld: 216 mg/dL — ABNORMAL HIGH (ref 70–99)
Potassium: 3.8 mmol/L (ref 3.5–5.1)
Sodium: 144 mmol/L (ref 135–145)
Total Bilirubin: 1 mg/dL (ref 0.0–1.2)
Total Protein: 7 g/dL (ref 6.5–8.1)

## 2023-09-04 LAB — URINALYSIS, W/ REFLEX TO CULTURE (INFECTION SUSPECTED)
Bilirubin Urine: NEGATIVE
Glucose, UA: 50 mg/dL — AB
Ketones, ur: NEGATIVE mg/dL
Nitrite: NEGATIVE
Protein, ur: 30 mg/dL — AB
Specific Gravity, Urine: 1.02 (ref 1.005–1.030)
pH: 5 (ref 5.0–8.0)

## 2023-09-04 LAB — RESP PANEL BY RT-PCR (RSV, FLU A&B, COVID)  RVPGX2
Influenza A by PCR: NEGATIVE
Influenza B by PCR: NEGATIVE
Resp Syncytial Virus by PCR: NEGATIVE
SARS Coronavirus 2 by RT PCR: NEGATIVE

## 2023-09-04 LAB — LACTIC ACID, PLASMA
Lactic Acid, Venous: 7.7 mmol/L (ref 0.5–1.9)
Lactic Acid, Venous: 5 mmol/L (ref 0.5–1.9)
Lactic Acid, Venous: 3.4 mmol/L (ref 0.5–1.9)

## 2023-09-04 LAB — CBG MONITORING, ED: Glucose-Capillary: 122 mg/dL — ABNORMAL HIGH (ref 70–99)

## 2023-09-04 LAB — TROPONIN I (HIGH SENSITIVITY)
Troponin I (High Sensitivity): 34 ng/L — ABNORMAL HIGH (ref <18)
Troponin I (High Sensitivity): 29 ng/L — ABNORMAL HIGH (ref <18)

## 2023-09-04 LAB — CK: Total CK: 60 U/L (ref 38–234)

## 2023-09-04 LAB — HIV ANTIBODY (ROUTINE TESTING W REFLEX): HIV Screen 4th Generation wRfx: NONREACTIVE

## 2023-09-04 LAB — APTT: aPTT: <22 s — ABNORMAL LOW (ref 24–36)

## 2023-09-04 LAB — PROCALCITONIN: Procalcitonin: 1.84 ng/mL

## 2023-09-04 MED ORDER — ONDANSETRON HCL 4 MG PO TABS: 4.0000 mg | ORAL_TABLET | Freq: Four times a day (QID) | ORAL | Status: DC | PRN

## 2023-09-04 MED ORDER — LACTATED RINGERS IV SOLN
150.0000 mL/h | INTRAVENOUS | Status: AC
  Administered 2023-09-04 (×2): 150 mL/h via INTRAVENOUS

## 2023-09-04 MED ORDER — METRONIDAZOLE 500 MG/100ML IV SOLN
500.0000 mg | Freq: Two times a day (BID) | INTRAVENOUS | Status: DC
  Administered 2023-09-04 – 2023-09-08 (×8): 500 mg via INTRAVENOUS
  Filled 2023-09-04 (×8): qty 100

## 2023-09-04 MED ORDER — METRONIDAZOLE 500 MG/100ML IV SOLN
500.0000 mg | Freq: Once | INTRAVENOUS | Status: AC
  Administered 2023-09-04: 500 mg via INTRAVENOUS
  Filled 2023-09-04: qty 100

## 2023-09-04 MED ORDER — VANCOMYCIN VARIABLE DOSE PER UNSTABLE RENAL FUNCTION (PHARMACIST DOSING): Status: DC

## 2023-09-04 MED ORDER — LACTATED RINGERS IV BOLUS
1000.0000 mL | Freq: Once | INTRAVENOUS | Status: AC
  Administered 2023-09-04: 1000 mL via INTRAVENOUS

## 2023-09-04 MED ORDER — SODIUM CHLORIDE 0.9 % IV BOLUS (SEPSIS)
1000.0000 mL | Freq: Once | INTRAVENOUS | Status: AC
  Administered 2023-09-04: 1000 mL via INTRAVENOUS

## 2023-09-04 MED ORDER — ACETAMINOPHEN 325 MG PO TABS
650.0000 mg | ORAL_TABLET | Freq: Four times a day (QID) | ORAL | Status: DC | PRN
  Administered 2023-09-14: 650 mg via ORAL
  Filled 2023-09-04: qty 2

## 2023-09-04 MED ORDER — ACETAMINOPHEN 650 MG RE SUPP
650.0000 mg | Freq: Four times a day (QID) | RECTAL | Status: DC | PRN
  Administered 2023-09-07: 650 mg via RECTAL
  Filled 2023-09-04 (×2): qty 1

## 2023-09-04 MED ORDER — VANCOMYCIN HCL 750 MG/150ML IV SOLN
750.0000 mg | Freq: Once | INTRAVENOUS | Status: AC
  Administered 2023-09-04: 750 mg via INTRAVENOUS
  Filled 2023-09-04: qty 150

## 2023-09-04 MED ORDER — SODIUM CHLORIDE 0.9 % IV SOLN: 2.0000 g | INTRAVENOUS | Status: DC

## 2023-09-04 MED ORDER — SODIUM CHLORIDE 0.9 % IV BOLUS
1000.0000 mL | Freq: Once | INTRAVENOUS | Status: AC
  Administered 2023-09-04: 1000 mL via INTRAVENOUS

## 2023-09-04 MED ORDER — CEFEPIME HCL 2 G IV SOLR
2.0000 g | Freq: Once | INTRAVENOUS | Status: AC
  Administered 2023-09-04: 2 g via INTRAVENOUS
  Filled 2023-09-04: qty 12.5

## 2023-09-04 MED ORDER — VANCOMYCIN HCL IN DEXTROSE 1-5 GM/200ML-% IV SOLN
1000.0000 mg | Freq: Once | INTRAVENOUS | Status: AC
  Administered 2023-09-04: 1000 mg via INTRAVENOUS
  Filled 2023-09-04: qty 200

## 2023-09-04 MED ORDER — ONDANSETRON HCL 4 MG/2ML IJ SOLN: 4.0000 mg | Freq: Four times a day (QID) | INTRAMUSCULAR | Status: DC | PRN

## 2023-09-04 MED ORDER — ACETAMINOPHEN 10 MG/ML IV SOLN
1000.0000 mg | Freq: Once | INTRAVENOUS | Status: AC
  Administered 2023-09-04: 1000 mg via INTRAVENOUS
  Filled 2023-09-04 (×2): qty 100

## 2023-09-04 MED ORDER — SODIUM CHLORIDE 0.9% FLUSH
3.0000 mL | Freq: Two times a day (BID) | INTRAVENOUS | Status: DC
  Administered 2023-09-04 – 2023-09-30 (×51): 3 mL via INTRAVENOUS

## 2023-09-04 MED ORDER — ENOXAPARIN SODIUM 30 MG/0.3ML IJ SOSY
30.0000 mg | PREFILLED_SYRINGE | INTRAMUSCULAR | Status: DC
  Administered 2023-09-04 – 2023-09-05 (×2): 30 mg via SUBCUTANEOUS
  Filled 2023-09-04 (×2): qty 0.3

## 2023-09-04 NOTE — ED Triage Notes (Signed)
 Pt via [facility] from home. Pt daughter found pt semi-conscious EMS reports pt is responsive to voice. Per pt she remember falling this morning and hitting her head.  Daughter states that pt gets PNA normally this time of year. Pt hx of CVA and COPD, initial O2 sat was mid-80, EMS placed on 3L [place]. Daughter states pt is normally A&Ox4 and ambulatory. Pt is alert and able to follow commands but is slow to respond.  22G to the L hand  106/73 BP  110 HR  14 CO2  313 CBG

## 2023-09-04 NOTE — Assessment & Plan Note (Signed)
 History of type I achalasia, last seen by GI several years ago and lost to follow-up.  - N.p.o.

## 2023-09-04 NOTE — Assessment & Plan Note (Signed)
 Notable hyperglycemia seen on admission, potentially stress response given history of prediabetes only with last A1c of 5.8%.  - CBG monitoring - A1c

## 2023-09-04 NOTE — ED Notes (Signed)
 RN to bedside to assess a smell. Pt had vomited a small amount. Appears to be blood tinged. Pt is still alert and answering questions. Pt cleaned up.

## 2023-09-04 NOTE — Assessment & Plan Note (Signed)
 Per chart review, patient has a history of CVA dating back to 2010.  CT of the head obtained today with chronic infarcts scattered along the left frontoparietal convexity in the left basal ganglia, as well as lacunar infarcts in the right thalamus.  No acute findings.  Does not appear patient is on statin or aspirin.

## 2023-09-04 NOTE — ED Notes (Signed)
Bare Hugger placed on patient at this time ?

## 2023-09-04 NOTE — ED Notes (Signed)
 Attempted In and Out cath- no urine return. Purewick placed.

## 2023-09-04 NOTE — ED Notes (Signed)
 This RN to bedside to introduce self to pt and family. Pt is alert and can follow commands but is very weak.

## 2023-09-04 NOTE — Assessment & Plan Note (Signed)
 Likely multifactorial in the setting of sepsis and uremia given BUN of 114.  - Aspiration precautions - N.p.o. - IV fluids as ordered

## 2023-09-04 NOTE — ED Notes (Signed)
 Pt is complaining of back pain and mouth pain. MD made aware

## 2023-09-04 NOTE — Assessment & Plan Note (Addendum)
 Patient presenting with hypotension, tachycardia, marked leukocytosis and lactic acidosis with suspicion for underlying infection, although of unknown source.  Several sources are possible including acute on chronic aspiration pneumonia (given severe achalasia and shortness of breath/cough for 1 month), versus viral source (given left parotiditis and reported nausea/vomiting for 1 week).  - Admit to Progressive unit - Telemetry monitoring - Continue cefepime , vancomycin  and Flagyl  - Respiratory viral panel - Procalcitonin - Left parotid ultrasound to rule out abscess given inability to give contrast - UA and urine culture ordered - Urine cultures pending - S/p 3 L bolus - Continue maintenance fluids - Continue to trend lactic acid to ensure improvement

## 2023-09-04 NOTE — Sepsis Progress Note (Signed)
 Sepsis protocol is being followed by [facility].

## 2023-09-04 NOTE — Assessment & Plan Note (Addendum)
 In the setting of poor p.o. intake, severe uncontrolled achalasia, and severe sepsis.  Initially in and out without any output, however will recheck after rehydration.  No evidence of hydronephrosis on imaging.  - IV fluids as ordered - Daily BMP - Avoid nephrotoxic agents - Strict in and out - Bladder scans every shift - In-N-Out as needed

## 2023-09-04 NOTE — ED Notes (Signed)
 Critical Result: Lactic 7.7  Wells, MD made aware

## 2023-09-04 NOTE — ED Notes (Signed)
 Pt was bladder scanned again due to no urine output since beginning of this RN shift at 3pm. Now shows . Pt BP trending downwards. MD messaged and made aware.

## 2023-09-04 NOTE — Consult Note (Signed)
 CODE SEPSIS - PHARMACY COMMUNICATION  **Broad Spectrum Antibiotics should be administered within 1 hour of Sepsis diagnosis**  Time Code Sepsis Called/Page Received: 1123  Antibiotics Ordered: Cefepime , Vancomycin , Metronidazole    Time of 1st antibiotic administration: 1147  Additional action taken by pharmacy: N/A  If necessary, Name of Provider/Nurse Contacted: N/A  Evonnie Nieves, PharmD Pharmacy Resident  09/04/2023 11:28 AM

## 2023-09-04 NOTE — Assessment & Plan Note (Signed)
 Incidental finding of a T3 compression fracture on imaging.  Patient's daughter reports a fall 2 weeks ago but states her mom did not complain of any pain afterwards.  Given mild nature (less than 10%) and age-indeterminate, will hold off on neurosurgery consultation.

## 2023-09-04 NOTE — H&P (Signed)
 History and Physical    Patient: Kaitlyn Hodges FMW:[id number] DOB: 1961/05/17 DOA: 09/04/2023 DOS: the patient was seen and examined on 09/04/2023 PCP: Gasper Nancyann BRAVO, MD  Patient coming from: Home  Chief Complaint:  Chief Complaint  Patient presents with   Fall   Altered Mental Status   HPI: Kaitlyn Hodges is a 63 y.o. female with medical history significant of prediabetes, CVA, seizure disorder, type I achalasia,tobacco use disorder, erosive esophagitis who presents to the ED due to altered mental status.  History obtained from patient's daughter at bedside due to patient's altered mental status.  Patient's daughter states that over the last 1 month, patient has been experiencing significant shortness of breath and cough that has progressively worsened.  Then for the last 1 week, she has been experiencing persistent nausea, vomiting and poor p.o. intake.  She notes that her mom has been handling her achalasia for years, and she has not noticed any choking episodes.  She is unsure regarding any fevers, chest pain, abdominal pain.  When asked, patient denies any pain at this time and is aware that she is at the hospital.  She is uncertain what brought her in, stating that she may have had a fall yesterday.  Patient's daughter states that fall occurred 2 weeks prior.  ED course: On arrival to the ED, patient was hypotensive at 86/68 with heart rate of 114.  She was saturating at 98% on room air.  She was afebrile at 97.6.  Initial workup notable for WBC of 32.9, hemoglobin of 17.8, bicarb 18, anion gap 26, glucose 216, BUN 114, creatinine 4.61 with GFR of 10.  Lactic acid elevated at 7.7 with decreased to 5.0.  COVID-19, influenza and RSV PCR negative.  Pan scan was obtained with findings notable for possible left parotiditis, central airway thickening and patchy airspace opacities in the bases, and age-indeterminate T3 fracture less than 10%.  Patient was started on IV fluids, broad-spectrum  antibiotics.  TR[facility] contacted for admission.  Review of Systems: unable to review all systems due to the inability of the patient to answer questions.  Past Medical History:  Diagnosis Date   Kidney stones    Stroke [facility])    Past Surgical History:  Procedure Laterality Date   ABDOMINAL HYSTERECTOMY  2003   Total; Polycystic ovarian Disease   BIOPSY  01/17/2020   Procedure: BIOPSY;  Surgeon: Shila Gustav GAILS, MD;  Location: [facility];  Service: Endoscopy;;   ESOPHAGOGASTRODUODENOSCOPY (EGD) WITH PROPOFOL  N/A 12/14/2019   Procedure: ESOPHAGOGASTRODUODENOSCOPY (EGD) WITH PROPOFOL ;  Surgeon: Therisa Bi, MD;  Location: [facility] ENDOSCOPY;  Service: Gastroenterology;  Laterality: N/A;   ESOPHAGOGASTRODUODENOSCOPY (EGD) WITH PROPOFOL  N/A 01/17/2020   Procedure: ESOPHAGOGASTRODUODENOSCOPY (EGD) WITH PROPOFOL ;  Surgeon: Shila Gustav GAILS, MD;  Location: [facility] ENDOSCOPY;  Service: Endoscopy;  Laterality: N/A;  Gastric Lavage   FOREIGN BODY REMOVAL  01/17/2020   Procedure: FOREIGN BODY REMOVAL;  Surgeon: Shila Gustav GAILS, MD;  Location: [facility];  Service: Endoscopy;;  Food impaction in Esophagus   Social History:  reports that she has been smoking cigarettes. She has never used smokeless tobacco. She reports current alcohol use. She reports that she does not use drugs.  No Known Allergies  Family History  Problem Relation Age of Onset   Breast cancer Mother    Stroke Mother    Raynaud syndrome Mother    Hypertension Father    Bone cancer Father    Alcohol abuse Father    Skin cancer Sister  Ovarian cancer Maternal Grandmother    Alcohol abuse Maternal Grandfather    Skin cancer Paternal Grandmother    Hypertension Paternal Grandmother    Heart attack Paternal Grandfather    Stroke Paternal Grandfather     Prior to Admission medications   Medication Sig Start Date End Date Taking? Authorizing Provider  amoxicillin -clavulanate (AUGMENTIN ) 875-125 MG tablet Take 1 tablet by mouth  2 (two) times daily. 03/03/21   Viviann Pastor, MD  benzonatate  (TESSALON ) 100 MG capsule Take 1 capsule (100 mg total) by mouth 2 (two) times daily as needed for cough. Patient not taking: Reported on 03/21/2021 01/21/21   Nedra Tinnie LABOR, NP  naproxen  (NAPROSYN ) 500 MG tablet Take 1 tablet (500 mg total) by mouth 2 (two) times daily with a meal. 03/03/21   Viviann Pastor, MD  predniSONE  (DELTASONE ) 10 MG tablet Take 6 tablets today, then 5 tablets tomorrow, then decrease by 1 tablet every day until gone 01/21/21   Nedra Tinnie LABOR, NP    Physical Exam: Vitals:   09/04/23 1215 09/04/23 1230 09/04/23 1300 09/04/23 1349  BP: 123/81 101/62 111/69   Pulse: 80 82 83   Resp: 12 14 11    Temp:    (!) 94.3 F (34.6 C)  TempSrc:    Rectal  SpO2: 100% 100% 99%   Weight:      Height:       Physical Exam Vitals and nursing note reviewed.  Constitutional:      General: She is not in acute distress.    Appearance: She is ill-appearing.  HENT:     Head: Normocephalic and atraumatic.     Comments: Notable left parotid swelling with overlying tenderness. No erythema or fluctuance noted.     Mouth/Throat:     Mouth: Mucous membranes are dry.  Eyes:     General: No scleral icterus.    Conjunctiva/sclera: Conjunctivae normal.     Pupils: Pupils are equal, round, and reactive to light.  Cardiovascular:     Rate and Rhythm: Normal rate and regular rhythm.     Heart sounds: No murmur heard. Pulmonary:     Effort: Pulmonary effort is normal. No respiratory distress.     Breath sounds: No wheezing, rhonchi or rales.     Comments: Diminished in the bases Abdominal:     General: There is no distension.     Palpations: Abdomen is soft.     Tenderness: There is abdominal tenderness (Suprapubic and bilateral lower quadrents).  Musculoskeletal:     Right lower leg: No edema.     Left lower leg: No edema.  Skin:    General: Skin is warm and dry.  Neurological:     Mental Status: She is alert.      Comments:  Oriented to person and place only Able to follow commands Moves all extremities against gravity    Data Reviewed: CBC with WBC of 32.9, hemoglobin of 17.8, MCV of 95.1, platelets of 193 CMP with sodium of 144, potassium 3.8, bicarb 18, glucose 216, BUN 114, creatinine 4.61, anion gap 26, AST 33, ALT 23, GFR of 10 CK 60 Troponin 34 then 29 Lactic acid 7.7 with downtrend to 5.0 COVID-19, influenza and RSV PCR negative INR 1.2  EKG personally reviewed.  Sinus rhythm with rate of 112.  No ST or T wave changes concerning for acute ischemia.  CT T-SPINE NO CHARGE Result Date: 09/04/2023 CLINICAL DATA:  Ground level fall. Hypoxia and tachycardia. Concern for traumatic injury to the  thoracic area. EXAM: CT Thoracic and Lumbar spine without contrast TECHNIQUE: Multiplanar CT images of the thoracic and lumbar spine were reconstructed from contemporary CT of the Chest, Abdomen, and Pelvis. RADIATION DOSE REDUCTION: This exam was performed according to the departmental dose-optimization program which includes automated exposure control, adjustment of the mA and/or kV according to patient size and/or use of iterative reconstruction technique. CONTRAST:  No additional COMPARISON:  Chest CT 04/07/2019.  Abdominopelvic CT 06/25/2017. FINDINGS: CT THORACIC SPINE FINDINGS Alignment: Normal. Vertebrae: Compared with the previous chest CT, there is new endplate sclerosis in the superior endplate of T3 consistent with an age indeterminate fracture, potentially acute. Less than 10% loss of vertebral body height and no osseous retropulsion. No other evidence of acute thoracic spine injury. Paraspinal and other soft tissues: No acute paraspinal findings are identified. Chest CT findings dictated separately. Disc levels: Mild multilevel spondylosis with disc space narrowing and endplate osteophytes. No evidence of large disc herniation or high-grade spinal stenosis. CT LUMBAR SPINE FINDINGS Segmentation: There  are 5 lumbar type vertebral bodies. Alignment: Normal. Vertebrae: The bones appear mildly demineralized. There is a new superior endplate Schmorl's node at L5 compared with previous abdominal CT. This demonstrates no definite acute features. No evidence of acute lumbar spine fracture or traumatic subluxation. Paraspinal and other soft tissues: No acute paraspinal findings. Abdominopelvic CT findings dictated separately. Disc levels: Mild multilevel spondylosis with mild disc space narrowing and endplate osteophyte formation inferiorly. Moderate to advanced facet disease from L3-4 through L5-S1 contributing to mild foraminal narrowing. IMPRESSION: 1. Age indeterminate fracture of the superior endplate of T3, potentially acute. Less than 10% loss of vertebral body height and no osseous retropulsion. 2. No other evidence of acute thoracic or lumbar spine injury. 3. Mild multilevel spondylosis. 4. Chest, abdomen and pelvic CT findings dictated separately. Electronically Signed   By: Elsie Perone M.D.   On: 09/04/2023 12:59   CT L-SPINE NO CHARGE Result Date: 09/04/2023 CLINICAL DATA:  Ground level fall. Hypoxia and tachycardia. Concern for traumatic injury to the thoracic area. EXAM: CT Thoracic and Lumbar spine without contrast TECHNIQUE: Multiplanar CT images of the thoracic and lumbar spine were reconstructed from contemporary CT of the Chest, Abdomen, and Pelvis. RADIATION DOSE REDUCTION: This exam was performed according to the departmental dose-optimization program which includes automated exposure control, adjustment of the mA and/or kV according to patient size and/or use of iterative reconstruction technique. CONTRAST:  No additional COMPARISON:  Chest CT 04/07/2019.  Abdominopelvic CT 06/25/2017. FINDINGS: CT THORACIC SPINE FINDINGS Alignment: Normal. Vertebrae: Compared with the previous chest CT, there is new endplate sclerosis in the superior endplate of T3 consistent with an age indeterminate  fracture, potentially acute. Less than 10% loss of vertebral body height and no osseous retropulsion. No other evidence of acute thoracic spine injury. Paraspinal and other soft tissues: No acute paraspinal findings are identified. Chest CT findings dictated separately. Disc levels: Mild multilevel spondylosis with disc space narrowing and endplate osteophytes. No evidence of large disc herniation or high-grade spinal stenosis. CT LUMBAR SPINE FINDINGS Segmentation: There are 5 lumbar type vertebral bodies. Alignment: Normal. Vertebrae: The bones appear mildly demineralized. There is a new superior endplate Schmorl's node at L5 compared with previous abdominal CT. This demonstrates no definite acute features. No evidence of acute lumbar spine fracture or traumatic subluxation. Paraspinal and other soft tissues: No acute paraspinal findings. Abdominopelvic CT findings dictated separately. Disc levels: Mild multilevel spondylosis with mild disc space narrowing and endplate osteophyte formation  inferiorly. Moderate to advanced facet disease from L3-4 through L5-S1 contributing to mild foraminal narrowing. IMPRESSION: 1. Age indeterminate fracture of the superior endplate of T3, potentially acute. Less than 10% loss of vertebral body height and no osseous retropulsion. 2. No other evidence of acute thoracic or lumbar spine injury. 3. Mild multilevel spondylosis. 4. Chest, abdomen and pelvic CT findings dictated separately. Electronically Signed   By: Elsie Perone M.D.   On: 09/04/2023 12:59   CT Head Wo Contrast Result Date: 09/04/2023 CLINICAL DATA:  Ground level fall with head injury EXAM: CT HEAD WITHOUT CONTRAST CT CERVICAL SPINE WITHOUT CONTRAST TECHNIQUE: Multidetector CT imaging of the head and cervical spine was performed following the standard protocol without intravenous contrast. Multiplanar CT image reconstructions of the cervical spine were also generated. RADIATION DOSE REDUCTION: This exam was  performed according to the departmental dose-optimization program which includes automated exposure control, adjustment of the mA and/or kV according to patient size and/or use of iterative reconstruction technique. COMPARISON:  07/05/2009 FINDINGS: CT HEAD FINDINGS Brain: No evidence of acute infarction, hemorrhage, hydrocephalus, extra-axial collection or mass lesion/mass effect. Chronic infarcts scattered along the left frontoparietal convexity and in the left basal ganglia which are in the MCA distribution. Chronic appearing lacunar infarct at the right thalamus. Vascular: No hyperdense vessel or unexpected calcification. Skull: No evidence of fracture Sinuses/Orbits: No evidence of injury. Retention cyst appearance in the inferior right maxillary sinus. CT CERVICAL SPINE FINDINGS Alignment: No traumatic malalignment Skull base and vertebrae: No acute fracture. Soft tissues and spinal canal: Asymmetric parotid size, larger on the left where there is mild fat stranding, favor left parotiditis rather than right parotid atrophy. Disc levels: Multilevel degenerative endplate and facet spurring. No high-grade bony stenosis. Upper chest: Reported separately. IMPRESSION: 1. Expanded left parotid suggesting parotiditis, correlate for regional symptoms. 2. No acute intracranial or cervical spine finding. 3. 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 Electronically Signed   By: Dorn Roulette M.D.   On: 09/04/2023 12:52   CT Cervical Spine Wo Contrast Result Date: 09/04/2023 CLINICAL DATA:  Ground level fall with head injury EXAM: CT HEAD WITHOUT CONTRAST CT CERVICAL SPINE WITHOUT CONTRAST TECHNIQUE: Multidetector CT imaging of the head and cervical spine was performed following the standard protocol without intravenous contrast. Multiplanar CT image reconstructions of the cervical spine were also generated. RADIATION DOSE REDUCTION: This exam was performed according to the departmental  dose-optimization program which includes automated exposure control, adjustment of the mA and/or kV according to patient size and/or use of iterative reconstruction technique. COMPARISON:  07/05/2009 FINDINGS: CT HEAD FINDINGS Brain: No evidence of acute infarction, hemorrhage, hydrocephalus, extra-axial collection or mass lesion/mass effect. Chronic infarcts scattered along the left frontoparietal convexity and in the left basal ganglia which are in the MCA distribution. Chronic appearing lacunar infarct at the right thalamus. Vascular: No hyperdense vessel or unexpected calcification. Skull: No evidence of fracture Sinuses/Orbits: No evidence of injury. Retention cyst appearance in the inferior right maxillary sinus. CT CERVICAL SPINE FINDINGS Alignment: No traumatic malalignment Skull base and vertebrae: No acute fracture. Soft tissues and spinal canal: Asymmetric parotid size, larger on the left where there is mild fat stranding, favor left parotiditis rather than right parotid atrophy. Disc levels: Multilevel degenerative endplate and facet spurring. No high-grade bony stenosis. Upper chest: Reported separately. IMPRESSION: 1. Expanded left parotid suggesting parotiditis, correlate for regional symptoms. 2. No acute intracranial or cervical spine finding. 3. Remote left cerebral infarcts in an  MCA distribution, progressed from 2010. Electronically Signed   By: Dorn Roulette M.D.   On: 09/04/2023 12:52   CT CHEST ABDOMEN PELVIS WO CONTRAST Result Date: 09/04/2023 CLINICAL DATA:  Ground level fall.  Hypoxia and tachycardia. EXAM: CT CHEST, ABDOMEN AND PELVIS WITHOUT CONTRAST TECHNIQUE: Multidetector CT imaging of the chest, abdomen and pelvis was performed following the standard protocol without IV contrast. RADIATION DOSE REDUCTION: This exam was performed according to the departmental dose-optimization program which includes automated exposure control, adjustment of the mA and/or kV according to patient  size and/or use of iterative reconstruction technique. COMPARISON:  Chest radiograph same date. Abdominopelvic CT 06/25/2017. Chest CT 04/07/2019. Esophagram 12/26/2019. FINDINGS: CT CHEST FINDINGS Cardiovascular: Atherosclerosis of the aorta, great vessels and coronary arteries. The heart size is normal. There is no pericardial effusion. Mediastinum/Nodes: There are no enlarged mediastinal, hilar or axillary lymph nodes. Chronic dilatation of the thoracic esophagus is again noted. The esophagus is diffusely fluid-filled. The thyroid  gland and trachea appear unremarkable. Lungs/Pleura: No pleural effusion or pneumothorax. Azygous fissure noted. There is mild biapical scarring. There is increased central airway thickening and patchy airspace opacities at both lung bases, favoring atelectasis. No consolidation. Musculoskeletal/Chest wall: No chest wall mass or aggressive osseous lesions. There is new superior endplate sclerosis at T3 consistent with a mild age-indeterminate compression deformity. Additional spine details dictated separately. CT ABDOMEN AND PELVIS FINDINGS Hepatobiliary: Heterogeneous low density throughout the liver most consistent with heterogeneous hepatic steatosis. No focally suspicious lesions identified on noncontrast imaging. No evidence of gallstones, gallbladder wall thickening or biliary dilatation. Pancreas: Unremarkable. No pancreatic ductal dilatation or surrounding inflammatory changes. Spleen: Normal in size without focal abnormality. Adrenals/Urinary Tract: The adrenal glands are unchanged with low-density thickening bilaterally consistent with chronic hyperplasia. No specific follow-up imaging recommended. There are punctate nonobstructing bilateral renal calculi. No evidence of ureteral calculus or hydronephrosis. No suspicious renal lesion. The bladder appears normal for its degree of distention. Stomach/Bowel: No enteric contrast administered. The stomach appears unremarkable for  its degree of distension. No evidence of bowel wall thickening, distention or surrounding inflammatory change. Mildly prominent stool throughout the colon. Vascular/Lymphatic: There are no enlarged abdominal or pelvic lymph nodes. Mild aortic and branch vessel atherosclerosis without evidence of aneurysm. Reproductive: Status post hysterectomy.  No adnexal mass. Other: No evidence of abdominal wall mass or hernia. No ascites or pneumoperitoneum. Musculoskeletal: Interval Schmorl's node involving the superior endplate of L5. Spine details dictated separately. No definite acute osseous findings. Chronic sacroiliac degenerative changes bilaterally. IMPRESSION: 1. No definite acute traumatic findings identified in the chest, abdomen or pelvis. 2. New superior endplate sclerosis at T3 consistent with a mild age-indeterminate compression deformity. Thoracolumbar spine details dictated separately. 3. 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 Correlate clinically. 4. Increased central airway thickening and patchy airspace opacities at both lung bases, favoring atelectasis. 5. Heterogeneous hepatic steatosis. 6. Punctate nonobstructing bilateral renal calculi. 7.  Aortic Atherosclerosis (ICD10-I70.0). Electronically Signed   By: Elsie Perone M.D.   On: 09/04/2023 12:50   DG Chest Port 1 View Result Date: 09/04/2023 CLINICAL DATA:  Questionable sepsis. EXAM: PORTABLE CHEST 1 VIEW COMPARISON:  05/26/2019 FINDINGS: Heart size is normal. No pleural fluid, interstitial edema, or airspace consolidation. The visualized osseous structures are unremarkable. IMPRESSION: 1. No acute findings. Electronically Signed   By: Waddell Calk M.D.   On: 09/04/2023 09:51   Results are pending, will review when available.  Assessment and Plan:  Severe sepsis [facility]) Patient  presenting with hypotension, tachycardia, marked leukocytosis and lactic acidosis with suspicion for underlying infection,  although of unknown source.  Several sources are possible including acute on chronic aspiration pneumonia (given severe achalasia and shortness of breath/cough for 1 month), versus viral source (given left parotiditis and reported nausea/vomiting for 1 week).  - Admit to Progressive unit - Telemetry monitoring - Continue cefepime , vancomycin  and Flagyl  - Respiratory viral panel - Procalcitonin - Left parotid ultrasound to rule out abscess given inability to give contrast - UA and urine culture ordered - Urine cultures pending - S/p 3 L bolus - Continue maintenance fluids - Continue to trend lactic acid to ensure improvement  AKI (acute kidney injury) (HCC) In the setting of poor p.o. intake, severe uncontrolled achalasia, and severe sepsis.  Initially in and out without any output, however will recheck after rehydration.  No evidence of hydronephrosis on imaging.  - IV fluids as ordered - Daily BMP - Avoid nephrotoxic agents - Strict in and out - Bladder scans every shift - In-N-Out as needed  Acute encephalopathy Likely multifactorial in the setting of sepsis and uremia given BUN of 114.  - Aspiration precautions - N.p.o. - IV fluids as ordered  Achalasia History of type I achalasia, last seen by GI several years ago and lost to follow-up.  - N.p.o.  Pre-diabetes Notable hyperglycemia seen on admission, potentially stress response given history of prediabetes only with last A1c of 5.8%.  - CBG monitoring - A1c  Closed wedge compression fracture of T3 vertebra (HCC) Incidental finding of a T3 compression fracture on imaging.  Patient's daughter reports a fall 2 weeks ago but states her mom did not complain of any pain afterwards.  Given mild nature (less than 10%) and age-indeterminate, will hold off on neurosurgery consultation.  History of CVA (cerebrovascular accident) Per chart review, patient has a history of CVA dating back to 2010.  CT of the head obtained today  with chronic infarcts scattered along the left frontoparietal convexity in the left basal ganglia, as well as lacunar infarcts in the right thalamus.  No acute findings.  Does not appear patient is on statin or aspirin.  Seizure disorder [facility]) Per chart review, there is a reported history of seizure disorder that began after CVA in 2010.  No reported seizures since that time.  - Seizure precautions given AMS  Advance Care Planning:   Code Status: Full Code confirmed by patient's daughter at bedside due to patient's altered mental status.  Consults: None  Family Communication: Patient's daughter updated at bedside  Severity of Illness: The appropriate patient status for this patient is INPATIENT. Inpatient status is judged to be reasonable and necessary in order to provide the required intensity of service to ensure the patient's safety. The patient's presenting symptoms, physical exam findings, and initial radiographic and laboratory data in the context of their chronic comorbidities is felt to place them at high risk for further clinical deterioration. Furthermore, it is not anticipated that the patient will be medically stable for discharge from the hospital within 2 midnights of admission.   * I certify that at the point of admission it is my clinical judgment that the patient will require inpatient hospital care spanning beyond 2 midnights from the point of admission due to high intensity of service, high risk for further deterioration and high frequency of surveillance required.*  Author: Clayborne Broom, MD 09/04/2023 2:47 PM  For on call review [email or web address].

## 2023-09-04 NOTE — ED Notes (Signed)
 Patient at CT

## 2023-09-04 NOTE — ED Provider Notes (Signed)
 [facility] Provider Note    Event Date/Time   First MD Initiated Contact with Patient 09/04/23 0920     (approximate)   History   Fall and Altered Mental Status   HPI Kaitlyn Hodges is a 63 y.o. female with history of DM2, COPD, prior CVAs, seizures, tobacco abuse presenting today for fall.  Patient was reportedly found on the ground by daughter at home being semiconscious.  She was responding to voice and able to answer some questions but not consistently.  With EMS she was found to be satting in the mid 80s and placed on 3 L nasal cannula.  They found her tachycardic and hypotensive.  Patient herself is stating that she did fall at some point last night but does not remember when.  She did hit her head.  Complaining of mild pain in her head and neck.  Complaining of abdominal pain throughout.  Denies any pain to her extremities.  Denies any numbness or tingling to her extremities.     Physical Exam   Triage Vital Signs: ED Triage Vitals  Encounter Vitals Group     BP --      Systolic BP Percentile --      Diastolic BP Percentile --      Pulse Rate 09/04/23 0923 (!) 111     Resp --      Temp --      Temp src --      SpO2 --      Weight 09/04/23 0919 160 lb 0.9 oz (72.6 kg)     Height 09/04/23 0919 5' 6 (1.676 m)     Head Circumference --      Peak Flow --      Pain Score --      Pain Loc --      Pain Education --      Exclude from Growth Chart --     Most recent vital signs: Vitals:   09/04/23 1430 09/04/23 1509  BP: 108/77   Pulse: 81   Resp: 12   Temp:  (!) 95.6 F (35.3 C)  SpO2: 98%    Physical Exam: I have reviewed the vital signs and nursing notes. General: Awake, but requires verbal stimulation to engage.  Noticeably cold Head:  Atraumatic, normocephalic.   ENT:  EOM intact, PERRL.  Dry mucous membranes Neck: Neck is supple with full range of motion, No meningeal signs. Cardiovascular: Tachycardia, RR, No murmurs. Peripheral  pulses palpable and equal bilaterally. Respiratory:  Symmetrical chest wall expansion.  No rhonchi, rales, or wheezes.  Good air movement throughout.  No use of accessory muscles.   Musculoskeletal:  No cyanosis or edema. Moving extremities with full ROM Abdomen:  Soft, tenderness palpation throughout, nondistended. Neuro:  GCS 15, moving all four extremities, interacting appropriately. Speech clear.  Cranial nerves II through XII grossly intact to challenge.  Globalized weakness throughout all extremities but no obvious unilateral weakness.  Patient stating that sensation is intact and equal throughout bilateral upper and lower extremities Psych:  Calm, appropriate.   Skin:  Warm, dry, no rash.    ED Results / Procedures / Treatments   Labs (all labs ordered are listed, but only abnormal results are displayed) Labs Reviewed  LACTIC ACID, PLASMA - Abnormal; Notable for the following components:      Result Value   Lactic Acid, Venous 7.7 (*)    All other components within normal limits  LACTIC ACID, PLASMA - Abnormal; Notable  for the following components:   Lactic Acid, Venous 5.0 (*)    All other components within normal limits  COMPREHENSIVE METABOLIC PANEL - Abnormal; Notable for the following components:   CO2 18 (*)    Glucose, Bld 216 (*)    BUN 114 (*)    Creatinine, Ser 4.61 (*)    Calcium 8.6 (*)    Albumin  3.2 (*)    GFR, Estimated 10 (*)    Anion gap 26 (*)    All other components within normal limits  CBC WITH DIFFERENTIAL/PLATELET - Abnormal; Notable for the following components:   WBC 32.9 (*)    RBC 5.47 (*)    Hemoglobin 17.8 (*)    HCT 52.0 (*)    Neutro Abs 29.9 (*)    Monocytes Absolute 1.4 (*)    Abs Immature Granulocytes 0.75 (*)    All other components within normal limits  PROTIME-INR - Abnormal; Notable for the following components:   Prothrombin Time 15.6 (*)    All other components within normal limits  APTT - Abnormal; Notable for the following  components:   aPTT <22 (*)    All other components within normal limits  TROPONIN I (HIGH SENSITIVITY) - Abnormal; Notable for the following components:   Troponin I (High Sensitivity) 34 (*)    All other components within normal limits  TROPONIN I (HIGH SENSITIVITY) - Abnormal; Notable for the following components:   Troponin I (High Sensitivity) 29 (*)    All other components within normal limits  RESP PANEL BY RT-PCR (RSV, FLU A&B, COVID)  RVPGX2  CULTURE, BLOOD (ROUTINE X 2)  CULTURE, BLOOD (ROUTINE X 2)  RESPIRATORY PANEL BY PCR  CK  URINALYSIS, W/ REFLEX TO CULTURE (INFECTION SUSPECTED)  HIV ANTIBODY (ROUTINE TESTING W REFLEX)  HEMOGLOBIN A1C  PROCALCITONIN  LACTIC ACID, PLASMA     EKG My EKG interpretation: Rate of 112, sinus tachycardia.  Normal axis, normal intervals.  No acute ST elevations or depressions   RADIOLOGY Independently interpreted CT imaging and agree with radiology read   PROCEDURES:  Critical Care performed: Yes, see critical care procedure note(s)  .Critical Care  Performed by: Malvina Alm DASEN, MD Authorized by: Malvina Alm DASEN, MD   Critical care provider statement:    Critical care time (minutes):  40   Critical care was necessary to treat or prevent imminent or life-threatening deterioration of the following conditions:  Renal failure and shock   Critical care was time spent personally by me on the following activities:  Development of treatment plan with patient or surrogate, discussions with consultants, evaluation of patient's response to treatment, examination of patient, ordering and review of laboratory studies, ordering and review of radiographic studies, ordering and performing treatments and interventions, pulse oximetry, re-evaluation of patient's condition and review of old charts   I assumed direction of critical care for this patient from another provider in my specialty: no     Care discussed with: admitting provider       MEDICATIONS ORDERED IN ED: Medications  lactated ringers  infusion (150 mL/hr Intravenous New Bag/Given 09/04/23 1445)  enoxaparin  (LOVENOX ) injection 30 mg (has no administration in time range)  sodium chloride  flush (NS) 0.9 % injection 3 mL (3 mLs Intravenous Given 09/04/23 1446)  metroNIDAZOLE  (FLAGYL ) IVPB 500 mg (has no administration in time range)  acetaminophen  (TYLENOL ) tablet 650 mg (has no administration in time range)    Or  acetaminophen  (TYLENOL ) suppository 650 mg (has no administration in time  range)  ondansetron  (ZOFRAN ) tablet 4 mg (has no administration in time range)    Or  ondansetron  (ZOFRAN ) injection 4 mg (has no administration in time range)  vancomycin  (VANCOREADY) IVPB 750 mg/150 mL (750 mg Intravenous New Bag/Given 09/04/23 1511)  ceFEPIme  (MAXIPIME ) 2 g in sodium chloride  0.9 % 100 mL IVPB (has no administration in time range)  vancomycin  variable dose per unstable renal function (pharmacist dosing) (has no administration in time range)  sodium chloride  0.9 % bolus 1,000 mL (0 mLs Intravenous Stopped 09/04/23 1232)  sodium chloride  0.9 % bolus 1,000 mL (0 mLs Intravenous Stopped 09/04/23 1148)  sodium chloride  0.9 % bolus 1,000 mL (0 mLs Intravenous Stopped 09/04/23 1318)  ceFEPIme  (MAXIPIME ) 2 g in sodium chloride  0.9 % 100 mL IVPB (0 g Intravenous Stopped 09/04/23 1233)  metroNIDAZOLE  (FLAGYL ) IVPB 500 mg (0 mg Intravenous Stopped 09/04/23 1318)  vancomycin  (VANCOCIN ) IVPB 1000 mg/200 mL premix (0 mg Intravenous Stopped 09/04/23 1349)     IMPRESSION / MDM / ASSESSMENT AND PLAN / ED COURSE  I reviewed the triage vital signs and the nursing notes.                              Differential diagnosis includes, but is not limited to, ICH, cervical spine injury, rib fractures, pneumonia, PE, respiratory viral infection, intra-abdominal trauma, dehydration, sepsis  Patient's presentation is most consistent with acute presentation with potential threat to life or bodily  function.  Patient is a 63 year old female presenting today for episode of unresponsiveness with altered mental status and fatigue.  On arrival is cold and lethargic.  Noted to be slightly tachycardic and profoundly hypotensive.  Initially given 1 L of fluids with ongoing hypotension.  Second liter of fluids was ordered and bedside ultrasound shows collapsible IVC with hyperdynamic EF likely indicating severe volume depletion.  Additional history obtained by daughter stating that patient had vomiting symptoms for the past week with little p.o. intake.  Lactic acid elevated at 7.7 with white blood cell count of 32.9.  Patient started on broad-spectrum antibiotics although this could potentially just be dehydration related.  CT imaging of chest/abdomen/pelvis and head/neck showed no acute traumatic pathologies and no obvious source of sepsis.  Patient has no urine on attempted cath likely secondary to dehydration.  CMP most notable for profound acute renal failure.  Patient admitted to hospitalist for further care.  The patient is on the cardiac monitor to evaluate for evidence of arrhythmia and/or significant heart rate changes. Clinical Course as of 09/04/23 1531  Sat Sep 04, 2023  1004 Bedside ultrasound shows profoundly collapsible IVC with hyperdynamic EF likely indicating severe volume depletion is most prominent source of her hypotension at this time [DW]  1122 WBC(!): 32.9 Will start broad-spectrum IV antibiotics at this time [DW]  1123 Lactic Acid, Venous(!!): 7.7 [DW]  1141 BUN(!): 114 [DW]  1141 Creatinine(!): 4.61 [DW]    Clinical Course User Index [DW] Malvina Alm DASEN, MD     FINAL CLINICAL IMPRESSION(S) / ED DIAGNOSES   Final diagnoses:  Acute renal failure, unspecified acute renal failure type (HCC)  Lactic acidosis  Severe sepsis (HCC)     Rx / DC Orders   ED Discharge Orders     None        Note:  This document was prepared using Dragon voice recognition software  and may include unintentional dictation errors.   Malvina Alm DASEN, MD 09/04/23 [phone]

## 2023-09-04 NOTE — Assessment & Plan Note (Signed)
 Per chart review, there is a reported history of seizure disorder that began after CVA in 2010.  No reported seizures since that time.  - Seizure precautions given AMS

## 2023-09-04 NOTE — ED Notes (Signed)
 Lab called at this time due to this RN and Ahsley, RN unable to obtain blood work.

## 2023-09-04 NOTE — Sepsis Progress Note (Signed)
 Notified provider of need to consider ordering a repeat lactic acid.

## 2023-09-04 NOTE — ED Notes (Signed)
 Per MD she advised give another liter bolus, remove the bair hugger and place foley.

## 2023-09-04 NOTE — Consult Note (Signed)
 Pharmacy Antibiotic Note  Kaitlyn Hodges is a 63 y.o. female admitted on 09/04/2023 with sepsis. Pharmacy has been consulted for cefepime  and vancomycin  dosing.  Today, 09/04/2023 Day 1 of vancomycin  and cefepime   WBC 32.9  Afebrile  Lactic acid 7.7, repeat 5.0  Scr 4.61 with estimated CrCl of 12.7 mL/min  Unclear baseline; Scr 02/2021 was 0.87 Received one time doses of cefepime  2 gm and vancomycin  1 gm in the ED  Plan: Will give an additional vancomycin  750 mg to complete a total loading dose of 1750 mg  Will follow-up renal function tomorrow morning to determine maintenance dosing vs dosing by level Start cefepime  2 gm IV Q24H based on current renal function  Patient also on metronidazole  500 mg IV Q12H  Pharmacy will continue to monitor and dose adjust appropriately   Height: 5' 6 (167.6 cm) Weight: 72.6 kg (160 lb 0.9 oz) IBW/kg (Calculated) : 59.3  Temp (24hrs), Avg:96 F (35.6 C), Min:94.3 F (34.6 C), Max:97.6 F (36.4 C)  Recent Labs  Lab 09/04/23 1041 09/04/23 1241  WBC 32.9*  --   CREATININE 4.61*  --   LATICACIDVEN 7.7* 5.0*    Estimated Creatinine Clearance: 12.7 mL/min (A) (by C-G formula based on SCr of 4.61 mg/dL (H)).    No Known Allergies  Antimicrobials this admission: 01/04 metronidazole  >> 01/04 vancomycin  >>  01/04 cefepime  >>   Dose adjustments this admission:  Microbiology results: 01/04 BCx: sent   Thank you for allowing pharmacy to be a part of this patient's care.  Evonnie Nieves, PharmD Pharmacy Resident  09/04/2023 2:29 PM

## 2023-09-04 NOTE — ED Notes (Signed)
 Lab at bedside

## 2023-09-05 DIAGNOSIS — A419 Sepsis, unspecified organism: Secondary | ICD-10-CM | POA: Diagnosis not present

## 2023-09-05 DIAGNOSIS — R6520 Severe sepsis without septic shock: Secondary | ICD-10-CM | POA: Diagnosis not present

## 2023-09-05 LAB — COMPREHENSIVE METABOLIC PANEL
ALT: 18 U/L (ref 0–44)
AST: 24 U/L (ref 15–41)
Albumin: 2.5 g/dL — ABNORMAL LOW (ref 3.5–5.0)
Alkaline Phosphatase: 49 U/L (ref 38–126)
Anion gap: 17 — ABNORMAL HIGH (ref 5–15)
BUN: 97 mg/dL — ABNORMAL HIGH (ref 8–23)
CO2: 19 mmol/L — ABNORMAL LOW (ref 22–32)
Calcium: 8 mg/dL — ABNORMAL LOW (ref 8.9–10.3)
Chloride: 107 mmol/L (ref 98–111)
Creatinine, Ser: 3.01 mg/dL — ABNORMAL HIGH (ref 0.44–1.00)
GFR, Estimated: 17 mL/min — ABNORMAL LOW (ref >60)
Glucose, Bld: 99 mg/dL (ref 70–99)
Potassium: 3.3 mmol/L — ABNORMAL LOW (ref 3.5–5.1)
Sodium: 143 mmol/L (ref 135–145)
Total Bilirubin: 1.1 mg/dL (ref 0.0–1.2)
Total Protein: 5.4 g/dL — ABNORMAL LOW (ref 6.5–8.1)

## 2023-09-05 LAB — CBC WITH DIFFERENTIAL/PLATELET
Abs Immature Granulocytes: 0.31 10*3/uL — ABNORMAL HIGH (ref 0.00–0.07)
Basophils Absolute: 0.1 10*3/uL (ref 0.0–0.1)
Basophils Relative: 0 %
Eosinophils Absolute: 0 10*3/uL (ref 0.0–0.5)
Eosinophils Relative: 0 %
HCT: 42.3 % (ref 36.0–46.0)
Hemoglobin: 14.4 g/dL (ref 12.0–15.0)
Immature Granulocytes: 1 %
Lymphocytes Relative: 4 %
Lymphs Abs: 0.9 10*3/uL (ref 0.7–4.0)
MCH: 32.2 pg (ref 26.0–34.0)
MCHC: 34 g/dL (ref 30.0–36.0)
MCV: 94.6 fL (ref 80.0–100.0)
Monocytes Absolute: 0.9 10*3/uL (ref 0.1–1.0)
Monocytes Relative: 4 %
Neutro Abs: 21.7 10*3/uL — ABNORMAL HIGH (ref 1.7–7.7)
Neutrophils Relative %: 91 %
Platelets: 133 10*3/uL — ABNORMAL LOW (ref 150–400)
RBC: 4.47 MIL/uL (ref 3.87–5.11)
RDW: 14.3 % (ref 11.5–15.5)
WBC: 23.9 10*3/uL — ABNORMAL HIGH (ref 4.0–10.5)
nRBC: 0 % (ref 0.0–0.2)

## 2023-09-05 LAB — CBG MONITORING, ED: Glucose-Capillary: 101 mg/dL — ABNORMAL HIGH (ref 70–99)

## 2023-09-05 MED ORDER — LACTATED RINGERS IV SOLN
150.0000 mL/h | INTRAVENOUS | Status: AC
  Administered 2023-09-05 (×2): 150 mL/h via INTRAVENOUS

## 2023-09-05 MED ORDER — SODIUM CHLORIDE 0.9 % IV SOLN
2.0000 g | INTRAVENOUS | Status: DC
  Administered 2023-09-05 – 2023-09-06 (×2): 2 g via INTRAVENOUS
  Filled 2023-09-05 (×3): qty 20

## 2023-09-05 MED ORDER — HYDROMORPHONE HCL 1 MG/ML IJ SOLN
0.5000 mg | INTRAMUSCULAR | Status: DC | PRN
  Administered 2023-09-05 (×2): 0.5 mg via INTRAVENOUS
  Filled 2023-09-05 (×2): qty 0.5

## 2023-09-05 MED ORDER — POTASSIUM CHLORIDE 10 MEQ/100ML IV SOLN
10.0000 meq | INTRAVENOUS | Status: AC
Start: 2023-09-05 — End: 2023-09-05
  Administered 2023-09-05 (×2): 10 meq via INTRAVENOUS
  Filled 2023-09-05 (×2): qty 100

## 2023-09-05 NOTE — ED Notes (Signed)
 Pt changed at this time. Break down noted on sacrum. Foam pad applied to area. Pt brief, linens, and chux replaced. Pt repositioned in bed to take pressure off back.

## 2023-09-05 NOTE — Progress Notes (Addendum)
 Progress Note   Patient: Kaitlyn Hodges FMW:[id number] DOB: 1961-03-26 DOA: 09/04/2023     1 DOS: the patient was seen and examined on 09/05/2023   Brief hospital course: Teniyah Seivert is a 63 y.o. female with medical history significant of prediabetes, CVA, seizure disorder, type I achalasia,tobacco use disorder, erosive esophagitis who presents to the ED due to altered mental status.    Patient's daughter noted patient had dyspnea and cough for a month prior to admission and for the last week she had N/V and poor oral intake.   Patient was afebrile, hypotensive and tachycardic on vitals. She had a mild acidosis with increased AG. Noted lactate of 7.7. WBC of 32.9. BUN/cr 114/4.61. COVID, flu, RSV negative. Pan scan notable for possible L parotiditis, central airway thickening and patchy airspace opacities in the bases, age indeterminate T3 fracture.   Patient was started on broad spectrum antibiotics and fluid resuscitated.     Assessment and Plan: Severe sepsis, resolving  Patient presenting with hypotension, tachycardia, marked leukocytosis and lactic acidosis with suspicion for underlying infection, although of unknown source.    Her tachycardia and hypotension have resolved and she remains afebrile. She continues to have significantly elevated WBC count.   The possible source of her infection include  acute on chronic aspiration pneumonia (given severe achalasia and shortness of breath/cough for 1 month), versus viral source (given left parotiditis and reported nausea/vomiting for 1 week).  Resp viral panel negative (flu/RSV/COVID), L parotid US  w/o abscess, procal elevated.BC NG<24h  Plan:  - Continue broad spectrum antibiotics with vancomycin  and Flagyl . Transition cefepime  to ceftriaxone .  - UA with WBCs, many bacteria - Urine culture pending - Continue maintenance fluids - Continue to trend lactic acid to ensure improvement  AKI (acute kidney injury) (HCC) 2/2 hypovolemia and  severe sepsis. In the setting of poor p.o. intake, severe uncontrolled achalasia - IV fluids  - Daily BMP - Avoid nephrotoxic agents - Strict in and out - Bladder scans every shift - In-N-Out as needed  Acute encephalopathy Improving  Likely multifactorial in the setting of sepsis and uremia given BUN of 114. - Aspiration precautions - N.p.o. - IV fluids as ordered  Achalasia History of type I achalasia, last seen by GI several years ago and lost to follow-up. - N.p.o.  Pre-diabetes Notable hyperglycemia seen on admission, potentially stress response given history of prediabetes only with last A1c of 5.8%.  - CBG monitoring - A1c  Closed wedge compression fracture of T3 vertebra (HCC) Incidental finding of a T3 compression fracture on imaging.  Patient's daughter reports a fall 2 weeks ago but states her mom did not complain of any pain afterwards.  Given mild nature (less than 10%) and age-indeterminate, will hold off on neurosurgery consultation. -Pain control   History of CVA (cerebrovascular accident) Per chart review, patient has a history of CVA dating back to 2010.  CT of the head obtained today with chronic infarcts scattered along the left frontoparietal convexity in the left basal ganglia, as well as lacunar infarcts in the right thalamus.  No acute findings.  Does not appear patient is on statin or aspirin.  Seizure disorder [facility]) Per chart review, there is a reported history of seizure disorder that began after CVA in 2010.  No reported seizures since that time. - Seizure precautions given AMS     Subjective: Patient is drowsy but denies any specific pain.   Physical Exam: Vitals:   09/05/23 0400 09/05/23 0500 09/05/23 0700 09/05/23 0800  BP: (!) 124/104 122/71 115/73 115/75  Pulse:   78   Resp: 14 14 14 15   Temp:   98.4 F (36.9 C)   TempSrc:   Bladder   SpO2: 99% 98% 98% 97%  Weight:      Height:       Physical Exam  Constitutional: In no distress.   HEENT: L parotid gland TTP, overlying skin mildly erythematous Cardiovascular: Normal rate, regular rhythm. No lower extremity edema  Pulmonary: Non labored breathing on room air, no wheezing or rales.  Abdominal: Soft. Normal bowel sounds. Non distended, diffusely tender to minimal palpation  Musculoskeletal: Normal range of motion.     Neurological: Alert and oriented to person, place, but not time. Non focal  Skin: Skin is warm and dry. +Hirsutisum   Data Reviewed: I have reviewed all images and labs.     Latest Ref Rng & Units 09/05/2023    4:51 AM 09/04/2023   10:41 AM 03/03/2021   10:51 AM  BMP  Glucose 70 - 99 mg/dL 99  783  82   BUN 8 - 23 mg/dL 97  885  12   Creatinine 0.44 - 1.00 mg/dL 6.98  5.38  9.12   Sodium 135 - 145 mmol/L 143  144  142   Potassium 3.5 - 5.1 mmol/L 3.3  3.8  3.6   Chloride 98 - 111 mmol/L 107  100  108   CO2 22 - 32 mmol/L 19  18  23    Calcium 8.9 - 10.3 mg/dL 8.0  8.6  9.0       Latest Ref Rng & Units 09/05/2023    4:51 AM 09/04/2023   10:41 AM 03/03/2021   10:51 AM  CBC  WBC 4.0 - 10.5 K/uL 23.9  32.9  8.0   Hemoglobin 12.0 - 15.0 g/dL 85.5  82.1  86.3   Hematocrit 36.0 - 46.0 % 42.3  52.0  42.1   Platelets 150 - 400 K/uL 133  193  194    US  SOFT TISSUE HEAD & NECK (NON-THYROID ) Result Date: 09/04/2023 CLINICAL DATA:  Left parotid gland swelling. EXAM: ULTRASOUND OF HEAD/NECK SOFT TISSUES TECHNIQUE: Ultrasound examination of the head and neck soft tissues was performed in the area of clinical concern. COMPARISON:  Cervical spine CT 09/03/2022 FINDINGS: Targeted ultrasound was performed over the area of concern within the left parotid gland. No underlying mass or focal fluid collection identified. The parotid gland appears heterogeneous and enlarged. IMPRESSION: 1. No underlying mass or focal fluid collection identified. 2. The left parotid gland appears heterogeneous and enlarged. This is nonspecific but may reflect parotiditis. Electronically Signed   By:  Waddell Calk M.D.   On: 09/04/2023 15:57   CT T-SPINE NO CHARGE Result Date: 09/04/2023 CLINICAL DATA:  Ground level fall. Hypoxia and tachycardia. Concern for traumatic injury to the thoracic area. EXAM: CT Thoracic and Lumbar spine without contrast TECHNIQUE: Multiplanar CT images of the thoracic and lumbar spine were reconstructed from contemporary CT of the Chest, Abdomen, and Pelvis. RADIATION DOSE REDUCTION: This exam was performed according to the departmental dose-optimization program which includes automated exposure control, adjustment of the mA and/or kV according to patient size and/or use of iterative reconstruction technique. CONTRAST:  No additional COMPARISON:  Chest CT 04/07/2019.  Abdominopelvic CT 06/25/2017. FINDINGS: CT THORACIC SPINE FINDINGS Alignment: Normal. Vertebrae: Compared with the previous chest CT, there is new endplate sclerosis in the superior endplate of T3 consistent with an age indeterminate fracture, potentially acute.  Less than 10% loss of vertebral body height and no osseous retropulsion. No other evidence of acute thoracic spine injury. Paraspinal and other soft tissues: No acute paraspinal findings are identified. Chest CT findings dictated separately. Disc levels: Mild multilevel spondylosis with disc space narrowing and endplate osteophytes. No evidence of large disc herniation or high-grade spinal stenosis. CT LUMBAR SPINE FINDINGS Segmentation: There are 5 lumbar type vertebral bodies. Alignment: Normal. Vertebrae: The bones appear mildly demineralized. There is a new superior endplate Schmorl's node at L5 compared with previous abdominal CT. This demonstrates no definite acute features. No evidence of acute lumbar spine fracture or traumatic subluxation. Paraspinal and other soft tissues: No acute paraspinal findings. Abdominopelvic CT findings dictated separately. Disc levels: Mild multilevel spondylosis with mild disc space narrowing and endplate osteophyte  formation inferiorly. Moderate to advanced facet disease from L3-4 through L5-S1 contributing to mild foraminal narrowing. IMPRESSION: 1. Age indeterminate fracture of the superior endplate of T3, potentially acute. Less than 10% loss of vertebral body height and no osseous retropulsion. 2. No other evidence of acute thoracic or lumbar spine injury. 3. Mild multilevel spondylosis. 4. Chest, abdomen and pelvic CT findings dictated separately. Electronically Signed   By: Elsie Perone M.D.   On: 09/04/2023 12:59   CT L-SPINE NO CHARGE Result Date: 09/04/2023 CLINICAL DATA:  Ground level fall. Hypoxia and tachycardia. Concern for traumatic injury to the thoracic area. EXAM: CT Thoracic and Lumbar spine without contrast TECHNIQUE: Multiplanar CT images of the thoracic and lumbar spine were reconstructed from contemporary CT of the Chest, Abdomen, and Pelvis. RADIATION DOSE REDUCTION: This exam was performed according to the departmental dose-optimization program which includes automated exposure control, adjustment of the mA and/or kV according to patient size and/or use of iterative reconstruction technique. CONTRAST:  No additional COMPARISON:  Chest CT 04/07/2019.  Abdominopelvic CT 06/25/2017. FINDINGS: CT THORACIC SPINE FINDINGS Alignment: Normal. Vertebrae: Compared with the previous chest CT, there is new endplate sclerosis in the superior endplate of T3 consistent with an age indeterminate fracture, potentially acute. Less than 10% loss of vertebral body height and no osseous retropulsion. No other evidence of acute thoracic spine injury. Paraspinal and other soft tissues: No acute paraspinal findings are identified. Chest CT findings dictated separately. Disc levels: Mild multilevel spondylosis with disc space narrowing and endplate osteophytes. No evidence of large disc herniation or high-grade spinal stenosis. CT LUMBAR SPINE FINDINGS Segmentation: There are 5 lumbar type vertebral bodies. Alignment:  Normal. Vertebrae: The bones appear mildly demineralized. There is a new superior endplate Schmorl's node at L5 compared with previous abdominal CT. This demonstrates no definite acute features. No evidence of acute lumbar spine fracture or traumatic subluxation. Paraspinal and other soft tissues: No acute paraspinal findings. Abdominopelvic CT findings dictated separately. Disc levels: Mild multilevel spondylosis with mild disc space narrowing and endplate osteophyte formation inferiorly. Moderate to advanced facet disease from L3-4 through L5-S1 contributing to mild foraminal narrowing. IMPRESSION: 1. Age indeterminate fracture of the superior endplate of T3, potentially acute. Less than 10% loss of vertebral body height and no osseous retropulsion. 2. No other evidence of acute thoracic or lumbar spine injury. 3. Mild multilevel spondylosis. 4. Chest, abdomen and pelvic CT findings dictated separately. Electronically Signed   By: Elsie Perone M.D.   On: 09/04/2023 12:59   CT Head Wo Contrast Result Date: 09/04/2023 CLINICAL DATA:  Ground level fall with head injury EXAM: CT HEAD WITHOUT CONTRAST CT CERVICAL SPINE WITHOUT CONTRAST TECHNIQUE: Multidetector CT imaging of  the head and cervical spine was performed following the standard protocol without intravenous contrast. Multiplanar CT image reconstructions of the cervical spine were also generated. RADIATION DOSE REDUCTION: This exam was performed according to the departmental dose-optimization program which includes automated exposure control, adjustment of the mA and/or kV according to patient size and/or use of iterative reconstruction technique. COMPARISON:  07/05/2009 FINDINGS: CT HEAD FINDINGS Brain: No evidence of acute infarction, hemorrhage, hydrocephalus, extra-axial collection or mass lesion/mass effect. Chronic infarcts scattered along the left frontoparietal convexity and in the left basal ganglia which are in the MCA distribution. Chronic  appearing lacunar infarct at the right thalamus. Vascular: No hyperdense vessel or unexpected calcification. Skull: No evidence of fracture Sinuses/Orbits: No evidence of injury. Retention cyst appearance in the inferior right maxillary sinus. CT CERVICAL SPINE FINDINGS Alignment: No traumatic malalignment Skull base and vertebrae: No acute fracture. Soft tissues and spinal canal: Asymmetric parotid size, larger on the left where there is mild fat stranding, favor left parotiditis rather than right parotid atrophy. Disc levels: Multilevel degenerative endplate and facet spurring. No high-grade bony stenosis. Upper chest: Reported separately. IMPRESSION: 1. Expanded left parotid suggesting parotiditis, correlate for regional symptoms. 2. No acute intracranial or cervical spine finding. 3. 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 Electronically Signed   By: Dorn Roulette M.D.   On: 09/04/2023 12:52   CT Cervical Spine Wo Contrast Result Date: 09/04/2023 CLINICAL DATA:  Ground level fall with head injury EXAM: CT HEAD WITHOUT CONTRAST CT CERVICAL SPINE WITHOUT CONTRAST TECHNIQUE: Multidetector CT imaging of the head and cervical spine was performed following the standard protocol without intravenous contrast. Multiplanar CT image reconstructions of the cervical spine were also generated. RADIATION DOSE REDUCTION: This exam was performed according to the departmental dose-optimization program which includes automated exposure control, adjustment of the mA and/or kV according to patient size and/or use of iterative reconstruction technique. COMPARISON:  07/05/2009 FINDINGS: CT HEAD FINDINGS Brain: No evidence of acute infarction, hemorrhage, hydrocephalus, extra-axial collection or mass lesion/mass effect. Chronic infarcts scattered along the left frontoparietal convexity and in the left basal ganglia which are in the MCA distribution. Chronic appearing lacunar infarct at the right  thalamus. Vascular: No hyperdense vessel or unexpected calcification. Skull: No evidence of fracture Sinuses/Orbits: No evidence of injury. Retention cyst appearance in the inferior right maxillary sinus. CT CERVICAL SPINE FINDINGS Alignment: No traumatic malalignment Skull base and vertebrae: No acute fracture. Soft tissues and spinal canal: Asymmetric parotid size, larger on the left where there is mild fat stranding, favor left parotiditis rather than right parotid atrophy. Disc levels: Multilevel degenerative endplate and facet spurring. No high-grade bony stenosis. Upper chest: Reported separately. IMPRESSION: 1. Expanded left parotid suggesting parotiditis, correlate for regional symptoms. 2. No acute intracranial or cervical spine finding. 3. 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 Electronically Signed   By: Dorn Roulette M.D.   On: 09/04/2023 12:52   CT CHEST ABDOMEN PELVIS WO CONTRAST Result Date: 09/04/2023 CLINICAL DATA:  Ground level fall.  Hypoxia and tachycardia. EXAM: CT CHEST, ABDOMEN AND PELVIS WITHOUT CONTRAST TECHNIQUE: Multidetector CT imaging of the chest, abdomen and pelvis was performed following the standard protocol without IV contrast. RADIATION DOSE REDUCTION: This exam was performed according to the departmental dose-optimization program which includes automated exposure control, adjustment of the mA and/or kV according to patient size and/or use of iterative reconstruction technique. COMPARISON:  Chest radiograph same date. Abdominopelvic CT 06/25/2017. Chest  CT 04/07/2019. Esophagram 12/26/2019. FINDINGS: CT CHEST FINDINGS Cardiovascular: Atherosclerosis of the aorta, great vessels and coronary arteries. The heart size is normal. There is no pericardial effusion. Mediastinum/Nodes: There are no enlarged mediastinal, hilar or axillary lymph nodes. Chronic dilatation of the thoracic esophagus is again noted. The esophagus is diffusely fluid-filled.  The thyroid  gland and trachea appear unremarkable. Lungs/Pleura: No pleural effusion or pneumothorax. Azygous fissure noted. There is mild biapical scarring. There is increased central airway thickening and patchy airspace opacities at both lung bases, favoring atelectasis. No consolidation. Musculoskeletal/Chest wall: No chest wall mass or aggressive osseous lesions. There is new superior endplate sclerosis at T3 consistent with a mild age-indeterminate compression deformity. Additional spine details dictated separately. CT ABDOMEN AND PELVIS FINDINGS Hepatobiliary: Heterogeneous low density throughout the liver most consistent with heterogeneous hepatic steatosis. No focally suspicious lesions identified on noncontrast imaging. No evidence of gallstones, gallbladder wall thickening or biliary dilatation. Pancreas: Unremarkable. No pancreatic ductal dilatation or surrounding inflammatory changes. Spleen: Normal in size without focal abnormality. Adrenals/Urinary Tract: The adrenal glands are unchanged with low-density thickening bilaterally consistent with chronic hyperplasia. No specific follow-up imaging recommended. There are punctate nonobstructing bilateral renal calculi. No evidence of ureteral calculus or hydronephrosis. No suspicious renal lesion. The bladder appears normal for its degree of distention. Stomach/Bowel: No enteric contrast administered. The stomach appears unremarkable for its degree of distension. No evidence of bowel wall thickening, distention or surrounding inflammatory change. Mildly prominent stool throughout the colon. Vascular/Lymphatic: There are no enlarged abdominal or pelvic lymph nodes. Mild aortic and branch vessel atherosclerosis without evidence of aneurysm. Reproductive: Status post hysterectomy.  No adnexal mass. Other: No evidence of abdominal wall mass or hernia. No ascites or pneumoperitoneum. Musculoskeletal: Interval Schmorl's node involving the superior endplate of L5.  Spine details dictated separately. No definite acute osseous findings. Chronic sacroiliac degenerative changes bilaterally. IMPRESSION: 1. No definite acute traumatic findings identified in the chest, abdomen or pelvis. 2. New superior endplate sclerosis at T3 consistent with a mild age-indeterminate compression deformity. Thoracolumbar spine details dictated separately. 3. 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 Correlate clinically. 4. Increased central airway thickening and patchy airspace opacities at both lung bases, favoring atelectasis. 5. Heterogeneous hepatic steatosis. 6. Punctate nonobstructing bilateral renal calculi. 7.  Aortic Atherosclerosis (ICD10-I70.0). Electronically Signed   By: Elsie Perone M.D.   On: 09/04/2023 12:50   DG Chest Port 1 View Result Date: 09/04/2023 CLINICAL DATA:  Questionable sepsis. EXAM: PORTABLE CHEST 1 VIEW COMPARISON:  05/26/2019 FINDINGS: Heart size is normal. No pleural fluid, interstitial edema, or airspace consolidation. The visualized osseous structures are unremarkable. IMPRESSION: 1. No acute findings. Electronically Signed   By: Waddell Calk M.D.   On: 09/04/2023 09:51     Family Communication: None  Disposition: Status is: Inpatient Remains inpatient appropriate because: Acute kidney injury,   Planned Discharge Destination:  Pending clinical course     Time spent: 35 minutes  Author: Alban Pepper, MD 09/05/2023 9:41 AM  For on call review [email or web address].

## 2023-09-06 ENCOUNTER — Encounter: Payer: Self-pay | Admitting: Internal Medicine

## 2023-09-06 DIAGNOSIS — A419 Sepsis, unspecified organism: Secondary | ICD-10-CM | POA: Diagnosis not present

## 2023-09-06 DIAGNOSIS — R6520 Severe sepsis without septic shock: Secondary | ICD-10-CM | POA: Diagnosis not present

## 2023-09-06 LAB — BASIC METABOLIC PANEL
Anion gap: 11 (ref 5–15)
BUN: 78 mg/dL — ABNORMAL HIGH (ref 8–23)
CO2: 22 mmol/L (ref 22–32)
Calcium: 8.4 mg/dL — ABNORMAL LOW (ref 8.9–10.3)
Chloride: 113 mmol/L — ABNORMAL HIGH (ref 98–111)
Creatinine, Ser: 1.81 mg/dL — ABNORMAL HIGH (ref 0.44–1.00)
GFR, Estimated: 31 mL/min — ABNORMAL LOW (ref >60)
Glucose, Bld: 86 mg/dL (ref 70–99)
Potassium: 3.9 mmol/L (ref 3.5–5.1)
Sodium: 146 mmol/L — ABNORMAL HIGH (ref 135–145)

## 2023-09-06 LAB — CBC WITH DIFFERENTIAL/PLATELET
Abs Immature Granulocytes: 0.48 10*3/uL — ABNORMAL HIGH (ref 0.00–0.07)
Basophils Absolute: 0.1 10*3/uL (ref 0.0–0.1)
Basophils Relative: 0 %
Eosinophils Absolute: 0 10*3/uL (ref 0.0–0.5)
Eosinophils Relative: 0 %
HCT: 41.3 % (ref 36.0–46.0)
Hemoglobin: 13.5 g/dL (ref 12.0–15.0)
Immature Granulocytes: 2 %
Lymphocytes Relative: 3 %
Lymphs Abs: 0.7 10*3/uL (ref 0.7–4.0)
MCH: 31.7 pg (ref 26.0–34.0)
MCHC: 32.7 g/dL (ref 30.0–36.0)
MCV: 96.9 fL (ref 80.0–100.0)
Monocytes Absolute: 0.4 10*3/uL (ref 0.1–1.0)
Monocytes Relative: 2 %
Neutro Abs: 21.9 10*3/uL — ABNORMAL HIGH (ref 1.7–7.7)
Neutrophils Relative %: 93 %
Platelets: 87 10*3/uL — ABNORMAL LOW (ref 150–400)
RBC: 4.26 MIL/uL (ref 3.87–5.11)
RDW: 14.7 % (ref 11.5–15.5)
Smear Review: NORMAL
WBC: 23.5 10*3/uL — ABNORMAL HIGH (ref 4.0–10.5)
nRBC: 0 % (ref 0.0–0.2)

## 2023-09-06 LAB — URINE CULTURE: Culture: <10000 — AB

## 2023-09-06 LAB — HEMOGLOBIN A1C
Hgb A1c MFr Bld: 6 % — ABNORMAL HIGH (ref 4.8–5.6)
Mean Plasma Glucose: 126 mg/dL

## 2023-09-06 LAB — VANCOMYCIN, RANDOM: Vancomycin Rm: 14 ug/mL

## 2023-09-06 LAB — CBG MONITORING, ED
Glucose-Capillary: 69 mg/dL — ABNORMAL LOW (ref 70–99)
Glucose-Capillary: 30 mg/dL — CL (ref 70–99)
Glucose-Capillary: 133 mg/dL — ABNORMAL HIGH (ref 70–99)

## 2023-09-06 LAB — MAGNESIUM: Magnesium: 2.4 mg/dL (ref 1.7–2.4)

## 2023-09-06 LAB — MRSA NEXT GEN BY PCR, NASAL: MRSA by PCR Next Gen: NOT DETECTED

## 2023-09-06 LAB — GLUCOSE, CAPILLARY: Glucose-Capillary: 98 mg/dL (ref 70–99)

## 2023-09-06 MED ORDER — ENOXAPARIN SODIUM 40 MG/0.4ML IJ SOSY
40.0000 mg | PREFILLED_SYRINGE | INTRAMUSCULAR | Status: DC
  Administered 2023-09-06: 40 mg via SUBCUTANEOUS
  Filled 2023-09-06: qty 0.4

## 2023-09-06 MED ORDER — CHLORHEXIDINE GLUCONATE CLOTH 2 % EX PADS
6.0000 | MEDICATED_PAD | Freq: Every day | CUTANEOUS | Status: DC
  Administered 2023-09-07 – 2023-09-17 (×12): 6 via TOPICAL

## 2023-09-06 MED ORDER — DEXTROSE 50 % IV SOLN
INTRAVENOUS | Status: AC
  Filled 2023-09-06: qty 50

## 2023-09-06 MED ORDER — DOXYCYCLINE HYCLATE 100 MG PO TABS
100.0000 mg | ORAL_TABLET | Freq: Two times a day (BID) | ORAL | Status: DC
  Administered 2023-09-06: 100 mg via ORAL
  Filled 2023-09-06: qty 1

## 2023-09-06 MED ORDER — CHLORHEXIDINE GLUCONATE CLOTH 2 % EX PADS: 6.0000 | MEDICATED_PAD | Freq: Every day | CUTANEOUS | Status: DC

## 2023-09-06 MED ORDER — ENSURE ENLIVE PO LIQD: 237.0000 mL | Freq: Two times a day (BID) | ORAL | Status: DC

## 2023-09-06 NOTE — ED Notes (Signed)
 Pt unable to tolerate apple sauce. New orders placed at this time.

## 2023-09-06 NOTE — ED Notes (Signed)
 Bladder scan completed and O volume in bladder at this time.

## 2023-09-06 NOTE — Plan of Care (Signed)

## 2023-09-06 NOTE — ED Notes (Signed)
 Pt given grapejuice at this time. Pt did not eat breakfast. Pt sitting up and able to drink and tolerate the fluids.

## 2023-09-06 NOTE — Progress Notes (Signed)
 Progress Note   Patient: Kaitlyn Hodges FMW:[id number] DOB: 06-30-1961 DOA: 09/04/2023     2 DOS: the patient was seen and examined on 09/06/2023      Brief hospital course: Shaynna Husby is a 63 y.o. female with medical history significant of prediabetes, CVA, seizure disorder, type I achalasia,tobacco use disorder, erosive esophagitis who presents to the ED due to altered mental status.      Patient's daughter noted patient had dyspnea and cough for a month prior to admission and for the last week she had N/V and poor oral intake.    Patient was afebrile, hypotensive and tachycardic on vitals. She had a mild acidosis with increased AG. Noted lactate of 7.7. WBC of 32.9. BUN/cr 114/4.61. COVID, flu, RSV negative. Pan scan notable for possible L parotiditis, central airway thickening and patchy airspace opacities in the bases, age indeterminate T3 fracture.    Patient was started on broad spectrum antibiotics and fluid resuscitated.        Assessment and Plan: Severe sepsis, resolving  Patient presenting with hypotension, tachycardia, marked leukocytosis and lactic acidosis with suspicion for underlying infection, although of unknown source.     The possible source of her infection include  acute on chronic aspiration pneumonia (given severe achalasia and shortness of breath/cough for 1 month), versus viral source (given left parotiditis and reported nausea/vomiting for 1 week).   Resp viral panel negative (flu/RSV/COVID), L parotid US  w/o abscess, procal elevated.BC NG<24h   Plan:  Continue antibiotics Follow-up on culture results    AKI (acute kidney injury) (HCC) 2/2 hypovolemia and severe sepsis. In the setting of poor p.o. intake, severe uncontrolled achalasia Continue hydration Monitor renal function closely   Acute encephalopathy Improving  Encephalopathy improved Continue aspiration precaution   Achalasia Patient follows up with GI outpatient   Pre-diabetes Notable  hyperglycemia seen on admission, potentially stress response given history of prediabetes only with last A1c of 5.8%. Follow-up on A1c Continue monitoring glucose level   Closed wedge compression fracture of T3 vertebra (HCC) Incidental finding of a T3 compression fracture on imaging.  Patient's daughter reports a fall 2 weeks ago but states her mom did not complain of any pain afterwards.  Given mild nature (less than 10%) and age-indeterminate, will hold off on neurosurgery consultation. Continue pain management   History of CVA (cerebrovascular accident) Per chart review, patient has a history of CVA dating back to 2010.  CT of the head obtained showed chronic infarcts scattered along the left frontoparietal convexity in the left basal ganglia, as well as lacunar infarcts in the right thalamus.  No acute findings.  Does not appear patient is on statin or aspirin.   Seizure disorder [facility]) Per chart review, there is a reported history of seizure disorder that began after CVA in 2010.  No reported seizures since that time. Continue seizure precaution       Subjective:  Patient seen and examined at bedside this morning Denies nausea vomiting and no pain chest pain cough   Physical Exam:  Constitutional: In no distress.  HEENT: L parotid gland TTP, overlying skin mildly erythematous Cardiovascular: Normal rate, regular rhythm. No lower extremity edema  Pulmonary: Non labored breathing on room air, no wheezing or rales.  Abdominal: Soft. Normal bowel sounds. Non distended, diffusely tender to minimal palpation  Musculoskeletal: Normal range of motion.     Neurological: Alert and oriented to person, place, but not time. Non focal  Skin: Skin is warm and dry. +Hirsutisum  Vitals:   09/06/23 1100 09/06/23 1106 09/06/23 1109 09/06/23 1350  BP: 131/77   (!) 121/91  Pulse:    (!) 109  Resp: (!) 22 19 18 20   Temp:    99.3 F (37.4 C)  TempSrc:      SpO2: 95% 96% 95% 96%  Weight:       Height:        Data Reviewed:    Latest Ref Rng & Units 09/06/2023    6:40 AM 09/05/2023    4:51 AM 09/04/2023   10:41 AM  BMP  Glucose 70 - 99 mg/dL 86  99  783   BUN 8 - 23 mg/dL 78  97  885   Creatinine 0.44 - 1.00 mg/dL 8.18  6.98  5.38   Sodium 135 - 145 mmol/L 146  143  144   Potassium 3.5 - 5.1 mmol/L 3.9  3.3  3.8   Chloride 98 - 111 mmol/L 113  107  100   CO2 22 - 32 mmol/L 22  19  18    Calcium 8.9 - 10.3 mg/dL 8.4  8.0  8.6        Latest Ref Rng & Units 09/06/2023    6:40 AM 09/05/2023    4:51 AM 09/04/2023   10:41 AM  CBC  WBC 4.0 - 10.5 K/uL 23.5  23.9  32.9   Hemoglobin 12.0 - 15.0 g/dL 86.4  85.5  82.1   Hematocrit 36.0 - 46.0 % 41.3  42.3  52.0   Platelets 150 - 400 K/uL 87  133  193      Author: Drue ONEIDA Potter, MD 09/06/2023 6:27 PM  For on call review [email or web address].

## 2023-09-06 NOTE — ED Notes (Signed)
 Daughter at bedside with pt. Pt attempting to eat.

## 2023-09-06 NOTE — ED Notes (Signed)
 Pt given juice and D50

## 2023-09-06 NOTE — ED Notes (Signed)
 Victorino Dike RN called and updated on pt care and new issues.

## 2023-09-06 NOTE — ED Notes (Signed)
 Pt did vomit and was cleaned up. CBG rechecked and it s133. Pt transported to floor at this time. Will call and updated the receiving nurse.

## 2023-09-07 ENCOUNTER — Inpatient Hospital Stay: Payer: Medicaid Other

## 2023-09-07 ENCOUNTER — Inpatient Hospital Stay (HOSPITAL_COMMUNITY)
Admit: 2023-09-07 | Discharge: 2023-09-07 | Disposition: A | Discharge status: Home / Self Care | Payer: Medicaid Other | Attending: Student in an Organized Health Care Education/Training Program

## 2023-09-07 DIAGNOSIS — G9340 Encephalopathy, unspecified: Secondary | ICD-10-CM | POA: Diagnosis not present

## 2023-09-07 DIAGNOSIS — A419 Sepsis, unspecified organism: Secondary | ICD-10-CM | POA: Diagnosis not present

## 2023-09-07 DIAGNOSIS — R6520 Severe sepsis without septic shock: Secondary | ICD-10-CM | POA: Diagnosis not present

## 2023-09-07 DIAGNOSIS — K921 Melena: Secondary | ICD-10-CM | POA: Insufficient documentation

## 2023-09-07 DIAGNOSIS — L8990 Pressure ulcer of unspecified site, unspecified stage: Secondary | ICD-10-CM | POA: Diagnosis present

## 2023-09-07 DIAGNOSIS — R578 Other shock: Secondary | ICD-10-CM | POA: Diagnosis present

## 2023-09-07 DIAGNOSIS — N179 Acute kidney failure, unspecified: Secondary | ICD-10-CM | POA: Diagnosis not present

## 2023-09-07 DIAGNOSIS — E872 Acidosis, unspecified: Secondary | ICD-10-CM | POA: Insufficient documentation

## 2023-09-07 DIAGNOSIS — K922 Gastrointestinal hemorrhage, unspecified: Secondary | ICD-10-CM | POA: Diagnosis not present

## 2023-09-07 LAB — CBC WITH DIFFERENTIAL/PLATELET
Abs Immature Granulocytes: 0.31 10*3/uL — ABNORMAL HIGH (ref 0.00–0.07)
Abs Immature Granulocytes: 0.21 10*3/uL — ABNORMAL HIGH (ref 0.00–0.07)
Abs Immature Granulocytes: 0.25 10*3/uL — ABNORMAL HIGH (ref 0.00–0.07)
Abs Immature Granulocytes: 0.27 10*3/uL — ABNORMAL HIGH (ref 0.00–0.07)
Basophils Absolute: 0.1 10*3/uL (ref 0.0–0.1)
Basophils Absolute: 0 10*3/uL (ref 0.0–0.1)
Basophils Absolute: 0.1 10*3/uL (ref 0.0–0.1)
Basophils Absolute: 0.1 10*3/uL (ref 0.0–0.1)
Basophils Relative: 0 %
Basophils Relative: 0 %
Basophils Relative: 0 %
Basophils Relative: 0 %
Eosinophils Absolute: 0 10*3/uL (ref 0.0–0.5)
Eosinophils Absolute: 0 10*3/uL (ref 0.0–0.5)
Eosinophils Absolute: 0.2 10*3/uL (ref 0.0–0.5)
Eosinophils Absolute: 0 10*3/uL (ref 0.0–0.5)
Eosinophils Relative: 0 %
Eosinophils Relative: 0 %
Eosinophils Relative: 1 %
Eosinophils Relative: 0 %
HCT: 33.4 % — ABNORMAL LOW (ref 36.0–46.0)
HCT: 24.4 % — ABNORMAL LOW (ref 36.0–46.0)
HCT: 32.7 % — ABNORMAL LOW (ref 36.0–46.0)
HCT: 35.3 % — ABNORMAL LOW (ref 36.0–46.0)
Hemoglobin: 11.2 g/dL — ABNORMAL LOW (ref 12.0–15.0)
Hemoglobin: 7.6 g/dL — ABNORMAL LOW (ref 12.0–15.0)
Hemoglobin: 10.6 g/dL — ABNORMAL LOW (ref 12.0–15.0)
Hemoglobin: 11.3 g/dL — ABNORMAL LOW (ref 12.0–15.0)
Immature Granulocytes: 1 %
Immature Granulocytes: 1 %
Immature Granulocytes: 1 %
Immature Granulocytes: 1 %
Lymphocytes Relative: 1 %
Lymphocytes Relative: 3 %
Lymphocytes Relative: 3 %
Lymphocytes Relative: 4 %
Lymphs Abs: 0.4 10*3/uL — ABNORMAL LOW (ref 0.7–4.0)
Lymphs Abs: 0.5 10*3/uL — ABNORMAL LOW (ref 0.7–4.0)
Lymphs Abs: 0.7 10*3/uL (ref 0.7–4.0)
Lymphs Abs: 0.8 10*3/uL (ref 0.7–4.0)
MCH: 32.9 pg (ref 26.0–34.0)
MCH: 32.2 pg (ref 26.0–34.0)
MCH: 31.9 pg (ref 26.0–34.0)
MCH: 31.7 pg (ref 26.0–34.0)
MCHC: 33.5 g/dL (ref 30.0–36.0)
MCHC: 31.1 g/dL (ref 30.0–36.0)
MCHC: 32.4 g/dL (ref 30.0–36.0)
MCHC: 32 g/dL (ref 30.0–36.0)
MCV: 98.2 fL (ref 80.0–100.0)
MCV: 103.4 fL — ABNORMAL HIGH (ref 80.0–100.0)
MCV: 98.5 fL (ref 80.0–100.0)
MCV: 98.9 fL (ref 80.0–100.0)
Monocytes Absolute: 0.4 10*3/uL (ref 0.1–1.0)
Monocytes Absolute: 0.3 10*3/uL (ref 0.1–1.0)
Monocytes Absolute: 0.3 10*3/uL (ref 0.1–1.0)
Monocytes Absolute: 0.4 10*3/uL (ref 0.1–1.0)
Monocytes Relative: 1 %
Monocytes Relative: 2 %
Monocytes Relative: 1 %
Monocytes Relative: 2 %
Neutro Abs: 26.6 10*3/uL — ABNORMAL HIGH (ref 1.7–7.7)
Neutro Abs: 18.5 10*3/uL — ABNORMAL HIGH (ref 1.7–7.7)
Neutro Abs: 23.2 10*3/uL — ABNORMAL HIGH (ref 1.7–7.7)
Neutro Abs: 19.4 10*3/uL — ABNORMAL HIGH (ref 1.7–7.7)
Neutrophils Relative %: 97 %
Neutrophils Relative %: 94 %
Neutrophils Relative %: 94 %
Neutrophils Relative %: 93 %
Platelets: 81 10*3/uL — ABNORMAL LOW (ref 150–400)
Platelets: 62 10*3/uL — ABNORMAL LOW (ref 150–400)
Platelets: 62 10*3/uL — ABNORMAL LOW (ref 150–400)
Platelets: 108 10*3/uL — ABNORMAL LOW (ref 150–400)
RBC: 3.4 MIL/uL — ABNORMAL LOW (ref 3.87–5.11)
RBC: 2.36 MIL/uL — ABNORMAL LOW (ref 3.87–5.11)
RBC: 3.32 MIL/uL — ABNORMAL LOW (ref 3.87–5.11)
RBC: 3.57 MIL/uL — ABNORMAL LOW (ref 3.87–5.11)
RDW: 14.7 % (ref 11.5–15.5)
RDW: 15.4 % (ref 11.5–15.5)
RDW: 16.4 % — ABNORMAL HIGH (ref 11.5–15.5)
RDW: 16.9 % — ABNORMAL HIGH (ref 11.5–15.5)
Smear Review: NORMAL
Smear Review: NORMAL
WBC: 27.7 10*3/uL — ABNORMAL HIGH (ref 4.0–10.5)
WBC: 19.5 10*3/uL — ABNORMAL HIGH (ref 4.0–10.5)
WBC: 24.6 10*3/uL — ABNORMAL HIGH (ref 4.0–10.5)
WBC: 20.8 10*3/uL — ABNORMAL HIGH (ref 4.0–10.5)
nRBC: 0.2 % (ref 0.0–0.2)
nRBC: 0.2 % (ref 0.0–0.2)
nRBC: 0.2 % (ref 0.0–0.2)
nRBC: 0.3 % — ABNORMAL HIGH (ref 0.0–0.2)

## 2023-09-07 LAB — GLUCOSE, CAPILLARY
Glucose-Capillary: 136 mg/dL — ABNORMAL HIGH (ref 70–99)
Glucose-Capillary: 128 mg/dL — ABNORMAL HIGH (ref 70–99)
Glucose-Capillary: 156 mg/dL — ABNORMAL HIGH (ref 70–99)
Glucose-Capillary: 208 mg/dL — ABNORMAL HIGH (ref 70–99)
Glucose-Capillary: 155 mg/dL — ABNORMAL HIGH (ref 70–99)
Glucose-Capillary: 145 mg/dL — ABNORMAL HIGH (ref 70–99)

## 2023-09-07 LAB — BLOOD GAS, VENOUS
Acid-base deficit: 15.5 mmol/L — ABNORMAL HIGH (ref 0.0–2.0)
Acid-base deficit: 18.7 mmol/L — ABNORMAL HIGH (ref 0.0–2.0)
Acid-base deficit: 15.5 mmol/L — ABNORMAL HIGH (ref 0.0–2.0)
Bicarbonate: 8.4 mmol/L — ABNORMAL LOW (ref 20.0–28.0)
Bicarbonate: 6 mmol/L — ABNORMAL LOW (ref 20.0–28.0)
Bicarbonate: 8.9 mmol/L — ABNORMAL LOW (ref 20.0–28.0)
O2 Saturation: 73 %
O2 Saturation: 99.9 %
O2 Saturation: 62.7 %
Patient temperature: 37
Patient temperature: 37
Patient temperature: 37
pCO2, Ven: <18 mm[Hg] — CL (ref 44–60)
pCO2, Ven: <18 mm[Hg] — CL (ref 44–60)
pCO2, Ven: 19 mm[Hg] — CL (ref 44–60)
pH, Ven: 7.3 (ref 7.25–7.43)
pH, Ven: 7.24 — ABNORMAL LOW (ref 7.25–7.43)
pH, Ven: 7.28 (ref 7.25–7.43)
pO2, Ven: 45 mm[Hg] (ref 32–45)
pO2, Ven: 178 mm[Hg] — ABNORMAL HIGH (ref 32–45)
pO2, Ven: 35 mm[Hg] (ref 32–45)

## 2023-09-07 LAB — BASIC METABOLIC PANEL
Anion gap: 24 — ABNORMAL HIGH (ref 5–15)
BUN: 81 mg/dL — ABNORMAL HIGH (ref 8–23)
CO2: 7 mmol/L — ABNORMAL LOW (ref 22–32)
Calcium: 8.2 mg/dL — ABNORMAL LOW (ref 8.9–10.3)
Chloride: 119 mmol/L — ABNORMAL HIGH (ref 98–111)
Creatinine, Ser: 2.14 mg/dL — ABNORMAL HIGH (ref 0.44–1.00)
GFR, Estimated: 25 mL/min — ABNORMAL LOW (ref >60)
Glucose, Bld: 156 mg/dL — ABNORMAL HIGH (ref 70–99)
Potassium: 3.2 mmol/L — ABNORMAL LOW (ref 3.5–5.1)
Sodium: 150 mmol/L — ABNORMAL HIGH (ref 135–145)

## 2023-09-07 LAB — COMPREHENSIVE METABOLIC PANEL
ALT: 24 U/L (ref 0–44)
AST: 35 U/L (ref 15–41)
Albumin: 1.9 g/dL — ABNORMAL LOW (ref 3.5–5.0)
Alkaline Phosphatase: 75 U/L (ref 38–126)
Anion gap: 21 — ABNORMAL HIGH (ref 5–15)
BUN: 76 mg/dL — ABNORMAL HIGH (ref 8–23)
CO2: 14 mmol/L — ABNORMAL LOW (ref 22–32)
Calcium: 8 mg/dL — ABNORMAL LOW (ref 8.9–10.3)
Chloride: 117 mmol/L — ABNORMAL HIGH (ref 98–111)
Creatinine, Ser: 1.82 mg/dL — ABNORMAL HIGH (ref 0.44–1.00)
GFR, Estimated: 31 mL/min — ABNORMAL LOW (ref >60)
Glucose, Bld: 130 mg/dL — ABNORMAL HIGH (ref 70–99)
Potassium: 2.9 mmol/L — ABNORMAL LOW (ref 3.5–5.1)
Sodium: 152 mmol/L — ABNORMAL HIGH (ref 135–145)
Total Bilirubin: 1 mg/dL (ref 0.0–1.2)
Total Protein: 4.5 g/dL — ABNORMAL LOW (ref 6.5–8.1)

## 2023-09-07 LAB — ECHOCARDIOGRAM COMPLETE
AR max vel: 3 cm2
AV Area VTI: 3.37 cm2
AV Area mean vel: 2.91 cm2
AV Mean grad: 6 mm[Hg]
AV Peak grad: 9.4 mm[Hg]
Ao pk vel: 1.53 m/s
Area-P 1/2: 2.74 cm2
Height: 66 in
MV VTI: 5.29 cm2
S' Lateral: 1.6 cm
Weight: 2560.86 [oz_av]

## 2023-09-07 LAB — LACTIC ACID, PLASMA
Lactic Acid, Venous: 7.1 mmol/L (ref 0.5–1.9)
Lactic Acid, Venous: >9 mmol/L (ref 0.5–1.9)
Lactic Acid, Venous: >9 mmol/L (ref 0.5–1.9)
Lactic Acid, Venous: >9 mmol/L (ref 0.5–1.9)
Lactic Acid, Venous: >9 mmol/L (ref 0.5–1.9)

## 2023-09-07 LAB — PREPARE RBC (CROSSMATCH)

## 2023-09-07 LAB — FIBRINOGEN: Fibrinogen: 400 mg/dL (ref 210–475)

## 2023-09-07 LAB — ABO/RH: ABO/RH(D): A POS

## 2023-09-07 MED ORDER — POTASSIUM CHLORIDE 10 MEQ/100ML IV SOLN
10.0000 meq | INTRAVENOUS | Status: AC
  Administered 2023-09-07 (×4): 10 meq via INTRAVENOUS
  Filled 2023-09-07 (×4): qty 100

## 2023-09-07 MED ORDER — LACTATED RINGERS IV BOLUS
500.0000 mL | Freq: Once | INTRAVENOUS | Status: AC
  Administered 2023-09-07: 500 mL via INTRAVENOUS

## 2023-09-07 MED ORDER — SODIUM CHLORIDE 0.9% IV SOLUTION
Freq: Once | INTRAVENOUS | Status: AC
Start: 2023-09-07 — End: 2023-09-07

## 2023-09-07 MED ORDER — STERILE WATER FOR INJECTION IV SOLN
INTRAVENOUS | Status: DC
  Filled 2023-09-07: qty 150
  Filled 2023-09-07: qty 1000

## 2023-09-07 MED ORDER — LACTATED RINGERS IV BOLUS
1000.0000 mL | Freq: Once | INTRAVENOUS | Status: AC
  Administered 2023-09-07: 1000 mL via INTRAVENOUS

## 2023-09-07 MED ORDER — SODIUM CHLORIDE 0.9 % IV SOLN: 2.0000 g | Freq: Two times a day (BID) | INTRAVENOUS | Status: DC

## 2023-09-07 MED ORDER — DEXTROSE 5 % IV SOLN
100.0000 mg | Freq: Two times a day (BID) | INTRAVENOUS | Status: DC
  Administered 2023-09-07 – 2023-09-08 (×3): 100 mg via INTRAVENOUS
  Filled 2023-09-07 (×3): qty 100

## 2023-09-07 MED ORDER — PANTOPRAZOLE SODIUM 40 MG IV SOLR
40.0000 mg | Freq: Two times a day (BID) | INTRAVENOUS | Status: DC
  Administered 2023-09-07 – 2023-09-30 (×46): 40 mg via INTRAVENOUS
  Filled 2023-09-07 (×46): qty 10

## 2023-09-07 MED ORDER — SODIUM BICARBONATE 8.4 % IV SOLN
INTRAVENOUS | Status: DC
  Filled 2023-09-07: qty 150
  Filled 2023-09-07: qty 1000

## 2023-09-07 MED ORDER — NOREPINEPHRINE 4 MG/250ML-% IV SOLN
2.0000 ug/min | INTRAVENOUS | Status: DC
  Administered 2023-09-07: 2 ug/min via INTRAVENOUS
  Filled 2023-09-07: qty 250

## 2023-09-07 MED ORDER — ALBUMIN HUMAN 25 % IV SOLN
25.0000 g | Freq: Once | INTRAVENOUS | Status: AC
  Administered 2023-09-07: 12.5 g via INTRAVENOUS
  Filled 2023-09-07: qty 100

## 2023-09-07 MED ORDER — ALBUMIN HUMAN 25 % IV SOLN
25.0000 g | Freq: Once | INTRAVENOUS | Status: AC
  Administered 2023-09-07: 25 g via INTRAVENOUS
  Filled 2023-09-07: qty 100

## 2023-09-07 MED ORDER — SODIUM CHLORIDE 0.9 % IV BOLUS
500.0000 mL | Freq: Once | INTRAVENOUS | Status: AC
  Administered 2023-09-07: 500 mL via INTRAVENOUS

## 2023-09-07 MED ORDER — SODIUM CHLORIDE 0.9 % IV SOLN
20.0000 ug | Freq: Once | INTRAVENOUS | Status: AC
  Administered 2023-09-07: 20 ug via INTRAVENOUS
  Filled 2023-09-07: qty 5

## 2023-09-07 MED ORDER — PANTOPRAZOLE SODIUM 40 MG IV SOLR
40.0000 mg | INTRAVENOUS | Status: AC
  Administered 2023-09-07 (×2): 40 mg via INTRAVENOUS
  Filled 2023-09-07 (×2): qty 10

## 2023-09-07 MED ORDER — POTASSIUM CHLORIDE 10 MEQ/50ML IV SOLN: 10.0000 meq | INTRAVENOUS | Status: DC

## 2023-09-07 MED ORDER — SODIUM CHLORIDE 0.9 % IV SOLN: 250.0000 mL | INTRAVENOUS | Status: AC

## 2023-09-07 MED ORDER — SODIUM CHLORIDE 0.9 % IV SOLN
2.0000 g | Freq: Two times a day (BID) | INTRAVENOUS | Status: DC
  Administered 2023-09-07 (×2): 2 g via INTRAVENOUS
  Filled 2023-09-07 (×4): qty 12.5

## 2023-09-07 MED ORDER — DEXTROSE-SODIUM CHLORIDE 5-0.45 % IV SOLN: INTRAVENOUS | Status: DC

## 2023-09-07 NOTE — Progress Notes (Signed)
 BP continues to trend hypotensively. Provider notified. Albumin 25 g added. Labs are added

## 2023-09-07 NOTE — Progress Notes (Signed)
 Progress Note   Patient: Kaitlyn Hodges FMW:[id number] DOB: [birth year] DOA: 09/04/2023     3 DOS: the patient was seen and examined on 09/07/2023  Brief hospital course: Kaitlyn Hodges is a 63 y.o. female with medical history significant of prediabetes, CVA, seizure disorder, type I achalasia,tobacco use disorder, erosive esophagitis who presents to the ED due to altered mental status.      Patient's daughter noted patient had dyspnea and cough for a month prior to admission and for the last week she had N/V and poor oral intake.    Patient was afebrile, hypotensive and tachycardic on vitals. She had a mild acidosis with increased AG. Noted lactate of 7.7. WBC of 32.9. BUN/cr 114/4.61. COVID, flu, RSV negative. Pan scan notable for possible L parotiditis, central airway thickening and patchy airspace opacities in the bases, age indeterminate T3 fracture.    Patient was started on broad spectrum antibiotics and fluid resuscitated.        Assessment and Plan: Severe sepsis, resolving  Patient presenting with hypotension, tachycardia, marked leukocytosis and lactic acidosis with suspicion for underlying infection, although of unknown source.     The possible source of her infection include  acute on chronic aspiration pneumonia (given severe achalasia and shortness of breath/cough for 1 month), versus viral source (given left parotiditis and reported nausea/vomiting for 1 week).   Resp viral panel negative (flu/RSV/COVID), L parotid US  w/o abscess, procal elevated.BC NG<24h   Continue antibiotics Follow-up on culture results  Hemorrhagic shock secondary to acute GI bleed Gastroenterologist on board We will monitor CBC closely Patient's received from resuscitation and now requiring vasopressor support as Intensivist following closely   AKI (acute kidney injury) (HCC) 2/2 hypovolemia and severe sepsis. In the setting of poor p.o. intake, severe uncontrolled achalasia Continue hydration Monitor  renal function closely   Acute encephalopathy Improving  Encephalopathy improved Continue aspiration precaution   Achalasia Patient follows up with GI outpatient   Pre-diabetes Notable hyperglycemia seen on admission, potentially stress response given history of prediabetes only with last A1c of 5.8%. Follow-up on A1c Continue monitoring glucose level   Closed wedge compression fracture of T3 vertebra (HCC) Incidental finding of a T3 compression fracture on imaging.  Patient's daughter reports a fall 2 weeks ago but states her mom did not complain of any pain afterwards.  Given mild nature (less than 10%) and age-indeterminate, will hold off on neurosurgery consultation. Continue pain management   History of CVA (cerebrovascular accident) Per chart review, patient has a history of CVA dating back to 2010.  CT of the head obtained showed chronic infarcts scattered along the left frontoparietal convexity in the left basal ganglia, as well as lacunar infarcts in the right thalamus.  No acute findings.  Does not appear patient is on statin or aspirin.   Seizure disorder [facility]) Per chart review, there is a reported history of seizure disorder that began after CVA in 2010.  No reported seizures since that time. Continue seizure precaution       Subjective:  Overnight patient became hypotensive This morning found to have profuse melena while in the ICU Gastroenterologist have been consulted As well as intensivist Unable to obtain subjective information o/a of Mental status    Physical Exam:  Constitutional: In no distress.  HEENT: L parotid gland TTP, overlying skin mildly erythematous Cardiovascular: Normal rate, regular rhythm. No lower extremity edema  Pulmonary: Non labored breathing on room air, no wheezing or rales.  Abdominal: Soft. Normal bowel sounds.  Non distended, diffusely tender to minimal palpation  Musculoskeletal: Normal range of motion.     Neurological: Alert and  oriented to person, place, but not time. Non focal  Skin: Skin is warm and dry. +Hirsutisum      Data Reviewed:    Latest Ref Rng & Units 09/07/2023    4:40 AM 09/06/2023    6:40 AM 09/05/2023    4:51 AM  BMP  Glucose 70 - 99 mg/dL 869  86  99   BUN 8 - 23 mg/dL 76  78  97   Creatinine 0.44 - 1.00 mg/dL 8.17  8.18  6.98   Sodium 135 - 145 mmol/L 152  146  143   Potassium 3.5 - 5.1 mmol/L 2.9  3.9  3.3   Chloride 98 - 111 mmol/L 117  113  107   CO2 22 - 32 mmol/L 14  22  19    Calcium 8.9 - 10.3 mg/dL 8.0  8.4  8.0        Latest Ref Rng & Units 09/07/2023   12:30 PM 09/07/2023    4:40 AM 09/06/2023    6:40 AM  CBC  WBC 4.0 - 10.5 K/uL 19.5  27.7  23.5   Hemoglobin 12.0 - 15.0 g/dL 7.6  88.7  86.4   Hematocrit 36.0 - 46.0 % 24.4  33.4  41.3   Platelets 150 - 400 K/uL 62  81  87     Vitals:   09/07/23 1609 09/07/23 1615 09/07/23 1635 09/07/23 1726  BP: 102/60  (!) 65/54 96/72  Pulse: 100 (!) 102 (!) 107 (!) 108  Resp: (!) 24 (!) 23 (!) 24 (!) 21  Temp: (!) 96.2 F (35.7 C)   (!) 96.8 F (36 C)  TempSrc: Axillary   Axillary  SpO2:  96% 94% 94%  Weight:      Height:         Author: Drue ONEIDA Potter, MD 09/07/2023 5:39 PM  For on call review [email or web address].

## 2023-09-07 NOTE — Progress Notes (Signed)
 Level of care has been upgraded by provider. Ongoing routine vitals and assessment remain in progress. Pt remains alert and does not endorse any pain or discomfort.

## 2023-09-07 NOTE — Progress Notes (Signed)
*  PRELIMINARY RESULTS* Echocardiogram 2D Echocardiogram has been performed.  Kaitlyn Hodges 09/07/2023, 2:31 PM

## 2023-09-07 NOTE — Plan of Care (Signed)

## 2023-09-07 NOTE — Consult Note (Signed)
 NAME:  Kaitlyn Hodges, MRN:  [id number], DOB:  1961-04-28, LOS: 3 ADMISSION DATE:  09/04/2023, CONSULTATION DATE:  09/07/2023 REFERRING MD:  Drue Potter, MD, CHIEF COMPLAINT:  Hypotension   History of Present Illness:   Patient is a pleasant 63 year old female presenting on 1/4 to the ED with weakness, altered mental status, and hypotension.  Patient admitted on 1/4 for the above chief complaint. Initial workup showed lactic acidosis with level up to 7.7, and notable leukocytosis with WBC up to 32.9 on presentation. Acute kidney injury also noted on initial CMP. Blood cultures were drawn and she received IV fluids and IV antibiotics with improvement. She was admitted to [facility] for further management.  Patient was admitted to the medical floor where she was noted to be hypotensive this AM, prompting transfer to the ICU. In the ICU, blood pressure was consistently low, and she was also noted to have a large amount of tarry liquid stools. Patient is tachypneic and continues to have altered mental status. ICU team consulted to help with management.  Pertinent  Medical History   -EGD 2021 with moderately severe erosive esophagitis and achalasia -Seizure Disorder -T2DM -History of CVA  Significant Hospital Events: Including procedures, antibiotic start and stop dates in addition to other pertinent events   1/4: admit to hospital 1/7: admit to ICU, maroon colored bowel movement  Interim History / Subjective:  Patient is disoriented, but answers questions appropriately. Reports shortness of breath.  Objective   Blood pressure (!) 79/51, pulse (!) 109, temperature 98 F (36.7 C), temperature source Axillary, resp. rate (!) 28, height 5' 6 (1.676 m), weight 72.6 kg, last menstrual period 02/10/2001, SpO2 96%.        Intake/Output Summary (Last 24 hours) at 09/07/2023 1039 Last data filed at 09/07/2023 0600 Gross per 24 hour  Intake 750.1 ml  Output 700 ml  Net 50.1 ml   Filed Weights   09/04/23  0919  Weight: 72.6 kg    Examination: Physical Exam Constitutional:      General: She is in acute distress.     Appearance: She is ill-appearing.  Cardiovascular:     Pulses: Normal pulses.  Pulmonary:     Effort: Pulmonary effort is normal.     Breath sounds: Normal breath sounds.  Abdominal:     Palpations: Abdomen is soft.  Musculoskeletal:     Right lower leg: No edema.     Left lower leg: No edema.  Neurological:     Mental Status: She is alert. She is disoriented.      Assessment & Plan:   Neurology #Toxic Metabolic Encephalopathy #History of CVA #Seizure Disorder  Presents with altered mental status to the hospital, and remains with AMS on admission to the ICU. Suspect toxic metabolic encephalopathy is secondary to her shock and decreased perfusion. CT head showed remote/old left MCA stroke, so recrudescence of previous symptoms are possible, as is a subclinical seizure. Once stable from her shock will proceed with EEG and MRI of the brain.  Cardiovascular #Hemorrhagic Shock  Initially presented with weakness and hypotension to the hospital, now with hemorrhagic shock following large melenotic stool, potentially secondary to upper GI bleed. No reported cardiac history. 4 peripheral IV's in place, and the patient was started on IV fluids, blood products, and IV vasopressor support.  -nor-epinephrine with goal MAP > 65 mmHg -LR 1 liter bolus -transfuse blood products -TTE  Pulmonary #Shortness of Breath  Increased shortness of breath in the setting of  shock with compensation for lactic acidosis. pH was 7.3 on peripheral VBG which is re-assuring. CT scan of the chest personally reviewed and doesn't show any focal infiltrates.  -goal SpO2 > 92%  Gastrointestinal #GI Bleed #History of Achalasia  Has a history of achalasia and erosive esophagitis, but no history of prior GI bleed or cirrhosis. Developed melanotic stools with hemodynamic instability concerning  for a brisk upper GI bleed. Initiated on high dose PPI, multiple peripheral IV's placed, and initiated on volume resuscitation. Will consider embolization if patient remains hemodynamically unstable. Appreciate input from GI.  -CBC q6hours -maintain two large bore IV's -high dose PPI -hold off on octreotide for now -antibiotics per ID section  Renal #AKI  Presents with AKI, improved following volume resuscitation on presentation. Most recent creatinine down to 1.8 with a BUN of 76. Attempting to avoid nephrotoxins as able.  -continue to monitor kidney function and electrolytes  Endocrine  ICU glycemic protocol.  Hem/Onc #GI Bleed  Volume resuscitation with IV fluids, PRBC, FFP, and platelets. Will attempt a 4:2:1 ratio for now. Discontinued enoxaparin .  -CBC q6hours -D/C DVT prophylaxis  ID  On broad spectrum antibiotics for prophylaxis given upper GI bleed. No signs of aspiration pneumonia on my review of the chest CT. Antibiotics were started to cover sepsis empirically on presentation.  -continue IV antibiotics for now -consider narrowing scope of antibiotics to Ceftriaxone   MSK #T3 Compression fracture  Pain control with tylenol  and oxycodone    Best Practice (right click and Reselect all SmartList Selections daily)   Diet/type: NPO DVT prophylaxis not indicated Pressure ulcer(s): N/A GI prophylaxis: PPI Lines: N/A Foley:  Yes, and it is still needed Code Status:  full code Last date of multidisciplinary goals of care discussion [09/07/2023]  Labs   CBC: Recent Labs  Lab 09/04/23 1041 09/05/23 0451 09/06/23 0640 09/07/23 0440  WBC 32.9* 23.9* 23.5* 27.7*  NEUTROABS 29.9* 21.7* 21.9* 26.6*  HGB 17.8* 14.4 13.5 11.2*  HCT 52.0* 42.3 41.3 33.4*  MCV 95.1 94.6 96.9 98.2  PLT 193 133* 87* 81*    Basic Metabolic Panel: Recent Labs  Lab 09/04/23 1041 09/05/23 0451 09/06/23 0640 09/07/23 0440  NA 144 143 146* 152*  K 3.8 3.3* 3.9 2.9*  CL 100 107  113* 117*  CO2 18* 19* 22 14*  GLUCOSE 216* 99 86 130*  BUN 114* 97* 78* 76*  CREATININE 4.61* 3.01* 1.81* 1.82*  CALCIUM 8.6* 8.0* 8.4* 8.0*  MG  --   --  2.4  --    GFR: Estimated Creatinine Clearance: 32.3 mL/min (A) (by C-G formula based on SCr of 1.82 mg/dL (H)). Recent Labs  Lab 09/04/23 1017 09/04/23 1041 09/04/23 1241 09/04/23 1736 09/05/23 0451 09/06/23 0640 09/07/23 0440  PROCALCITON 1.84  --   --   --   --   --   --   WBC  --  32.9*  --   --  23.9* 23.5* 27.7*  LATICACIDVEN  --  7.7* 5.0* 3.4*  --   --  7.1*    Liver Function Tests: Recent Labs  Lab 09/04/23 1041 09/05/23 0451 09/07/23 0440  AST 33 24 35  ALT 23 18 24   ALKPHOS 77 49 75  BILITOT 1.0 1.1 1.0  PROT 7.0 5.4* 4.5*  ALBUMIN  3.2* 2.5* 1.9*   No results for input(s): LIPASE, AMYLASE in the last 168 hours. No results for input(s): AMMONIA in the last 168 hours.  ABG No results found for: PHART, PCO2ART, PO2ART, HCO3,  TCO2, ACIDBASEDEF, O2SAT   Coagulation Profile: Recent Labs  Lab 09/04/23 1041  INR 1.2    Cardiac Enzymes: Recent Labs  Lab 09/04/23 1041  CKTOTAL 60    HbA1C: Hgb A1c MFr Bld  Date/Time Value Ref Range Status  09/04/2023 10:41 AM 6.0 (H) 4.8 - 5.6 % Final    Comment:    (NOTE)         Prediabetes: 5.7 - 6.4         Diabetes: >6.4         Glycemic control for adults with diabetes: <7.0   04/19/2012 12:00 AM 5.8 4.0 - 6.0 % Final    CBG: Recent Labs  Lab 09/06/23 1722 09/06/23 1737 09/06/23 2027 09/07/23 0402 09/07/23 0821  GLUCAP 30* 133* 98 136* 128*    Review of Systems:   N/A  Past Medical History:  She,  has a past medical history of Kidney stones and Stroke (HCC).   Surgical History:   Past Surgical History:  Procedure Laterality Date   ABDOMINAL HYSTERECTOMY  2003   Total; Polycystic ovarian Disease   BIOPSY  01/17/2020   Procedure: BIOPSY;  Surgeon: Shila Gustav GAILS, MD;  Location: [facility] ENDOSCOPY;  Service:  Endoscopy;;   ESOPHAGOGASTRODUODENOSCOPY (EGD) WITH PROPOFOL  N/A 12/14/2019   Procedure: ESOPHAGOGASTRODUODENOSCOPY (EGD) WITH PROPOFOL ;  Surgeon: Therisa Bi, MD;  Location[facility] ENDOSCOPY;  Service: Gastroenterology;  Laterality: N/A;   ESOPHAGOGASTRODUODENOSCOPY (EGD) WITH PROPOFOL  N/A 01/17/2020   Procedure: ESOPHAGOGASTRODUODENOSCOPY (EGD) WITH PROPOFOL ;  Surgeon: Shila Gustav GAILS, MD;  Location: [facility] ENDOSCOPY;  Service: Endoscopy;  Laterality: N/A;  Gastric Lavage   FOREIGN BODY REMOVAL  01/17/2020   Procedure: FOREIGN BODY REMOVAL;  Surgeon: Shila Gustav GAILS, MD;  Location: [facility] ENDOSCOPY;  Service: Endoscopy;;  Food impaction in Esophagus     Social History:   reports that she has been smoking cigarettes. She has never used smokeless tobacco. She reports current alcohol use. She reports that she does not use drugs.   Family History:  Her family history includes Alcohol abuse in her father and maternal grandfather; Bone cancer in her father; Breast cancer in her mother; Heart attack in her paternal grandfather; Hypertension in her father and paternal grandmother; Ovarian cancer in her maternal grandmother; Raynaud syndrome in her mother; Skin cancer in her paternal grandmother and sister; Stroke in her mother and paternal grandfather.   Allergies No Known Allergies   Home Medications  Prior to Admission medications   Medication Sig Start Date End Date Taking? Authorizing Provider  amoxicillin -clavulanate (AUGMENTIN ) 875-125 MG tablet Take 1 tablet by mouth 2 (two) times daily. Patient not taking: Reported on 09/04/2023 03/03/21   Viviann Pastor, MD  benzonatate  (TESSALON ) 100 MG capsule Take 1 capsule (100 mg total) by mouth 2 (two) times daily as needed for cough. Patient not taking: Reported on 03/21/2021 01/21/21   Nedra Tinnie LABOR, NP  naproxen  (NAPROSYN ) 500 MG tablet Take 1 tablet (500 mg total) by mouth 2 (two) times daily with a meal. Patient not taking: Reported on 09/04/2023  03/03/21   Viviann Pastor, MD  predniSONE  (DELTASONE ) 10 MG tablet Take 6 tablets today, then 5 tablets tomorrow, then decrease by 1 tablet every day until gone Patient not taking: Reported on 09/04/2023 01/21/21   Nedra Tinnie LABOR, NP     Critical care time: 67 minutes    Belva November, MD [facility] 09/07/2023 2:55 PM

## 2023-09-07 NOTE — Progress Notes (Signed)
An USGPIV (ultrasound guided PIV) 20G x 1.88" on RFA has been placed for short-term vasopressor infusion. A correctly placed ivWatch must be used when administering Vasopressors. Should this treatment be needed beyond 24 hours, central line access should be obtained.  It will be the responsibility of the bedside nurse to follow best practice to prevent extravasations.

## 2023-09-07 NOTE — Progress Notes (Signed)
 Pt's midnight routine vitals were abnormal (hypotensive and tachycardic) and categorized as a red mews. Protocol is followed. Provider is notified of Red Mews. New orders are rendered.

## 2023-09-07 NOTE — Progress Notes (Signed)
 Pharmacy Antibiotic Note  Kaitlyn Hodges is a 63 y.o. female admitted on 09/04/2023 with bacteremia.  Pharmacy has been consulted for Cefepime  dosing.  Plan: Cefepime  2 gm q12hr per indication & renal fxn.  Pharmacy will continue to follow and will adjust abx dosing whenever warranted.  Temp (24hrs), Avg:98.5 F (36.9 C), Min:98.1 F (36.7 C), Max:99.3 F (37.4 C)   Recent Labs  Lab 09/04/23 1041 09/04/23 1241 09/04/23 1736 09/05/23 0451 09/06/23 0640 09/06/23 0833 09/07/23 0440  WBC 32.9*  --   --  23.9* 23.5*  --   --   CREATININE 4.61*  --   --  3.01* 1.81*  --   --   LATICACIDVEN 7.7* 5.0* 3.4*  --   --   --  7.1*  VANCORANDOM  --   --   --   --   --  14  --     Estimated Creatinine Clearance: 32.4 mL/min (A) (by C-G formula based on SCr of 1.81 mg/dL (H)).    No Known Allergies  Antimicrobials this admission: 1/06 Ceftriaxone  >> x 1 dose 1/06 Doxycycline  >>  1/06 Flagyl  >>  1/07 Cefepime  >>  Microbiology results: 1/04 BCx: NG x 2 days 1/04 UCx: Abnormal / Insignificant growth   Thank you for allowing pharmacy to be a part of this patient's care.  Rankin CANDIE Dills, PharmD, [facility] 09/07/2023 5:37 AM

## 2023-09-07 NOTE — Progress Notes (Signed)
 OT Cancellation Note  Patient Details Name: Kaitlyn Hodges MRN: [id number] DOB: 07-07-1961   Cancelled Treatment:    Reason Eval/Treat Not Completed: Other (comment) (orders received, chart reviewed. Per nurse, pt is transferring to ICU. As pt requires and increased level of care, OT will sign off and need new orders. Please re consult when pt is medically ready.)  Therisa Sheffield, OTD OTR/L  09/07/23, 8:55 AM

## 2023-09-07 NOTE — Progress Notes (Signed)
 SLP Cancellation Note  Patient Details Name: Tiaunna Buford MRN: [id number] DOB: 07-25-1961   Cancelled treatment:       Reason Eval/Treat Not Completed: Medical issues which prohibited therapy  Pt inappropriate for trials of PO at this time. Pt is awake but with RR of 28 and O2 of 80%, nursing aware and confirms that pt has not been alert with plan to transfer to ICU.   ST to monitor for po readiness.   Lakshya Mcgillicuddy 09/07/2023, 8:52 AM

## 2023-09-07 NOTE — Progress Notes (Signed)
 1/7 Patient moved to the ICU and unable to sign. I spoke to patient's son Vinie via telephone @[phone] and at his request daughter/sister Dayle Milroy @[phone] to explain the IMM Letter. I received verbal consent and acknowledgement of of IMM Letter.Copy will be mailed to the address on file,

## 2023-09-07 NOTE — Progress Notes (Signed)
       CROSS COVER NOTE  NAME: Kaitlyn Hodges MRN: [id number] DOB : December 04, 1960    Date of Service   09/07/2023   HPI/Events of Note   Nurse paged because patient was noticed to be hypotensive prompting and read and MEWS alert.  Chart review showed that patient has been treated for septicemia secondary to pneumonia.  During bedside assessment patient appears lethargic.  Patient was febrile and recent lab results shows that her leukocytosis is improving but still high at 23.5.  Patient initially responsive to a liter of fluids, but blood pressure deteriorated after fluid infusion.  Interventions    CBC lactic acid and CMP sent for analysis to ascertain if patient's infection is getting worse.  Repeat chest x-ray ordered Patient's albumin  on previous labs was 2.5, 25 g of albumin  ordered to be infused. Repeat albumin  was 1.7, another 25g ordered. Lactic acid resulted at 7.1 confirming suspicion for worsening bacteremia.  Patient given another bolus of lactated Ringer 's. Patient ceftriaxone  DC'd and cefepime  ordered to broaden antibiotic coverage for Pseudomonas.  Patient was MRSA negative so no Vanco was added. Patient's level of care will be upgraded to SD for now but if BP does not improve patient might be transferred to the ICU.  Critical care NP already made aware. Patient made n.p.o. in case she aspirated causing worsening of her condition.  Will need SLP consult.       Kaitlyn Frith Lamin Danene Montijo, MSN, APRN, AGACNP-BC [facility] [place] Pager: [phone]. Check Amion for Availability

## 2023-09-07 NOTE — Progress Notes (Signed)
 Blood pressure begins to trend back down. Provider notified. New orders rendered to bolus.See MAR for details

## 2023-09-07 NOTE — Consult Note (Signed)
 Kaitlyn Copping, MD [facility]  [address] [place], [place] [address] Phone: [phone] Fax : [phone]  Consultation  Referring Provider:     Durand Hodges Primary Care Physician:  Kaitlyn Nancyann BRAVO, MD Primary Gastroenterologist:  Dr. Therisa         Reason for Consultation:     Melena  Date of Admission:  09/04/2023 Date of Consultation:  09/07/2023         HPI:   Kaitlyn Hodges is a 63 y.o. female who was admitted earlier this month for change in mental status and a fall.  The patient has a history of diabetes, COPD, achalasia, seizures and was diagnosed with sepsis.  The patient has had upper endoscopies in the past with food boluses removed and a torturous esophagus.  The patient has had erosive esophagitis found with dilation of the esophagus and type I achalasia.  On admission the patient's daughter stated that the patient had been having a month of significant shortness of breath with a cough and progressive worsening. It was reported that the patient had been improving from her hypotension with tachycardia and acidosis. I was notified this morning that the patient was having persistent hypotension and was being moved to the ICU and had a massive black tarry stool. The patient had a chest x-ray sent over earlier this morning that showed:  IMPRESSION: 1. Increased patchy airspace infiltrate in the right upper and lower lung fields consistent with pneumonia or aspiration. 2. Hypoinflation. 3. Chronic dilatation and tortuosity of the esophagus. 4. Aortic atherosclerosis.  The patient's hemoglobin 3 days ago was 17.8 which was down to 14.42 days ago and 13.5 yesterday.  At 4:40 AM the hemoglobin had decreased to 11.2.  The patient had a consistently elevated white cell count at 32.93 days ago and down to 23.5 yesterday but up to 27.7 today. Patient is not able to give any history and is lying in his bed in no apparent distress.  She remains hypotensive.  Past Medical History:   Diagnosis Date   Kidney stones    Stroke [facility])     Past Surgical History:  Procedure Laterality Date   ABDOMINAL HYSTERECTOMY  2003   Total; Polycystic ovarian Disease   BIOPSY  01/17/2020   Procedure: BIOPSY;  Surgeon: Kaitlyn Gustav GAILS, MD;  Location: [facility];  Service: Endoscopy;;   ESOPHAGOGASTRODUODENOSCOPY (EGD) WITH PROPOFOL  N/A 12/14/2019   Procedure: ESOPHAGOGASTRODUODENOSCOPY (EGD) WITH PROPOFOL ;  Surgeon: Kaitlyn Bi, MD;  Location: [facility] ENDOSCOPY;  Service: Gastroenterology;  Laterality: N/A;   ESOPHAGOGASTRODUODENOSCOPY (EGD) WITH PROPOFOL  N/A 01/17/2020   Procedure: ESOPHAGOGASTRODUODENOSCOPY (EGD) WITH PROPOFOL ;  Surgeon: Kaitlyn Gustav GAILS, MD;  Location: [facility];  Service: Endoscopy;  Laterality: N/A;  Gastric Lavage   FOREIGN BODY REMOVAL  01/17/2020   Procedure: FOREIGN BODY REMOVAL;  Surgeon: Kaitlyn Gustav GAILS, MD;  Location: [facility];  Service: Endoscopy;;  Food impaction in Esophagus    Prior to Admission medications   Medication Sig Start Date End Date Taking? Authorizing Provider  amoxicillin -clavulanate (AUGMENTIN ) 875-125 MG tablet Take 1 tablet by mouth 2 (two) times daily. Patient not taking: Reported on 09/04/2023 03/03/21   Kaitlyn Pastor, MD  benzonatate  (TESSALON ) 100 MG capsule Take 1 capsule (100 mg total) by mouth 2 (two) times daily as needed for cough. Patient not taking: Reported on 03/21/2021 01/21/21   Kaitlyn Tinnie LABOR, NP  naproxen  (NAPROSYN ) 500 MG tablet Take 1 tablet (500 mg total) by mouth 2 (two) times  daily with a meal. Patient not taking: Reported on 09/04/2023 03/03/21   Kaitlyn Pastor, MD  predniSONE  (DELTASONE ) 10 MG tablet Take 6 tablets today, then 5 tablets tomorrow, then decrease by 1 tablet every day until gone Patient not taking: Reported on 09/04/2023 01/21/21   Kaitlyn Tinnie LABOR, NP    Family History  Problem Relation Age of Onset   Breast cancer Mother    Stroke Mother    Raynaud syndrome Mother    Hypertension  Father    Bone cancer Father    Alcohol abuse Father    Skin cancer Sister    Ovarian cancer Maternal Grandmother    Alcohol abuse Maternal Grandfather    Skin cancer Paternal Grandmother    Hypertension Paternal Grandmother    Heart attack Paternal Grandfather    Stroke Paternal Grandfather      Social History   Tobacco Use   Smoking status: Every Day    Types: Cigarettes   Smokeless tobacco: Never   Tobacco comments:    pt smokes 1 pack of cigarettes very 2 to 3 days  Vaping Use   Vaping status: Never Used  Substance Use Topics   Alcohol use: Yes    Alcohol/week: 0.0 standard drinks of alcohol    Comment: occassional    Drug use: No    Allergies as of 09/04/2023   (No Known Allergies)    Review of Systems:    All systems reviewed and negative except where noted in HPI.   Physical Exam:  Vital signs in last 24 hours: Temp:  [98 F (36.7 C)-99.3 F (37.4 C)] 98 F (36.7 C) (01/07 0630) Pulse Rate:  [87-129] 109 (01/07 0830) Resp:  [17-30] 28 (01/07 0945) BP: (66-137)/(41-98) 79/51 (01/07 0945) SpO2:  [92 %-100 %] 96 % (01/07 0830) Last BM Date : 09/07/23 General:   Lethargic Head:  Normocephalic and atraumatic. Eyes:   No icterus.   Conjunctiva pink. PERRLA. Ears:  Normal auditory acuity. Neck:  Supple; no masses or thyroidomegaly Lungs: Respirations even and unlabored. Lungs clear to auscultation bilaterally.   No wheezes, crackles, or rhonchi.  Heart:  Regular rate and rhythm;  Without murmur, clicks, rubs or gallops Abdomen:  Soft, nondistended, mild tenderness. Normal bowel sounds. No appreciable masses or hepatomegaly.  No rebound or guarding.  Rectal:  Not performed.  But there is melena on the patient's sheets and in her bed. Msk:  Symmetrical without gross deformities.    Extremities:  Without edema, cyanosis or clubbing. Neurologic: Somnolent;  grossly normal neurologically. Skin:  Intact without significant lesions or rashes. Cervical Nodes:  No  significant cervical adenopathy. Psych:  Alert and cooperative. Normal affect.  LAB RESULTS: Recent Labs    09/05/23 0451 09/06/23 0640 09/07/23 0440  WBC 23.9* 23.5* 27.7*  HGB 14.4 13.5 11.2*  HCT 42.3 41.3 33.4*  PLT 133* 87* 81*   BMET Recent Labs    09/05/23 0451 09/06/23 0640 09/07/23 0440  NA 143 146* 152*  K 3.3* 3.9 2.9*  CL 107 113* 117*  CO2 19* 22 14*  GLUCOSE 99 86 130*  BUN 97* 78* 76*  CREATININE 3.01* 1.81* 1.82*  CALCIUM 8.0* 8.4* 8.0*   LFT Recent Labs    09/07/23 0440  PROT 4.5*  ALBUMIN  1.9*  AST 35  ALT 24  ALKPHOS 75  BILITOT 1.0   PT/INR Recent Labs    09/04/23 1041  LABPROT 15.6*  INR 1.2    STUDIES: DG Chest [facility] 1 [address]  Result Date: 09/07/2023 CLINICAL DATA:  [id number] with shortness of breath. EXAM: PORTABLE CHEST 1 VIEW COMPARISON:  Chest CT without contrast 09/04/2023 FINDINGS: 5:27 a.m. There is increased patchy airspace infiltrate in the right upper and lower lung fields consistent with pneumonia or aspiration. The lungs are hypoinflated and otherwise clear. The mediastinum is unchanged. Chronic dilatation and tortuosity of the esophagus again exaggerates the right paratracheal soft tissue stripe. There is mild aortic atherosclerosis.  The cardiac size is normal. No pleural effusion is seen.  No new osseous findings. IMPRESSION: 1. Increased patchy airspace infiltrate in the right upper and lower lung fields consistent with pneumonia or aspiration. 2. Hypoinflation. 3. Chronic dilatation and tortuosity of the esophagus. 4. Aortic atherosclerosis. Electronically Signed   By: Francis Quam M.D.   On: 09/07/2023 06:12      Impression / Plan:   Assessment: Principal Problem:   Severe sepsis (HCC) Active Problems:   Seizure disorder (HCC)   Achalasia   Pre-diabetes   AKI (acute kidney injury) (HCC)   Acute encephalopathy   History of CVA (cerebrovascular accident)   Closed wedge compression fracture of T3 vertebra (HCC)   Kaitlyn  Hodges is a 63 y.o. y/o female with hypertension and melena with a history of a chest x-ray showing increased patchy airspace infiltrates in right upper and lower lung fields consistent with pneumonia or aspiration.  The patient likely has an upper GI bleed which would explain the melena and hypotension.  The patient's repeat blood count from this morning which was 11.7 has gone down to 7.6.  Plan:  This patient likely has an upper GI bleed and pneumonia with possible aspiration.  Patient is also hypertensive.  The patient is not in any condition at this time to undergo any endoscopic procedures.  If the patient continues to have melena and needs continuous fluid resuscitation and blood angiography with embolization should be considered.  Surgical intervention can also be considered if an NG tube is placed and there is signs of blood within the upper GI tract.  If the patient becomes more stable a CT angiography can be considered to ascertain where the bleeding site may be.  I will follow along with you during this patient's hospital stay.  Thank you for involving me in the care of this patient.      LOS: 3 days   Kaitlyn Copping, MD, [facility] 09/07/2023, 10:21 AM,  Pager [phone] 7am-5pm  Check AMION for 5pm -7am coverage and on weekends   Note: This dictation was prepared with Dragon dictation along with smaller phrase technology. Any transcriptional errors that result from this process are unintentional.

## 2023-09-07 NOTE — Progress Notes (Addendum)
 Attempted to call ICU report. Unable to take report at this time. The nurse will call when ready  Report given to ICU nurse. Patient and belongings transferred to room 1

## 2023-09-08 ENCOUNTER — Inpatient Hospital Stay: Payer: Medicaid Other | Admitting: Certified Registered"

## 2023-09-08 ENCOUNTER — Encounter
Admission: EM | Disposition: A | Discharge status: Skilled Nursing Facility | Payer: Self-pay | Source: Home / Self Care | Attending: Internal Medicine

## 2023-09-08 ENCOUNTER — Encounter: Payer: Self-pay | Admitting: Internal Medicine

## 2023-09-08 ENCOUNTER — Inpatient Hospital Stay: Payer: Medicaid Other

## 2023-09-08 DIAGNOSIS — K922 Gastrointestinal hemorrhage, unspecified: Secondary | ICD-10-CM | POA: Diagnosis not present

## 2023-09-08 DIAGNOSIS — K3189 Other diseases of stomach and duodenum: Secondary | ICD-10-CM

## 2023-09-08 DIAGNOSIS — R578 Other shock: Secondary | ICD-10-CM | POA: Diagnosis not present

## 2023-09-08 DIAGNOSIS — K920 Hematemesis: Secondary | ICD-10-CM

## 2023-09-08 DIAGNOSIS — G9340 Encephalopathy, unspecified: Secondary | ICD-10-CM | POA: Diagnosis not present

## 2023-09-08 DIAGNOSIS — N179 Acute kidney failure, unspecified: Secondary | ICD-10-CM | POA: Diagnosis not present

## 2023-09-08 DIAGNOSIS — R6520 Severe sepsis without septic shock: Secondary | ICD-10-CM | POA: Diagnosis not present

## 2023-09-08 DIAGNOSIS — K209 Esophagitis, unspecified without bleeding: Secondary | ICD-10-CM

## 2023-09-08 DIAGNOSIS — K2289 Other specified disease of esophagus: Secondary | ICD-10-CM | POA: Diagnosis not present

## 2023-09-08 DIAGNOSIS — A419 Sepsis, unspecified organism: Secondary | ICD-10-CM | POA: Diagnosis not present

## 2023-09-08 DIAGNOSIS — Q399 Congenital malformation of esophagus, unspecified: Secondary | ICD-10-CM

## 2023-09-08 HISTORY — PX: ESOPHAGOGASTRODUODENOSCOPY: SHX5428

## 2023-09-08 LAB — CBC WITH DIFFERENTIAL/PLATELET
Abs Immature Granulocytes: 0.21 10*3/uL — ABNORMAL HIGH (ref 0.00–0.07)
Abs Immature Granulocytes: 0.21 10*3/uL — ABNORMAL HIGH (ref 0.00–0.07)
Abs Immature Granulocytes: 0.27 10*3/uL — ABNORMAL HIGH (ref 0.00–0.07)
Abs Immature Granulocytes: 0.21 10*3/uL — ABNORMAL HIGH (ref 0.00–0.07)
Basophils Absolute: 0 10*3/uL (ref 0.0–0.1)
Basophils Absolute: 0.1 10*3/uL (ref 0.0–0.1)
Basophils Absolute: 0.1 10*3/uL (ref 0.0–0.1)
Basophils Absolute: 0.1 10*3/uL (ref 0.0–0.1)
Basophils Relative: 0 %
Basophils Relative: 0 %
Basophils Relative: 0 %
Basophils Relative: 0 %
Eosinophils Absolute: 0.3 10*3/uL (ref 0.0–0.5)
Eosinophils Absolute: 0.2 10*3/uL (ref 0.0–0.5)
Eosinophils Absolute: 0 10*3/uL (ref 0.0–0.5)
Eosinophils Absolute: 0 10*3/uL (ref 0.0–0.5)
Eosinophils Relative: 2 %
Eosinophils Relative: 1 %
Eosinophils Relative: 0 %
Eosinophils Relative: 0 %
HCT: 33.5 % — ABNORMAL LOW (ref 36.0–46.0)
HCT: 34.5 % — ABNORMAL LOW (ref 36.0–46.0)
HCT: 38 % (ref 36.0–46.0)
HCT: 35.2 % — ABNORMAL LOW (ref 36.0–46.0)
Hemoglobin: 11.3 g/dL — ABNORMAL LOW (ref 12.0–15.0)
Hemoglobin: 11.9 g/dL — ABNORMAL LOW (ref 12.0–15.0)
Hemoglobin: 12.6 g/dL (ref 12.0–15.0)
Hemoglobin: 12 g/dL (ref 12.0–15.0)
Immature Granulocytes: 1 %
Immature Granulocytes: 1 %
Immature Granulocytes: 1 %
Immature Granulocytes: 1 %
Lymphocytes Relative: 5 %
Lymphocytes Relative: 5 %
Lymphocytes Relative: 3 %
Lymphocytes Relative: 2 %
Lymphs Abs: 0.9 10*3/uL (ref 0.7–4.0)
Lymphs Abs: 0.9 10*3/uL (ref 0.7–4.0)
Lymphs Abs: 0.6 10*3/uL — ABNORMAL LOW (ref 0.7–4.0)
Lymphs Abs: 0.4 10*3/uL — ABNORMAL LOW (ref 0.7–4.0)
MCH: 31.6 pg (ref 26.0–34.0)
MCH: 31.6 pg (ref 26.0–34.0)
MCH: 31.7 pg (ref 26.0–34.0)
MCH: 31.8 pg (ref 26.0–34.0)
MCHC: 33.7 g/dL (ref 30.0–36.0)
MCHC: 34.5 g/dL (ref 30.0–36.0)
MCHC: 33.2 g/dL (ref 30.0–36.0)
MCHC: 34.1 g/dL (ref 30.0–36.0)
MCV: 93.6 fL (ref 80.0–100.0)
MCV: 91.8 fL (ref 80.0–100.0)
MCV: 95.5 fL (ref 80.0–100.0)
MCV: 93.4 fL (ref 80.0–100.0)
Monocytes Absolute: 0.2 10*3/uL (ref 0.1–1.0)
Monocytes Absolute: 0.1 10*3/uL (ref 0.1–1.0)
Monocytes Absolute: 0.1 10*3/uL (ref 0.1–1.0)
Monocytes Absolute: 0.1 10*3/uL (ref 0.1–1.0)
Monocytes Relative: 1 %
Monocytes Relative: 1 %
Monocytes Relative: 1 %
Monocytes Relative: 1 %
Neutro Abs: 17.8 10*3/uL — ABNORMAL HIGH (ref 1.7–7.7)
Neutro Abs: 18.2 10*3/uL — ABNORMAL HIGH (ref 1.7–7.7)
Neutro Abs: 20.8 10*3/uL — ABNORMAL HIGH (ref 1.7–7.7)
Neutro Abs: 20.7 10*3/uL — ABNORMAL HIGH (ref 1.7–7.7)
Neutrophils Relative %: 91 %
Neutrophils Relative %: 92 %
Neutrophils Relative %: 95 %
Neutrophils Relative %: 96 %
Platelets: 91 10*3/uL — ABNORMAL LOW (ref 150–400)
Platelets: 85 10*3/uL — ABNORMAL LOW (ref 150–400)
Platelets: 74 10*3/uL — ABNORMAL LOW (ref 150–400)
Platelets: 63 10*3/uL — ABNORMAL LOW (ref 150–400)
RBC: 3.58 MIL/uL — ABNORMAL LOW (ref 3.87–5.11)
RBC: 3.76 MIL/uL — ABNORMAL LOW (ref 3.87–5.11)
RBC: 3.98 MIL/uL (ref 3.87–5.11)
RBC: 3.77 MIL/uL — ABNORMAL LOW (ref 3.87–5.11)
RDW: 17.1 % — ABNORMAL HIGH (ref 11.5–15.5)
RDW: 17.2 % — ABNORMAL HIGH (ref 11.5–15.5)
RDW: 17.8 % — ABNORMAL HIGH (ref 11.5–15.5)
RDW: 17.8 % — ABNORMAL HIGH (ref 11.5–15.5)
WBC: 19.5 10*3/uL — ABNORMAL HIGH (ref 4.0–10.5)
WBC: 19.7 10*3/uL — ABNORMAL HIGH (ref 4.0–10.5)
WBC: 21.8 10*3/uL — ABNORMAL HIGH (ref 4.0–10.5)
WBC: 21.5 10*3/uL — ABNORMAL HIGH (ref 4.0–10.5)
nRBC: 0.4 % — ABNORMAL HIGH (ref 0.0–0.2)
nRBC: 0.3 % — ABNORMAL HIGH (ref 0.0–0.2)
nRBC: 0.3 % — ABNORMAL HIGH (ref 0.0–0.2)
nRBC: 0.3 % — ABNORMAL HIGH (ref 0.0–0.2)

## 2023-09-08 LAB — BLOOD GAS, ARTERIAL
Acid-base deficit: 8.6 mmol/L — ABNORMAL HIGH (ref 0.0–2.0)
Bicarbonate: 15.5 mmol/L — ABNORMAL LOW (ref 20.0–28.0)
Delivery systems: POSITIVE
FIO2: 100 %
O2 Saturation: 100 %
Patient temperature: 37
pCO2 arterial: 28 mm[Hg] — ABNORMAL LOW (ref 32–48)
pH, Arterial: 7.35 (ref 7.35–7.45)
pO2, Arterial: 134 mm[Hg] — ABNORMAL HIGH (ref 83–108)

## 2023-09-08 LAB — BASIC METABOLIC PANEL
Anion gap: 21 — ABNORMAL HIGH (ref 5–15)
Anion gap: 17 — ABNORMAL HIGH (ref 5–15)
Anion gap: 21 — ABNORMAL HIGH (ref 5–15)
BUN: 79 mg/dL — ABNORMAL HIGH (ref 8–23)
BUN: 78 mg/dL — ABNORMAL HIGH (ref 8–23)
BUN: 80 mg/dL — ABNORMAL HIGH (ref 8–23)
CO2: 12 mmol/L — ABNORMAL LOW (ref 22–32)
CO2: 16 mmol/L — ABNORMAL LOW (ref 22–32)
CO2: 11 mmol/L — ABNORMAL LOW (ref 22–32)
Calcium: 8.1 mg/dL — ABNORMAL LOW (ref 8.9–10.3)
Calcium: 7.4 mg/dL — ABNORMAL LOW (ref 8.9–10.3)
Calcium: 7.6 mg/dL — ABNORMAL LOW (ref 8.9–10.3)
Chloride: 119 mmol/L — ABNORMAL HIGH (ref 98–111)
Chloride: 118 mmol/L — ABNORMAL HIGH (ref 98–111)
Chloride: 120 mmol/L — ABNORMAL HIGH (ref 98–111)
Creatinine, Ser: 2.02 mg/dL — ABNORMAL HIGH (ref 0.44–1.00)
Creatinine, Ser: 1.84 mg/dL — ABNORMAL HIGH (ref 0.44–1.00)
Creatinine, Ser: 1.85 mg/dL — ABNORMAL HIGH (ref 0.44–1.00)
GFR, Estimated: 27 mL/min — ABNORMAL LOW (ref >60)
GFR, Estimated: 30 mL/min — ABNORMAL LOW (ref >60)
GFR, Estimated: 30 mL/min — ABNORMAL LOW (ref >60)
Glucose, Bld: 110 mg/dL — ABNORMAL HIGH (ref 70–99)
Glucose, Bld: 106 mg/dL — ABNORMAL HIGH (ref 70–99)
Glucose, Bld: 79 mg/dL (ref 70–99)
Potassium: 3.2 mmol/L — ABNORMAL LOW (ref 3.5–5.1)
Potassium: 3.1 mmol/L — ABNORMAL LOW (ref 3.5–5.1)
Potassium: 3.5 mmol/L (ref 3.5–5.1)
Sodium: 152 mmol/L — ABNORMAL HIGH (ref 135–145)
Sodium: 151 mmol/L — ABNORMAL HIGH (ref 135–145)
Sodium: 152 mmol/L — ABNORMAL HIGH (ref 135–145)

## 2023-09-08 LAB — HEPATIC FUNCTION PANEL
ALT: 26 U/L (ref 0–44)
AST: 43 U/L — ABNORMAL HIGH (ref 15–41)
Albumin: 2.5 g/dL — ABNORMAL LOW (ref 3.5–5.0)
Alkaline Phosphatase: 101 U/L (ref 38–126)
Bilirubin, Direct: 0.4 mg/dL — ABNORMAL HIGH (ref 0.0–0.2)
Indirect Bilirubin: 0.6 mg/dL (ref 0.3–0.9)
Total Bilirubin: 1 mg/dL (ref 0.0–1.2)
Total Protein: 4.5 g/dL — ABNORMAL LOW (ref 6.5–8.1)

## 2023-09-08 LAB — BLOOD GAS, VENOUS
Acid-base deficit: 10.1 mmol/L — ABNORMAL HIGH (ref 0.0–2.0)
Acid-base deficit: 12 mmol/L — ABNORMAL HIGH (ref 0.0–2.0)
Bicarbonate: 12 mmol/L — ABNORMAL LOW (ref 20.0–28.0)
Bicarbonate: 17 mmol/L — ABNORMAL LOW (ref 20.0–28.0)
O2 Saturation: 97.1 %
O2 Saturation: 97.7 %
Patient temperature: 37
Patient temperature: 37
pCO2, Ven: 19 mm[Hg] — CL (ref 44–60)
pCO2, Ven: 50 mm[Hg] (ref 44–60)
pH, Ven: 7.41 (ref 7.25–7.43)
pH, Ven: 7.14 — CL (ref 7.25–7.43)
pO2, Ven: 75 mm[Hg] — ABNORMAL HIGH (ref 32–45)
pO2, Ven: 91 mm[Hg] — ABNORMAL HIGH (ref 32–45)

## 2023-09-08 LAB — BPAM PLATELET PHERESIS
Blood Product Expiration Date: 202501092359
ISSUE DATE / TIME: 202501071734
Unit Type and Rh: 5100

## 2023-09-08 LAB — PREPARE PLATELET PHERESIS: Unit division: 0

## 2023-09-08 LAB — LACTIC ACID, PLASMA
Lactic Acid, Venous: 8.9 mmol/L (ref 0.5–1.9)
Lactic Acid, Venous: 8.6 mmol/L (ref 0.5–1.9)
Lactic Acid, Venous: 6.5 mmol/L (ref 0.5–1.9)

## 2023-09-08 LAB — MAGNESIUM: Magnesium: 1.7 mg/dL (ref 1.7–2.4)

## 2023-09-08 LAB — PHOSPHORUS: Phosphorus: 1.9 mg/dL — ABNORMAL LOW (ref 2.5–4.6)

## 2023-09-08 LAB — PREPARE FRESH FROZEN PLASMA: Unit division: 0

## 2023-09-08 LAB — BPAM FFP
Blood Product Expiration Date: 202501122359
ISSUE DATE / TIME: 202501071831
Unit Type and Rh: 6200

## 2023-09-08 LAB — GLUCOSE, CAPILLARY
Glucose-Capillary: 111 mg/dL — ABNORMAL HIGH (ref 70–99)
Glucose-Capillary: 95 mg/dL (ref 70–99)
Glucose-Capillary: 94 mg/dL (ref 70–99)
Glucose-Capillary: 78 mg/dL (ref 70–99)
Glucose-Capillary: 78 mg/dL (ref 70–99)

## 2023-09-08 LAB — PROCALCITONIN: Procalcitonin: 5.06 ng/mL

## 2023-09-08 LAB — MRSA NEXT GEN BY PCR, NASAL: MRSA by PCR Next Gen: NOT DETECTED

## 2023-09-08 SURGERY — EGD (ESOPHAGOGASTRODUODENOSCOPY)
Anesthesia: General

## 2023-09-08 MED ORDER — VANCOMYCIN VARIABLE DOSE PER UNSTABLE RENAL FUNCTION (PHARMACIST DOSING): Status: DC

## 2023-09-08 MED ORDER — PHENYLEPHRINE 80 MCG/ML (10ML) SYRINGE FOR IV PUSH (FOR BLOOD PRESSURE SUPPORT)
PREFILLED_SYRINGE | INTRAVENOUS | Status: DC | PRN
  Administered 2023-09-08: 160 ug via INTRAVENOUS

## 2023-09-08 MED ORDER — SODIUM CHLORIDE 0.9 % IV SOLN
2.0000 g | INTRAVENOUS | Status: DC
  Administered 2023-09-08: 2 g via INTRAVENOUS
  Filled 2023-09-08: qty 20

## 2023-09-08 MED ORDER — SODIUM CHLORIDE 0.9 % IV SOLN: INTRAVENOUS | Status: DC | PRN

## 2023-09-08 MED ORDER — ETOMIDATE 2 MG/ML IV SOLN: 20.0000 mg | Freq: Once | INTRAVENOUS | Status: DC

## 2023-09-08 MED ORDER — POTASSIUM PHOSPHATES 15 MMOLE/5ML IV SOLN
30.0000 mmol | Freq: Once | INTRAVENOUS | Status: AC
  Administered 2023-09-08: 30 mmol via INTRAVENOUS
  Filled 2023-09-08: qty 10

## 2023-09-08 MED ORDER — POTASSIUM PHOSPHATES 15 MMOLE/5ML IV SOLN
30.0000 mmol | Freq: Once | INTRAVENOUS | Status: DC
  Filled 2023-09-08: qty 10

## 2023-09-08 MED ORDER — VANCOMYCIN HCL 1500 MG/300ML IV SOLN
1500.0000 mg | INTRAVENOUS | Status: DC
  Administered 2023-09-08: 1500 mg via INTRAVENOUS
  Filled 2023-09-08: qty 300

## 2023-09-08 MED ORDER — DEXTROSE 5 % IV SOLN
INTRAVENOUS | Status: DC
Start: 2023-09-08 — End: 2023-09-09

## 2023-09-08 MED ORDER — SUCCINYLCHOLINE CHLORIDE 200 MG/10ML IV SOSY
PREFILLED_SYRINGE | INTRAVENOUS | Status: DC | PRN
  Administered 2023-09-08: 120 mg via INTRAVENOUS

## 2023-09-08 MED ORDER — LIDOCAINE HCL (CARDIAC) PF 100 MG/5ML IV SOSY
PREFILLED_SYRINGE | INTRAVENOUS | Status: DC | PRN
  Administered 2023-09-08: 40 mg via INTRAVENOUS

## 2023-09-08 MED ORDER — EPHEDRINE SULFATE-NACL 50-0.9 MG/10ML-% IV SOSY
PREFILLED_SYRINGE | INTRAVENOUS | Status: DC | PRN
  Administered 2023-09-08: 10 mg via INTRAVENOUS

## 2023-09-08 MED ORDER — POTASSIUM CHLORIDE 10 MEQ/100ML IV SOLN
10.0000 meq | INTRAVENOUS | Status: AC
  Administered 2023-09-08 (×2): 10 meq via INTRAVENOUS
  Filled 2023-09-08 (×2): qty 100

## 2023-09-08 MED ORDER — SUCCINYLCHOLINE CHLORIDE 200 MG/10ML IV SOSY: 1.5000 mg/kg | PREFILLED_SYRINGE | Freq: Once | INTRAVENOUS | Status: DC

## 2023-09-08 MED ORDER — DEXAMETHASONE SODIUM PHOSPHATE 10 MG/ML IJ SOLN
INTRAMUSCULAR | Status: DC | PRN
  Administered 2023-09-08: 5 mg via INTRAVENOUS

## 2023-09-08 MED ORDER — POTASSIUM CHLORIDE 10 MEQ/100ML IV SOLN
10.0000 meq | INTRAVENOUS | Status: DC
  Filled 2023-09-08 (×3): qty 100

## 2023-09-08 MED ORDER — PROPOFOL 10 MG/ML IV BOLUS
INTRAVENOUS | Status: DC | PRN
  Administered 2023-09-08: 80 mg via INTRAVENOUS

## 2023-09-08 MED ORDER — MAGNESIUM SULFATE 2 GM/50ML IV SOLN
2.0000 g | Freq: Once | INTRAVENOUS | Status: AC
Start: 2023-09-08 — End: 2023-09-08
  Administered 2023-09-08: 2 g via INTRAVENOUS
  Filled 2023-09-08: qty 50

## 2023-09-08 MED ORDER — PIPERACILLIN-TAZOBACTAM 3.375 G IVPB
3.3750 g | Freq: Three times a day (TID) | INTRAVENOUS | Status: DC
  Administered 2023-09-08 – 2023-09-11 (×8): 3.375 g via INTRAVENOUS
  Filled 2023-09-08 (×9): qty 50

## 2023-09-08 MED ORDER — ONDANSETRON HCL 4 MG/2ML IJ SOLN
INTRAMUSCULAR | Status: DC | PRN
  Administered 2023-09-08: 4 mg via INTRAVENOUS

## 2023-09-08 NOTE — Consult Note (Addendum)
 Pharmacy Antibiotic Note  ASSESSMENT: 63 y.o. female with PMH including erosive esophagitis, seizure, history of CVA, achalasia, T2DM, COPD is presenting with concerns for HAP. Patient initially presented on 09/03/2022 with sepsis in setting of hemorrhagic shock and was placed on broad-spectrum antibiotics for prophylaxis given UGIB. Now concern for HAP with CXR exhibiting patchy infiltrate with increasing O2 requirements. WBC elevated at 21.8. Pharmacy has been consulted to manage Zosyn  and vancomycin  dosing.  Patient measurements: Height: 5' 6 (167.6 cm) Weight: 72.6 kg (160 lb 0.9 oz) IBW/kg (Calculated) : 59.3  Vital signs: Temp: 97.9 F (36.6 C) (01/08 0701) Temp Source: Temporal (01/08 1244) BP: 111/72 (01/08 1615) Pulse Rate: 98 (01/08 1615) Recent Labs  Lab 09/07/23 0440 09/07/23 1230 09/07/23 2243 09/08/23 0533 09/08/23 1027 09/08/23 1529  WBC 27.7*   < > 20.8* 19.5* 19.7* 21.8*  CREATININE 1.82*  --  2.14* 2.02*  --   --    < > = values in this interval not displayed.   Estimated Creatinine Clearance: 29.1 mL/min (A) (by C-G formula based on SCr of 2.02 mg/dL (H)).  Allergies: No Known Allergies  Antimicrobials this admission: Vancomycin  1/4 x 1 Metronidazole  1/4 >> 1/8 Doxycycline  1/7 >> 1/8 Cefepime  1/7 >> 1/8 Ceftriaxone  1/5 >> 1/8 Zosyn  1/8 >> Vancomycin  1/8 >>  Dose adjustments this admission: N/A  Microbiology results: 1/8 MRSA PCR: ordered 1/6 MRSA PCR: negative 1/4 Urine cx: Insignificant growth 1/4 RVP: negative 1/4 Bcx: NGTD  PLAN: Initiate Zosyn  3.375 g IV q8H Initiate vancomycin  1500 mg IV q48H eAUC 540, Cmax 40, Cmin 11.6 Scr 2.02, IBW, Vd 0.72 Serum creatinine looks to be improving but will order 24-hour vancomycin  random in case it increases tomorrow Follow up new MRSA PCR and culture results to assess for antibiotic optimization. Monitor renal function to assess for any necessary antibiotic dosing changes.   Thank you for allowing  pharmacy to be a part of this patient's care.  Will M. Lenon, PharmD Clinical Pharmacist 09/08/2023 4:31 PM

## 2023-09-08 NOTE — Progress Notes (Addendum)
 Brief Progress Note:  Patient with worsening mental status and increased respiratory rate. Repeat blood work showed a pH of 7.14 on the VBG, concerning for worsening metabolic acidosis (in the setting of poor kidney function) despite improving lactic acid. Patient is also hypoxic and her CXR is showing a RLL pneumonia. I am concerned that she is unable to protect her airway and that the metabolic acidosis could worsen resulting in circulatory shock. I have called her son to recommend intubation for ventilatory support but he asked that we hold off until he's had the opportunity to present to the bedside and discuss with her sister.  Update: Discussed with son and daughter, both are ok with proceeding with intubation. At the time of the initial conversation, we did place the patient on BiPAP for pre-oxygenation and in preparation for intubation. She seemed to be tolerating the mask well and is more awake at the time of my interview. Will obtain an ABG and re-assess need for intubation.  Belva November, MD [facility] 09/08/2023 6:15 PM

## 2023-09-08 NOTE — TOC CM/SW Note (Signed)
 Transition of Care [facility]) - Inpatient Brief Assessment   Patient Details  Name: Kaitlyn Hodges MRN: [id number] Date of Birth: 31-Jan-1961  Transition of Care [facility]) CM/SW Contact:    Lauraine JAYSON Carpen, LCSW Phone Number: 09/08/2023, 12:46 PM   Clinical Narrative: CSW acknowledges consult for home health/DME needs. Patient will need PT and OT consults when appropriate. CSW will continue to follow.  Transition of Care Asessment: Insurance and Status: Insurance coverage has been reviewed Patient has primary care physician: Yes Home environment has been reviewed: Single family home Prior level of function:: Not documented Prior/Current Home Services: No current home services Social Drivers of Health Review: SDOH reviewed no interventions necessary Readmission risk has been reviewed: Yes Transition of care needs: transition of care needs identified, TOC will continue to follow

## 2023-09-08 NOTE — Progress Notes (Signed)
 PHARMACY CONSULT NOTE - FOLLOW UP  Pharmacy Consult for Electrolyte Monitoring and Replacement   Recent Labs: Potassium (mmol/L)  Date Value  09/08/2023 3.2 (L)   Magnesium  (mg/dL)  Date Value  98/91/7974 1.7   Calcium (mg/dL)  Date Value  98/91/7974 8.1 (L)   Albumin  (g/dL)  Date Value  98/91/7974 2.5 (L)   Phosphorus (mg/dL)  Date Value  98/91/7974 1.9 (L)   Sodium (mmol/L)  Date Value  09/08/2023 152 (H)  04/19/2012 142     Assessment: 63 y.o. female with medical history significant of prediabetes, CVA, seizure disorder, type I achalasia,tobacco use disorder, erosive esophagitis who presents to the ED due to altered mental status. Pharmacy is asked to follow and replace electrolytes while in CCU  Goal of Therapy:  Electrolytes WNL  Plan:  ---2 grams IV magnesium  sulfate x 1 ---30 mmol IV potassium phosphate  IV x 1 (contains 44 mEq IV potassium) ---recheck electrolytes in am  [name] JONETTA Bolster ,PharmD Clinical Pharmacist 09/08/2023 7:09 AM

## 2023-09-08 NOTE — Anesthesia Procedure Notes (Signed)
 Procedure Name: Intubation Date/Time: 09/08/2023 1:10 PM  Performed by: Lennie Lamarr HERO, CRNAPre-anesthesia Checklist: Patient identified, Emergency Drugs available, Suction available and Patient being monitored Patient Re-evaluated:Patient Re-evaluated prior to induction Oxygen Delivery Method: Circle System Utilized Preoxygenation: Pre-oxygenation with 100% oxygen Induction Type: IV induction and Rapid sequence Laryngoscope Size: McGrath and 4 Grade View: Grade I Tube type: Oral Tube size: 7.0 mm Number of attempts: 1 Airway Equipment and Method: Stylet and Oral airway Placement Confirmation: ETT inserted through vocal cords under direct vision, positive ETCO2 and breath sounds checked- equal and bilateral Secured at: 21 cm Tube secured with: Tape Dental Injury: Teeth and Oropharynx as per pre-operative assessment

## 2023-09-08 NOTE — Anesthesia Postprocedure Evaluation (Signed)
 Anesthesia Post Note  Patient: Kaitlyn Hodges  Procedure(s) Performed: ESOPHAGOGASTRODUODENOSCOPY (EGD)  Patient location during evaluation: SICU Anesthesia Type: General Level of consciousness: confused Pain management: pain level controlled Vital Signs Assessment: post-procedure vital signs reviewed and stable Respiratory status: spontaneous breathing and patient connected to nasal cannula oxygen Cardiovascular status: blood pressure returned to baseline and stable Postop Assessment: no apparent nausea or vomiting Anesthetic complications: no   No notable events documented.   Last Vitals:  Vitals:   09/08/23 1244 09/08/23 1400  BP: (!) 158/78 98/62  Pulse: 97 97  Resp: (!) 24 19  Temp:    SpO2: 97% (!) 88%    Last Pain:  Vitals:   09/08/23 1244  TempSrc: Temporal  PainSc: Asleep                 Fairy POUR Esai Stecklein

## 2023-09-08 NOTE — Anesthesia Preprocedure Evaluation (Addendum)
 Anesthesia Evaluation  Patient identified by MRN, date of birth, ID band Patient unresponsive    Reviewed: Allergy & Precautions, NPO status , Patient's Chart, lab work & pertinent test results  History of Anesthesia Complications Negative for: history of anesthetic complications  Airway Mallampati: III  TM Distance: >3 FB Neck ROM: full    Dental  (+) Chipped, Poor Dentition   Pulmonary pneumonia, unresolved, Current Smoker and Patient abstained from smoking.   + rhonchi  + decreased breath sounds      Cardiovascular negative cardio ROS Normal cardiovascular exam     Neuro/Psych Seizures -,  PSYCHIATRIC DISORDERS      CVA, Residual Symptoms    GI/Hepatic Neg liver ROS, PUD,,,  Endo/Other  negative endocrine ROS    Renal/GU Renal disease     Musculoskeletal   Abdominal   Peds  Hematology negative hematology ROS (+)   Anesthesia Other Findings Past Medical History: No date: Kidney stones No date: Stroke [facility])  Past Surgical History: 2003: ABDOMINAL HYSTERECTOMY     Comment:  Total; Polycystic ovarian Disease 01/17/2020: BIOPSY     Comment:  Procedure: BIOPSY;  Surgeon: Shila Gustav GAILS, MD;                Location: [facility];  Service: Endoscopy;; 12/14/2019: ESOPHAGOGASTRODUODENOSCOPY (EGD) WITH PROPOFOL ; N/A     Comment:  Procedure: ESOPHAGOGASTRODUODENOSCOPY (EGD) WITH               PROPOFOL ;  Surgeon: Therisa Bi, MD;  Location: [facility]               ENDOSCOPY;  Service: Gastroenterology;  Laterality: N/A; 01/17/2020: ESOPHAGOGASTRODUODENOSCOPY (EGD) WITH PROPOFOL ; N/A     Comment:  Procedure: ESOPHAGOGASTRODUODENOSCOPY (EGD) WITH               PROPOFOL ;  Surgeon: Shila Gustav GAILS, MD;  Location:               [facility];  Service: Endoscopy;  Laterality: N/A;                Gastric Lavage 01/17/2020: FOREIGN BODY REMOVAL     Comment:  Procedure: FOREIGN BODY REMOVAL;  Surgeon: Shila Gustav GAILS, MD;  Location: [facility];  Service:               Endoscopy;;  Food impaction in Esophagus  BMI    Body Mass Index: 25.83 kg/m      Reproductive/Obstetrics negative OB ROS                             Anesthesia Physical Anesthesia Plan  ASA: 3  Anesthesia Plan: General ETT   Post-op Pain Management:    Induction: Intravenous  PONV Risk Score and Plan: Ondansetron , Dexamethasone , Midazolam and Treatment may vary due to age or medical condition  Airway Management Planned: Oral ETT  Additional Equipment:   Intra-op Plan:   Post-operative Plan: Extubation in OR  Informed Consent: I have reviewed the patients History and Physical, chart, labs and discussed the procedure including the risks, benefits and alternatives for the proposed anesthesia with the patient or authorized representative who has indicated his/her understanding and acceptance.     Dental Advisory Given  Plan Discussed with: Anesthesiologist, CRNA and Surgeon  Anesthesia Plan Comments: (Son Mihaela Fajardo [phone]) consented for risks of anesthesia including but not  limited to:  - adverse reactions to medications - damage to eyes, teeth, lips or other oral mucosa - nerve damage due to positioning  - sore throat or hoarseness - Damage to heart, brain, nerves, lungs, other parts of body or loss of life  He voiced understanding and assent.)        Anesthesia Quick Evaluation

## 2023-09-08 NOTE — Progress Notes (Addendum)
 Progress Note   Patient: Kaitlyn Hodges FMW:[id number] DOB: Oct 25, 1960 DOA: 09/04/2023     4 DOS: the patient was seen and examined on 09/08/2023   Brief hospital course: Kaitlyn Hodges is a 63 y.o. female with medical history significant of prediabetes, CVA, seizure disorder, type I achalasia,tobacco use disorder, erosive esophagitis who presents to the ED due to altered mental status.  Patient's daughter noted patient had dyspnea and cough for a month prior to admission and for the last week she had N/V and poor oral intake.  Patient was afebrile, hypotensive and tachycardic on vitals. She had a mild acidosis with increased AG. Noted lactate of 7.7. WBC of 32.9. BUN/cr 114/4.61. COVID, flu, RSV negative. Pan scan notable for possible L parotiditis, central airway thickening and patchy airspace opacities in the bases, age indeterminate T3 fracture.  Patient was started on broad spectrum antibiotics and fluid resuscitated.        Assessment and Plan: Severe sepsis, resolving  Community-acquired pneumonia Patient presenting with hypotension, tachycardia, marked leukocytosis and lactic acidosis  Respiratory panel-negative Continue antibiotics, de-escalate antibiotics based on cultures Follow-up on culture results   Hemorrhagic shock secondary to acute GI bleed Gastroenterologist on board We will monitor CBC closely Patient received blood transfusion yesterday Also received a shot duration of Levophed  however currently weaned off Patient taking for endoscopy by GI today Plan of care discussed with gastroenterologist and intensivist   AKI (acute kidney injury) (HCC) 2/2 hypovolemia and severe sepsis. In the setting of poor p.o. intake, severe uncontrolled achalasia Continue hydration Monitor renal function closely   Hypokalemia-continue repletion and monitoring  Mild hypernatremia Patient received a course of D5W  Acute encephalopathy Improving  Encephalopathy improved Continue aspiration  precaution   Achalasia Patient follows up with GI outpatient   Pre-diabetes Notable hyperglycemia seen on admission, potentially stress response given history of prediabetes only with last A1c of 5.8%. Follow-up on A1c Continue monitoring glucose level   Closed wedge compression fracture of T3 vertebra (HCC) Incidental finding of a T3 compression fracture on imaging.  Patient's daughter reports a fall 2 weeks ago but states her mom did not complain of any pain afterwards.  Given mild nature (less than 10%) and age-indeterminate, will hold off on neurosurgery consultation. Continue pain management   History of CVA (cerebrovascular accident) Per chart review, patient has a history of CVA dating back to 2010.  CT of the head obtained showed chronic infarcts scattered along the left frontoparietal convexity in the left basal ganglia, as well as lacunar infarcts in the right thalamus.  No acute findings.  Does not appear patient is on statin or aspirin. This can be considered when patient is more stable    Seizure disorder [facility]) Per chart review, there is a reported history of seizure disorder that began after CVA in 2010.  No reported seizures since that time. Continue seizure precaution     Subjective:  Patient seen and examined in the ICU .  Appears lethargic Still having some melena Denied worsening abdominal pain chest pain or cough     Physical Exam:  Constitutional: In no distress.  HEENT: L parotid gland TTP, overlying skin mildly erythematous Cardiovascular: Normal rate, regular rhythm. No lower extremity edema  Pulmonary: Non labored breathing on room air, no wheezing or rales.  Abdominal: Soft. Normal bowel sounds. Non distended, diffusely tender to minimal palpation  Musculoskeletal: Normal range of motion.     Neurological: Alert and oriented to person, place, but not time. Non focal  Skin: Skin is warm and dry. +Hirsutisum      Data Reviewed:    Latest Ref Rng &  Units 09/08/2023    4:28 PM 09/08/2023    5:33 AM 09/07/2023   10:43 PM  BMP  Glucose 70 - 99 mg/dL 893  889  843   BUN 8 - 23 mg/dL 78  79  81   Creatinine 0.44 - 1.00 mg/dL 8.15  7.97  7.85   Sodium 135 - 145 mmol/L 151  152  150   Potassium 3.5 - 5.1 mmol/L 3.1  3.2  3.2   Chloride 98 - 111 mmol/L 118  119  119   CO2 22 - 32 mmol/L 16  12  7    Calcium 8.9 - 10.3 mg/dL 7.4  8.1  8.2        Latest Ref Rng & Units 09/08/2023    3:29 PM 09/08/2023   10:27 AM 09/08/2023    5:33 AM  CBC  WBC 4.0 - 10.5 K/uL 21.8  19.7  19.5   Hemoglobin 12.0 - 15.0 g/dL 87.3  88.0  88.6   Hematocrit 36.0 - 46.0 % 38.0  34.5  33.5   Platelets 150 - 400 K/uL 74  85  91     Vitals:   09/08/23 1530 09/08/23 1545 09/08/23 1600 09/08/23 1615  BP: 109/69 106/66 108/64 111/72  Pulse: 99 99 100 98  Resp: (!) 21 20  (!) 44  Temp:      TempSrc:      SpO2: 93% 94% 93% 93%  Weight:      Height:        Author: Drue ONEIDA Potter, MD 09/08/2023 5:43 PM  For on call review [email or web address].

## 2023-09-08 NOTE — Progress Notes (Signed)
 NAME:  Kaitlyn Hodges, MRN:  [id number], DOB:  02/18/1961, LOS: 4 ADMISSION DATE:  09/04/2023, CHIEF COMPLAINT:  Hemorrhagic Shock   History of Present Illness:   Patient is a pleasant 63 year old female presenting on 1/4 to the ED with weakness, altered mental status, and hypotension.   Patient admitted on 1/4 for the above chief complaint. Initial workup showed lactic acidosis with level up to 7.7, and notable leukocytosis with WBC up to 32.9 on presentation. Acute kidney injury also noted on initial CMP. Blood cultures were drawn and she received IV fluids and IV antibiotics with improvement. She was admitted to [facility] for further management.   Patient was admitted to the medical floor where she was noted to be hypotensive this AM, prompting transfer to the ICU. In the ICU, blood pressure was consistently low, and she was also noted to have a large amount of tarry liquid stools. Patient is tachypneic and continues to have altered mental status. ICU team consulted to help with management.  Pertinent  Medical History   -EGD 2021 with moderately severe erosive esophagitis and achalasia -Seizure Disorder -T2DM -History of CVA  Significant Hospital Events: Including procedures, antibiotic start and stop dates in addition to other pertinent events   1/4: admit to hospital 1/7: admit to ICU, maroon colored bowel movement, initiated on vasopressors and blood products administered 1/8: hemoglobin stable, off nor-epinephrine  Interim History / Subjective:   Patient is somnolent, but responsive. She is oriented to self and place. Denies abdominal pain.  Objective   Blood pressure 122/67, pulse (!) 108, temperature 97.9 F (36.6 C), temperature source Axillary, resp. rate 19, height 5' 6 (1.676 m), weight 72.6 kg, last menstrual period 02/10/2001, SpO2 99%.        Intake/Output Summary (Last 24 hours) at 09/08/2023 0800 Last data filed at 09/08/2023 0600 Gross per 24 hour  Intake 3104.81 ml   Output 550 ml  Net 2554.81 ml   Filed Weights   09/04/23 0919  Weight: 72.6 kg    Examination: Physical Exam Constitutional:      General: She is not in acute distress.    Appearance: She is ill-appearing.  Cardiovascular:     Rate and Rhythm: Normal rate and regular rhythm.     Pulses: Normal pulses.     Heart sounds: Normal heart sounds.  Pulmonary:     Effort: Pulmonary effort is normal.     Breath sounds: Normal breath sounds.  Abdominal:     Palpations: Abdomen is soft.  Neurological:     Mental Status: She is disoriented.     Motor: Weakness present.      Assessment & Plan:   Neurology #Toxic Metabolic Encephalopathy #History of CVA #Seizure Disorder   Presents with altered mental status to the hospital, and remains with AMS on admission to the ICU. Suspect toxic metabolic encephalopathy is secondary to her shock and decreased perfusion. CT head showed remote/old left MCA stroke, so recrudescence of previous symptoms are possible, as is a subclinical seizure. Once stable from her shock will proceed with EEG and MRI of the brain.   Cardiovascular #Hemorrhagic Shock   Initially presented with weakness and hypotension to the hospital, now with hemorrhagic shock following large melenotic stool, potentially secondary to upper GI bleed. No reported cardiac history. 4 peripheral IV's in place, and the patient was started on IV fluids, blood products, and IV vasopressor support with improvement. Lactic acid remains elevated, but is down trending from prior.   -  nor-epinephrine with goal MAP > 65 mmHg > now off -Received IV fluids, PRBC, platelets, and FFP -TTE without wall motion abnormalitites   Pulmonary #Shortness of Breath   Increased shortness of breath in the setting of shock with compensation for lactic acidosis. CT scan of the chest personally reviewed and doesn't show any focal infiltrates.   -goal SpO2 > 92%   Gastrointestinal #GI Bleed #History of  Achalasia   Has a history of achalasia and erosive esophagitis, but no history of prior GI bleed or cirrhosis. Developed melanotic stools with hemodynamic instability concerning for a brisk upper GI bleed. Initiated on high dose PPI, multiple peripheral IV's placed, and initiated on volume resuscitation. Appreciate input from GI.   -CBC q6hours -maintain two large bore IV's -high dose PPI -hold off on octreotide for now -antibiotics per ID section -consider EGD today   Renal #AKI   Presents with AKI, improved following volume resuscitation on presentation. Kidney function stabilized with resuscitation.   -continue to monitor kidney function and electrolytes -avoiding nephrotoxins -accurate I/O's   Endocrine   ICU glycemic protocol.   Hem/Onc #GI Bleed   Volume resuscitation with IV fluids, PRBC, FFP, and platelets. Resuscitated, enoxaparin  discontinued. Hemoglobin has been stable on repeat   -CBC q6hours -D/C DVT prophylaxis   ID   On broad spectrum antibiotics for prophylaxis given upper GI bleed. No signs of aspiration pneumonia on my review of the chest CT. Antibiotics were started to cover sepsis empirically on presentation. Blood cultures remain no growth to day.   -continue IV antibiotics for now -narrowing scope of antibiotics to Ceftriaxone    MSK #T3 Compression fracture   Pain control with tylenol  and oxycodone   Best Practice (right click and Reselect all SmartList Selections daily)   Diet/type: NPO DVT prophylaxis not indicated Pressure ulcer(s): N/A GI prophylaxis: PPI Lines: N/A Foley:  Yes, and it is still needed Code Status:  full code Last date of multidisciplinary goals of care discussion [09/08/2023]  Labs   CBC: Recent Labs  Lab 09/07/23 0440 09/07/23 1230 09/07/23 1748 09/07/23 2243 09/08/23 0533  WBC 27.7* 19.5* 24.6* 20.8* 19.5*  NEUTROABS 26.6* 18.5* 23.2* 19.4* 17.8*  HGB 11.2* 7.6* 10.6* 11.3* 11.3*  HCT 33.4* 24.4* 32.7*  35.3* 33.5*  MCV 98.2 103.4* 98.5 98.9 93.6  PLT 81* 62* 62* 108* 91*    Basic Metabolic Panel: Recent Labs  Lab 09/05/23 0451 09/06/23 0640 09/07/23 0440 09/07/23 2243 09/08/23 0533  NA 143 146* 152* 150* 152*  K 3.3* 3.9 2.9* 3.2* 3.2*  CL 107 113* 117* 119* 119*  CO2 19* 22 14* 7* 12*  GLUCOSE 99 86 130* 156* 110*  BUN 97* 78* 76* 81* 79*  CREATININE 3.01* 1.81* 1.82* 2.14* 2.02*  CALCIUM 8.0* 8.4* 8.0* 8.2* 8.1*  MG  --  2.4  --   --  1.7  PHOS  --   --   --   --  1.9*   GFR: Estimated Creatinine Clearance: 29.1 mL/min (A) (by C-G formula based on SCr of 2.02 mg/dL (H)). Recent Labs  Lab 09/04/23 1017 09/04/23 1041 09/07/23 1230 09/07/23 1434 09/07/23 1748 09/07/23 2243 09/08/23 0533  PROCALCITON 1.84  --   --   --   --   --   --   WBC  --    < > 19.5*  --  24.6* 20.8* 19.5*  LATICACIDVEN  --    < > >9.0* >9.0* >9.0* >9.0* 8.9*   < > = values in  this interval not displayed.    Liver Function Tests: Recent Labs  Lab 09/04/23 1041 09/05/23 0451 09/07/23 0440 09/08/23 0533  AST 33 24 35 43*  ALT 23 18 24 26   ALKPHOS 77 49 75 101  BILITOT 1.0 1.1 1.0 1.0  PROT 7.0 5.4* 4.5* 4.5*  ALBUMIN  3.2* 2.5* 1.9* 2.5*   No results for input(s): LIPASE, AMYLASE in the last 168 hours. No results for input(s): AMMONIA in the last 168 hours.  ABG    Component Value Date/Time   HCO3 12.0 (L) 09/08/2023 0533   ACIDBASEDEF 10.1 (H) 09/08/2023 0533   O2SAT 97.1 09/08/2023 0533     Coagulation Profile: Recent Labs  Lab 09/04/23 1041  INR 1.2    Cardiac Enzymes: Recent Labs  Lab 09/04/23 1041  CKTOTAL 60    HbA1C: Hgb A1c MFr Bld  Date/Time Value Ref Range Status  09/04/2023 10:41 AM 6.0 (H) 4.8 - 5.6 % Final    Comment:    (NOTE)         Prediabetes: 5.7 - 6.4         Diabetes: >6.4         Glycemic control for adults with diabetes: <7.0   04/19/2012 12:00 AM 5.8 4.0 - 6.0 % Final    CBG: Recent Labs  Lab 09/07/23 1716 09/07/23 2048  09/07/23 2323 09/08/23 0320 09/08/23 0729  GLUCAP 208* 155* 145* 111* 95    Past Medical History:  She,  has a past medical history of Kidney stones and Stroke (HCC).   Surgical History:   Past Surgical History:  Procedure Laterality Date   ABDOMINAL HYSTERECTOMY  2003   Total; Polycystic ovarian Disease   BIOPSY  01/17/2020   Procedure: BIOPSY;  Surgeon: Shila Gustav GAILS, MD;  Location: [facility] ENDOSCOPY;  Service: Endoscopy;;   ESOPHAGOGASTRODUODENOSCOPY (EGD) WITH PROPOFOL  N/A 12/14/2019   Procedure: ESOPHAGOGASTRODUODENOSCOPY (EGD) WITH PROPOFOL ;  Surgeon: Therisa Bi, MD;  Location: [facility] ENDOSCOPY;  Service: Gastroenterology;  Laterality: N/A;   ESOPHAGOGASTRODUODENOSCOPY (EGD) WITH PROPOFOL  N/A 01/17/2020   Procedure: ESOPHAGOGASTRODUODENOSCOPY (EGD) WITH PROPOFOL ;  Surgeon: Shila Gustav GAILS, MD;  Location: [facility] ENDOSCOPY;  Service: Endoscopy;  Laterality: N/A;  Gastric Lavage   FOREIGN BODY REMOVAL  01/17/2020   Procedure: FOREIGN BODY REMOVAL;  Surgeon: Shila Gustav GAILS, MD;  Location: [facility] ENDOSCOPY;  Service: Endoscopy;;  Food impaction in Esophagus     Social History:   reports that she has been smoking cigarettes. She has never used smokeless tobacco. She reports current alcohol use. She reports that she does not use drugs.   Family History:  Her family history includes Alcohol abuse in her father and maternal grandfather; Bone cancer in her father; Breast cancer in her mother; Heart attack in her paternal grandfather; Hypertension in her father and paternal grandmother; Ovarian cancer in her maternal grandmother; Raynaud syndrome in her mother; Skin cancer in her paternal grandmother and sister; Stroke in her mother and paternal grandfather.   Allergies No Known Allergies   Home Medications  Prior to Admission medications   Medication Sig Start Date End Date Taking? Authorizing Provider  amoxicillin -clavulanate (AUGMENTIN ) 875-125 MG tablet Take 1 tablet by mouth 2  (two) times daily. Patient not taking: Reported on 09/04/2023 03/03/21   Viviann Pastor, MD  benzonatate  (TESSALON ) 100 MG capsule Take 1 capsule (100 mg total) by mouth 2 (two) times daily as needed for cough. Patient not taking: Reported on 03/21/2021 01/21/21   Nedra Tinnie LABOR, NP  naproxen  (NAPROSYN ) 500  MG tablet Take 1 tablet (500 mg total) by mouth 2 (two) times daily with a meal. Patient not taking: Reported on 09/04/2023 03/03/21   Viviann Pastor, MD  predniSONE  (DELTASONE ) 10 MG tablet Take 6 tablets today, then 5 tablets tomorrow, then decrease by 1 tablet every day until gone Patient not taking: Reported on 09/04/2023 01/21/21   Nedra Tinnie LABOR, NP     Critical care time: 40 minutes   Belva November, MD [place] [facility] [facility] 09/08/2023 11:38 AM

## 2023-09-08 NOTE — Op Note (Signed)
 [facility] Gastroenterology Patient Name: Kaitlyn Hodges Procedure Date: 09/08/2023 12:50 PM MRN: [id number] Account #: [id number] Date of Birth: 01/01/1961 Admit Type: Outpatient Age: 63 Room: [facility] ENDO ROOM 4 Gender: Female Note Status: Finalized Instrument Name: Barnie Endoscope [id number] Procedure:             Upper GI endoscopy Indications:           Melena Providers:             Rogelia Copping MD, MD Referring MD:          Nancyann BRAVO. Gasper, MD (Referring MD) Medicines:             General Anesthesia Complications:         No immediate complications. Procedure:             Pre-Anesthesia Assessment:                        - Prior to the procedure, a History and Physical was                         performed, and patient medications and allergies were                         reviewed. The patient's tolerance of previous                         anesthesia was also reviewed. The risks and benefits                         of the procedure and the sedation options and risks                         were discussed with the patient. All questions were                         answered, and informed consent was obtained. Prior                         Anticoagulants: The patient has taken no anticoagulant                         or antiplatelet agents. ASA Grade Assessment: IV - A                         patient with severe systemic disease that is a                         constant threat to life. After reviewing the risks and                         benefits, the patient was deemed in satisfactory                         condition to undergo the procedure.                        After obtaining informed consent, the endoscope was  passed under direct vision. Throughout the procedure,                         the patient's blood pressure, pulse, and oxygen                         saturations were monitored continuously. The Endoscope                          was introduced through the mouth, and advanced to the                         second part of duodenum. The upper GI endoscopy was                         accomplished without difficulty. The patient tolerated                         the procedure well. Findings:      The examined esophagus was tortuous.      Hematin (altered blood/coffee-ground-like material) was found in the       entire esophagus.      LA Grade D (one or more mucosal breaks involving at least 75% of       esophageal circumference) esophagitis with no bleeding was found in the       entire esophagus.      The lumen of the esophagus was severely dilated.      Hematin (altered blood/coffee-ground-like material) was found in the       entire examined stomach.      Diffuse severely erythematous mucosa without active bleeding and with       stigmata of bleeding was found in the entire duodenum. Impression:            - Tortuous esophagus.                        - Hematin (altered blood/coffee-ground-like material)                         in the esophagus.                        - LA Grade D esophagitis with no bleeding.                        - Dilation in the entire esophagus.                        - Hematin (altered blood/coffee-ground-like material)                         in the entire stomach.                        - Erythematous duodenopathy.                        - No specimens collected. Recommendation:        - Return patient to ICU for ongoing care.                        -  NPO.                        - Continue present medications. Procedure Code(s):     --- Professional ---                        [phone], Esophagogastroduodenoscopy, flexible,                         transoral; diagnostic, including collection of                         specimen(s) by brushing or washing, when performed                         (separate procedure) Diagnosis Code(s):     --- Professional ---                        K92.1,  Melena (includes Hematochezia)                        K20.90, Esophagitis, unspecified without bleeding                        K31.89, Other diseases of stomach and duodenum CPT copyright 2022 American [employer] Association. All rights reserved. The codes documented in this report are preliminary and upon coder review may  be revised to meet current compliance requirements. Rogelia Copping MD, MD 09/08/2023 1:23:37 PM This report has been signed electronically. Number of Addenda: 0 Note Initiated On: 09/08/2023 12:50 PM Estimated Blood Loss:  Estimated blood loss: none.      [facility]

## 2023-09-08 NOTE — Transfer of Care (Signed)
 Immediate Anesthesia Transfer of Care Note  Patient: Kaitlyn Hodges  Procedure(s) Performed: ESOPHAGOGASTRODUODENOSCOPY (EGD)  Patient Location: ICU  Anesthesia Type:General  Level of Consciousness: drowsy, patient cooperative, and responds to stimulation  Airway & Oxygen Therapy: Patient Spontanous Breathing and Patient connected to face mask oxygen  Post-op Assessment: Report given to RN, Post -op Vital signs reviewed and stable, and Patient moving all extremities X 4  Post vital signs: Reviewed and stable  Last Vitals:  Vitals Value Taken Time  BP 105/69 09/08/23 1405  Temp    Pulse 101 09/08/23 1406  Resp 18 09/08/23 1406  SpO2 89 % 09/08/23 1406  Vitals shown include unfiled device data.  Last Pain:  Vitals:   09/08/23 1244  TempSrc: Temporal  PainSc: Asleep         Complications: No notable events documented.

## 2023-09-08 NOTE — Progress Notes (Signed)
 Received pt from endo lab into icu 1. Pt has a simple mask on at 5l o2. Sat 89. Pt will open her eyes to painful stimulus. Q 15 min. V/s. Will cont. to monitor.

## 2023-09-08 NOTE — Progress Notes (Signed)
 SLP Cancellation Note  Patient Details Name: Kaitlyn Hodges MRN: [id number] DOB: [birth year]   Cancelled treatment:       Reason Eval/Treat Not Completed: Medical issues which prohibited therapy  Pt continues being unable to maintain alertness for PO trials.   ST to continue to follow.   [name] B. Rubbie, M.S., CCC-SLP, CBIS Speech-Language Pathologist Certified Brain Injury Specialist [facility] [phone] Ascom [phone] Fax [phone]   [name] 09/08/2023, 9:05 AM

## 2023-09-09 ENCOUNTER — Ambulatory Visit: Payer: Medicaid Other

## 2023-09-09 ENCOUNTER — Encounter: Payer: Self-pay | Admitting: Gastroenterology

## 2023-09-09 ENCOUNTER — Inpatient Hospital Stay: Payer: Medicaid Other

## 2023-09-09 DIAGNOSIS — G9340 Encephalopathy, unspecified: Secondary | ICD-10-CM | POA: Diagnosis not present

## 2023-09-09 DIAGNOSIS — R578 Other shock: Secondary | ICD-10-CM

## 2023-09-09 DIAGNOSIS — R569 Unspecified convulsions: Secondary | ICD-10-CM

## 2023-09-09 DIAGNOSIS — K529 Noninfective gastroenteritis and colitis, unspecified: Secondary | ICD-10-CM

## 2023-09-09 DIAGNOSIS — K922 Gastrointestinal hemorrhage, unspecified: Secondary | ICD-10-CM | POA: Diagnosis not present

## 2023-09-09 DIAGNOSIS — E440 Moderate protein-calorie malnutrition: Secondary | ICD-10-CM | POA: Diagnosis present

## 2023-09-09 DIAGNOSIS — K220 Achalasia of cardia: Secondary | ICD-10-CM | POA: Diagnosis not present

## 2023-09-09 DIAGNOSIS — A419 Sepsis, unspecified organism: Secondary | ICD-10-CM | POA: Diagnosis not present

## 2023-09-09 DIAGNOSIS — K209 Esophagitis, unspecified without bleeding: Secondary | ICD-10-CM | POA: Diagnosis not present

## 2023-09-09 DIAGNOSIS — R4182 Altered mental status, unspecified: Secondary | ICD-10-CM | POA: Diagnosis not present

## 2023-09-09 DIAGNOSIS — N179 Acute kidney failure, unspecified: Secondary | ICD-10-CM | POA: Diagnosis not present

## 2023-09-09 LAB — CBC WITH DIFFERENTIAL/PLATELET
Abs Immature Granulocytes: 0.36 10*3/uL — ABNORMAL HIGH (ref 0.00–0.07)
Abs Immature Granulocytes: 0.24 10*3/uL — ABNORMAL HIGH (ref 0.00–0.07)
Abs Immature Granulocytes: 0.37 10*3/uL — ABNORMAL HIGH (ref 0.00–0.07)
Basophils Absolute: 0.1 10*3/uL (ref 0.0–0.1)
Basophils Absolute: 0 10*3/uL (ref 0.0–0.1)
Basophils Absolute: 0.1 10*3/uL (ref 0.0–0.1)
Basophils Relative: 0 %
Basophils Relative: 0 %
Basophils Relative: 0 %
Eosinophils Absolute: 0.8 10*3/uL — ABNORMAL HIGH (ref 0.0–0.5)
Eosinophils Absolute: 0 10*3/uL (ref 0.0–0.5)
Eosinophils Absolute: 0 10*3/uL (ref 0.0–0.5)
Eosinophils Relative: 4 %
Eosinophils Relative: 0 %
Eosinophils Relative: 0 %
HCT: 39.6 % (ref 36.0–46.0)
HCT: 36.5 % (ref 36.0–46.0)
HCT: 40.9 % (ref 36.0–46.0)
Hemoglobin: 13.4 g/dL (ref 12.0–15.0)
Hemoglobin: 12.8 g/dL (ref 12.0–15.0)
Hemoglobin: 14.1 g/dL (ref 12.0–15.0)
Immature Granulocytes: 2 %
Immature Granulocytes: 1 %
Immature Granulocytes: 2 %
Lymphocytes Relative: 3 %
Lymphocytes Relative: 3 %
Lymphocytes Relative: 3 %
Lymphs Abs: 0.6 10*3/uL — ABNORMAL LOW (ref 0.7–4.0)
Lymphs Abs: 0.6 10*3/uL — ABNORMAL LOW (ref 0.7–4.0)
Lymphs Abs: 0.8 10*3/uL (ref 0.7–4.0)
MCH: 31.3 pg (ref 26.0–34.0)
MCH: 31.9 pg (ref 26.0–34.0)
MCH: 31.3 pg (ref 26.0–34.0)
MCHC: 33.8 g/dL (ref 30.0–36.0)
MCHC: 35.1 g/dL (ref 30.0–36.0)
MCHC: 34.5 g/dL (ref 30.0–36.0)
MCV: 92.5 fL (ref 80.0–100.0)
MCV: 91 fL (ref 80.0–100.0)
MCV: 90.7 fL (ref 80.0–100.0)
Monocytes Absolute: 0.1 10*3/uL (ref 0.1–1.0)
Monocytes Absolute: 0.2 10*3/uL (ref 0.1–1.0)
Monocytes Absolute: 0.1 10*3/uL (ref 0.1–1.0)
Monocytes Relative: 0 %
Monocytes Relative: 1 %
Monocytes Relative: 1 %
Neutro Abs: 20.8 10*3/uL — ABNORMAL HIGH (ref 1.7–7.7)
Neutro Abs: 21.3 10*3/uL — ABNORMAL HIGH (ref 1.7–7.7)
Neutro Abs: 22.1 10*3/uL — ABNORMAL HIGH (ref 1.7–7.7)
Neutrophils Relative %: 91 %
Neutrophils Relative %: 95 %
Neutrophils Relative %: 94 %
Platelets: 58 10*3/uL — ABNORMAL LOW (ref 150–400)
Platelets: 53 10*3/uL — ABNORMAL LOW (ref 150–400)
Platelets: 53 10*3/uL — ABNORMAL LOW (ref 150–400)
RBC: 4.28 MIL/uL (ref 3.87–5.11)
RBC: 4.01 MIL/uL (ref 3.87–5.11)
RBC: 4.51 MIL/uL (ref 3.87–5.11)
RDW: 18 % — ABNORMAL HIGH (ref 11.5–15.5)
RDW: 17.9 % — ABNORMAL HIGH (ref 11.5–15.5)
RDW: 17.9 % — ABNORMAL HIGH (ref 11.5–15.5)
Smear Review: NORMAL
WBC: 22.8 10*3/uL — ABNORMAL HIGH (ref 4.0–10.5)
WBC: 22.4 10*3/uL — ABNORMAL HIGH (ref 4.0–10.5)
WBC: 23.4 10*3/uL — ABNORMAL HIGH (ref 4.0–10.5)
nRBC: 0.3 % — ABNORMAL HIGH (ref 0.0–0.2)
nRBC: 0.4 % — ABNORMAL HIGH (ref 0.0–0.2)
nRBC: 0.3 % — ABNORMAL HIGH (ref 0.0–0.2)

## 2023-09-09 LAB — BLOOD GAS, VENOUS
Acid-base deficit: 10.2 mmol/L — ABNORMAL HIGH (ref 0.0–2.0)
Acid-base deficit: 9.3 mmol/L — ABNORMAL HIGH (ref 0.0–2.0)
Bicarbonate: 14.6 mmol/L — ABNORMAL LOW (ref 20.0–28.0)
Bicarbonate: 15.5 mmol/L — ABNORMAL LOW (ref 20.0–28.0)
O2 Saturation: 84.6 %
O2 Saturation: 61.4 %
Patient temperature: 37
Patient temperature: 37
pCO2, Ven: 29 mm[Hg] — ABNORMAL LOW (ref 44–60)
pCO2, Ven: 30 mm[Hg] — ABNORMAL LOW (ref 44–60)
pH, Ven: 7.31 (ref 7.25–7.43)
pH, Ven: 7.32 (ref 7.25–7.43)
pO2, Ven: 54 mm[Hg] — ABNORMAL HIGH (ref 32–45)
pO2, Ven: 37 mm[Hg] (ref 32–45)

## 2023-09-09 LAB — MAGNESIUM: Magnesium: 1.9 mg/dL (ref 1.7–2.4)

## 2023-09-09 LAB — CULTURE, BLOOD (ROUTINE X 2)
Culture: NO GROWTH
Culture: NO GROWTH
Special Requests: ADEQUATE
Special Requests: ADEQUATE

## 2023-09-09 LAB — PROCALCITONIN: Procalcitonin: 5.25 ng/mL

## 2023-09-09 LAB — BASIC METABOLIC PANEL
Anion gap: 23 — ABNORMAL HIGH (ref 5–15)
BUN: 77 mg/dL — ABNORMAL HIGH (ref 8–23)
CO2: 12 mmol/L — ABNORMAL LOW (ref 22–32)
Calcium: 7.7 mg/dL — ABNORMAL LOW (ref 8.9–10.3)
Chloride: 115 mmol/L — ABNORMAL HIGH (ref 98–111)
Creatinine, Ser: 1.79 mg/dL — ABNORMAL HIGH (ref 0.44–1.00)
GFR, Estimated: 31 mL/min — ABNORMAL LOW (ref >60)
Glucose, Bld: 87 mg/dL (ref 70–99)
Potassium: 2.5 mmol/L — CL (ref 3.5–5.1)
Sodium: 150 mmol/L — ABNORMAL HIGH (ref 135–145)

## 2023-09-09 LAB — GLUCOSE, CAPILLARY
Glucose-Capillary: 114 mg/dL — ABNORMAL HIGH (ref 70–99)
Glucose-Capillary: 141 mg/dL — ABNORMAL HIGH (ref 70–99)
Glucose-Capillary: 150 mg/dL — ABNORMAL HIGH (ref 70–99)
Glucose-Capillary: 66 mg/dL — ABNORMAL LOW (ref 70–99)
Glucose-Capillary: 70 mg/dL (ref 70–99)
Glucose-Capillary: 76 mg/dL (ref 70–99)
Glucose-Capillary: 77 mg/dL (ref 70–99)
Glucose-Capillary: 95 mg/dL (ref 70–99)

## 2023-09-09 LAB — POTASSIUM
Potassium: 2.6 mmol/L — CL (ref 3.5–5.1)
Potassium: 3.5 mmol/L (ref 3.5–5.1)
Potassium: 3.4 mmol/L — ABNORMAL LOW (ref 3.5–5.1)

## 2023-09-09 LAB — LACTIC ACID, PLASMA
Lactic Acid, Venous: 8.4 mmol/L (ref 0.5–1.9)
Lactic Acid, Venous: 7.2 mmol/L (ref 0.5–1.9)
Lactic Acid, Venous: 2.3 mmol/L (ref 0.5–1.9)

## 2023-09-09 LAB — PHOSPHORUS: Phosphorus: 2.9 mg/dL (ref 2.5–4.6)

## 2023-09-09 MED ORDER — SODIUM BICARBONATE 8.4 % IV SOLN
INTRAVENOUS | Status: DC
  Filled 2023-09-09 (×2): qty 150
  Filled 2023-09-09: qty 1000
  Filled 2023-09-09: qty 150
  Filled 2023-09-09 (×2): qty 1000

## 2023-09-09 MED ORDER — JUVEN PO PACK
1.0000 | PACK | Freq: Two times a day (BID) | ORAL | Status: DC
  Administered 2023-09-10 – 2023-09-30 (×36): 1

## 2023-09-09 MED ORDER — POTASSIUM CHLORIDE 10 MEQ/100ML IV SOLN
10.0000 meq | INTRAVENOUS | Status: AC
  Administered 2023-09-09 (×4): 10 meq via INTRAVENOUS
  Filled 2023-09-09 (×4): qty 100

## 2023-09-09 MED ORDER — THIAMINE MONONITRATE 100 MG PO TABS: 100.0000 mg | ORAL_TABLET | Freq: Every day | ORAL | Status: DC

## 2023-09-09 MED ORDER — GADOBUTROL 1 MMOL/ML IV SOLN
7.0000 mL | Freq: Once | INTRAVENOUS | Status: AC | PRN
  Administered 2023-09-09: 7 mL via INTRAVENOUS

## 2023-09-09 MED ORDER — ORAL CARE MOUTH RINSE: 15.0000 mL | OROMUCOSAL | Status: DC | PRN

## 2023-09-09 MED ORDER — THIAMINE HCL 100 MG/ML IJ SOLN
500.0000 mg | INTRAMUSCULAR | Status: AC
  Administered 2023-09-09 – 2023-09-11 (×3): 500 mg via INTRAVENOUS
  Filled 2023-09-09 (×3): qty 5

## 2023-09-09 MED ORDER — ORAL CARE MOUTH RINSE
15.0000 mL | OROMUCOSAL | Status: DC
  Administered 2023-09-09 – 2023-09-30 (×83): 15 mL via OROMUCOSAL

## 2023-09-09 MED ORDER — THIAMINE HCL 100 MG/ML IJ SOLN
100.0000 mg | Freq: Every day | INTRAMUSCULAR | Status: DC
  Administered 2023-09-12 – 2023-09-16 (×5): 100 mg via INTRAVENOUS
  Filled 2023-09-09 (×5): qty 2

## 2023-09-09 MED ORDER — FREE WATER
150.0000 mL | Status: DC
Start: 2023-09-09 — End: 2023-09-10
  Administered 2023-09-09 – 2023-09-10 (×4): 150 mL

## 2023-09-09 MED ORDER — DEXTROSE 50 % IV SOLN
12.5000 g | INTRAVENOUS | Status: AC
  Administered 2023-09-09: 12.5 g via INTRAVENOUS
  Filled 2023-09-09: qty 50

## 2023-09-09 MED ORDER — OSMOLITE 1.5 CAL PO LIQD
1000.0000 mL | ORAL | Status: DC
  Administered 2023-09-09 – 2023-09-20 (×13): 1000 mL

## 2023-09-09 MED ORDER — DEXTROSE IN LACTATED RINGERS 5 % IV SOLN: INTRAVENOUS | Status: DC

## 2023-09-09 NOTE — Progress Notes (Signed)
 Initial Nutrition Assessment  DOCUMENTATION CODES:   Non-severe (moderate) malnutrition in context of chronic illness  INTERVENTION:   Once NGT in place: recommend:  Osmolite 1.5@55ml /hr- Initiate at 72ml/hr and increase by 10ml/hr q 8 hours until goal rate is reached.   Free water  flushes q4 hours   Regimen provides 1980kcal/day, 83g/day protein and 1980ml/day of free water .   Pt at high refeed risk; recommend monitor potassium, magnesium  and phosphorus labs daily until stable  Juven Fruit Punch BID via tube, each serving provides 95kcal and 2.5g of protein (amino acids glutamine and arginine)  Daily weights   NUTRITION DIAGNOSIS:   Moderate Malnutrition related to chronic illness as evidenced by mild fat depletion, moderate fat depletion, mild muscle depletion, moderate muscle depletion.  GOAL:   Patient will meet greater than or equal to 90% of their needs  MONITOR:   Labs, Weight trends, TF tolerance, I & O's, Skin  REASON FOR ASSESSMENT:   Rounds    ASSESSMENT:   63 y/o female with h/o seizures, dysphagia secondary to achalasia with esophageal dilation and numerous food impactions, CVA (2009), depression, DM and compression fracture who is admitted with AMS, AKI, GIB and sepsis.  -Pt s/p EGD 1/8; pt noted to have tortuous esophagus, esophagitis with dilation & erythematous duodenopathy   Visited pt's room today. Pt with AMS and is unable to provide any history. Per chart review, pt with a long history of achalasia with numerous EGDs noting tortuous esophagus and food impaction. Per family report, pt has been having issues with swallowing since childhood and apparently has several family members with similar issues. Pt did report that her swallowing became worse after her CVA in 2009. Per daughter report, pt has been able to compensate for her dysphagia well for years. Per chart, pt does appear weight stable pta if her admission weight is correct.   Pt  currently unable to take anything by mouth. Plan today is for NGT placement and nutrition support. Pt is at high refeed risk. Pt with hypernatremia; will add free water . Plan today is for MRI. Pt may require G-tube placement pending the progression of her mental status and ability to swallow.   No improvement in lactic acidosis; will add high dose IV thiamine .   Medications reviewed and include: protonix , thiamine , zosyn , KCl, Na bicarbonate  Labs reviewed: Na 150(H), K 2.5(L), BUN 77(H), creat 1.79(H), P 2.9 wnl, Mg 1.9 wnl Wbc- 22.4(H) Cbgs- 77, 76, 114, 66 x 24 hrs  AIC 6.0(H)- 1/4  NUTRITION - FOCUSED PHYSICAL EXAM:  Flowsheet Row Most Recent Value  Orbital Region Mild depletion  Upper Arm Region Moderate depletion  Thoracic and Lumbar Region Mild depletion  Buccal Region Mild depletion  Temple Region Moderate depletion  Clavicle Bone Region Moderate depletion  Clavicle and Acromion Bone Region Moderate depletion  Scapular Bone Region Mild depletion  Dorsal Hand Mild depletion  Patellar Region Moderate depletion  Anterior Thigh Region Moderate depletion  Posterior Calf Region Moderate depletion  Edema (RD Assessment) Mild  Hair Reviewed  Eyes Reviewed  Mouth Reviewed  Skin Reviewed  Nails Reviewed   Diet Order:   Diet Order             Diet NPO time specified  Diet effective now                  EDUCATION NEEDS:   No education needs have been identified at this time  Skin:  Skin Assessment: Reviewed RN Assessment (Stage II  buttocks)  Last BM:  1/9- type 7  Height:   Ht Readings from Last 1 Encounters:  09/04/23 5' 6 (1.676 m)    Weight:   Wt Readings from Last 1 Encounters:  09/04/23 72.6 kg    Ideal Body Weight:  59 kg  BMI:  Body mass index is 25.83 kg/m.  Estimated Nutritional Needs:   Kcal:  1700-1900kcal/day  Protein:  85-95g/day  Fluid:  1.8-2.1L/day  Augustin Shams MS, RD, LDN If unable to be reached, please send secure chat  to RD inpatient available from 8:00a-4:00p daily

## 2023-09-09 NOTE — Progress Notes (Signed)
 PHARMACY CONSULT NOTE - FOLLOW UP  Pharmacy Consult for Electrolyte Monitoring and Replacement   Recent Labs: Potassium (mmol/L)  Date Value  09/09/2023 2.5 (LL)   Magnesium  (mg/dL)  Date Value  98/90/7974 1.9   Calcium (mg/dL)  Date Value  98/90/7974 7.7 (L)   Albumin  (g/dL)  Date Value  98/91/7974 2.5 (L)   Phosphorus (mg/dL)  Date Value  98/90/7974 2.9   Sodium (mmol/L)  Date Value  09/09/2023 150 (H)  04/19/2012 142     Assessment: 63 y.o. female with medical history significant of prediabetes, CVA, seizure disorder, type I achalasia,tobacco use disorder, erosive esophagitis who presents to the ED due to altered mental status. Pharmacy is asked to follow and replace electrolytes while in CCU  MIVF: sodium bicarbonate  150 mEq in dextrose  5 at 75 mL/hr  Goal of Therapy:  Electrolytes WNL  Plan:  ---10 mEq IV KCl x 4 ---recheck electrolytes in am  [name] JONETTA Bolster ,PharmD Clinical Pharmacist 09/09/2023 8:13 AM

## 2023-09-09 NOTE — Progress Notes (Signed)
 1100-pt is off the unit to go to MRI. No tele is required per MD.

## 2023-09-09 NOTE — Progress Notes (Signed)
 I was asked to review this patient's case.  She has complicated past medical history including previous left-sided cerebrovascular events.  She had possibility of subdural hematomas or fluid collections in the setting of sepsis and altered mental status..  In the setting of her sepsis I asked for a MRI with contrast to evaluate for any empyema and to further evaluate the the question of fluid collections.  The final read will be pasted below but in summary demonstrated thickened dura with enhancement but no evidence of fluid collection to indicate any infection.  At this point given her previous neurologic insults as well as her altered mental status could consider a EEG to evaluate for any evidence of seizures as her threshold is likely decreased in the setting of sepsis and critical illness.  There does not appear to be any fluid collection, does not appear to be any current surgical indication.  Pasted below official results of the contrasted MRI scan.   Narrative & Impression  CLINICAL DATA:  Altered mental status. Abnormal MRI. Possible subdural collection.   EXAM: MRI HEAD WITH CONTRAST   TECHNIQUE: Multiplanar, multiecho pulse sequences of the brain and surrounding structures were obtained with intravenous contrast.   CONTRAST:  7mL GADAVIST  GADOBUTROL  1 MMOL/ML IV SOLN   COMPARISON:  MR head without contrast 09/09/2023   FINDINGS: Brain: This study is again moderately degraded by patient motion. Remote left MCA territory infarcts are present. Mild dural thickening and enhancement is present over the temporal and posterior frontal lobes bilaterally. No significant extra-axial collections are present. No parenchymal enhancement is present.   Vascular: Normal vascular enhancement present.   Skull and upper cervical spine: The craniocervical junction is normal. Upper cervical spine is within normal limits. Marrow signal is unremarkable.   Sinuses/Orbits: The paranasal  sinuses and mastoid air cells are clear. Bilateral lens replacements are noted. Globes and orbits are otherwise unremarkable.   IMPRESSION: 1. Mild dural thickening and enhancement over the temporal and posterior frontal lobes bilaterally. This is a nonspecific finding and may be related to recent surgery or trauma. No focal collection is present to suggest infection. 2. Remote left MCA territory infarcts.     Electronically Signed   By: Lonni Necessary M.D.   On: 09/09/2023 13:58

## 2023-09-09 NOTE — Progress Notes (Signed)
 SLP Cancellation Note  Patient Details Name: Kaitlyn Hodges MRN: [id number] DOB: 03-Jan-1961   Cancelled treatment:       Reason Eval/Treat Not Completed: Medical issues which prohibited therapy  Pt recently on BiPAP, transitioned to nasal cannula. She opened her eyes briefly as SLP entered her room but she remained unresponsive to her name with RR of 28, open mouth breathing. Pt is not able to maintain alertness for safe trials of PO intake. ST services to sign off at this time. Please re-consult should pt be able to maintain alertness and be able to follow basic 1 step directions.   Janelie Goltz B. Rubbie, M.S., CCC-SLP, CBIS Speech-Language Pathologist Certified Brain Injury Specialist [facility]  [facility] [phone] Ascom [phone] Fax [phone]   Claudetta Rubbie 09/09/2023, 9:27 AM

## 2023-09-09 NOTE — Progress Notes (Signed)
 Eeg done

## 2023-09-09 NOTE — Progress Notes (Signed)
 Progress Note   Patient: Kaitlyn Hodges FMW:[id number] DOB: 1960-10-10 DOA: 09/04/2023     5 DOS: the patient was seen and examined on 09/09/2023   Brief hospital course: Kaitlyn Hodges is a 63 y.o. female with medical history significant of prediabetes, CVA, seizure disorder, type I achalasia,tobacco use disorder, erosive esophagitis who presents to the ED due to altered mental status.  Patient's daughter noted patient had dyspnea and cough for a month prior to admission and for the last week she had N/V and poor oral intake.  Patient was afebrile, hypotensive and tachycardic on vitals. She had a mild acidosis with increased AG. Noted lactate of 7.7. WBC of 32.9. BUN/cr 114/4.61. COVID, flu, RSV negative. Pan scan notable for possible L parotiditis, central airway thickening and patchy airspace opacities in the bases, age indeterminate T3 fracture.  Patient was started on broad spectrum antibiotics and fluid resuscitated.   Assessment and Plan: Severe sepsis Community-acquired pneumonia Patient presenting with hypotension, tachycardia, marked leukocytosis and lactic acidosis.  Patient's white count, lactic acid still elevated. Respiratory panel-negative Continue Zosyn  therapy. Follow-up on culture results. Lactic acidosis-unknown explanation at this time for continues to elevated lactic acid.   Hemorrhagic shock secondary to acute GI bleed Gastroenterologist on board, status post EGD on 09/08/2023, showed, multiple stomach ulcers. Continue to monitor CBC closely Patient received blood transfusion on [address]  now.   AKI (acute kidney injury) (HCC) 2/2 hypovolemia and severe sepsis. In the setting of poor p.o. intake, severe uncontrolled achalasia Continue gentle IV hydration Monitor renal function closely. Avoid nephrotoxic drugs.   Hypokalemia-continue repletion and monitoring Potassium 2.5 today.  Continue to replace with IV potassium supplementation.  Hypernatremia Sodium stable  around 150. Patient received a course of D5W   Acute encephalopathy In the setting of sepsis, hypovolemia, hemorrhagic shock, electrolyte abnormalities. Mental status still not baseline. MRI brain done today Dural thickening versus thin subdural collection overlying the left cerebral hemisphere, measuring up to 3-4 mm in thickness. Not significant per discussion of PCCM with neurosurgery, EEG pending. Continue nursing supportive care. Neurochecks. Delirium precautions. Continue aspiration precaution   Achalasia Patient follows up with GI outpatient for dilation.   Pre-diabetes Blood sugars lower side. Hypoglycemia protocol. Continue D5 water .   Closed wedge compression fracture of T3 vertebra (HCC) Incidental finding of a T3 compression fracture on imaging.  Patient's daughter reports a fall 2 weeks ago but states her mom did not complain of any pain afterwards.  Given mild nature (less than 10%) and age-indeterminate, will hold off on neurosurgery consultation. Continue pain management   History of CVA (cerebrovascular accident) Per chart review, patient has a history of CVA dating back to 2010.  CT of the head obtained showed chronic infarcts scattered along the left frontoparietal convexity in the left basal ganglia, as well as lacunar infarcts in the right thalamus.  No acute findings.  Does not appear patient is on statin or aspirin. This can be considered when patient is more stable   Seizure disorder [facility]) Per chart review, there is a reported history of seizure disorder that began after CVA in 2010.  No reported seizures since that time. EEG ordered by intensivist. Continue seizure precautions.  Nutrition Documentation    Flowsheet Row ED to Hosp-Admission (Current) from 09/04/2023 in [facility] ICU/CCU  Nutrition Problem Moderate Malnutrition  Etiology chronic illness  Nutrition Goal Patient will meet greater than or equal to 90% of their needs      ,  Active  Pressure Injury/Wound(s)     Pressure Ulcer  Duration          Pressure Injury 09/06/23 Buttocks Medial Stage 2 -  Partial thickness loss of dermis presenting as a shallow open injury with a red, pink wound bed without slough. small stage 2, skin breakdown at top of gluteal cleft 2 days           Out of bed to chair. Incentive spirometry. Nursing supportive care. Fall, aspiration precautions. DVT prophylaxis   Code Status: Full Code Subjective: Patient is seen and examined today in ICU room 1.  Her blood pressures are better, still tachycardic.  She seems to be sleepy and lethargic, EEG technician at bedside.  No family at bedside.  Discussed with ICU attending.  Her lactic acid is elevated.  MRI brain unremarkable.  Physical Exam: Vitals:   09/09/23 0900 09/09/23 1000 09/09/23 1100 09/09/23 1200  BP: 120/72 126/80 (!) 129/90   Pulse: (!) 106 (!) 108 (!) 108   Resp: (!) 23 (!) 25 (!) 26   Temp:    98.1 F (36.7 C)  TempSrc:    Axillary  SpO2: 96% 97% 97%   Weight:      Height:        General - Elderly ill looking Caucasian female, lethargic HEENT - PERRLA, EOMI, atraumatic head, non tender sinuses. Lung - Clear, diffuse rales, rhonchi, no wheezes. Heart - S1, S2 heard, no murmurs, rubs, trace pedal edema. Abdomen - Soft, non tender, nondistended, bowel sounds good Neuro -sleep, arousable, unable to do full neuroexam Skin - Warm and dry.  Data Reviewed:      Latest Ref Rng & Units 09/09/2023    3:45 PM 09/09/2023   10:53 AM 09/09/2023    7:24 AM  CBC  WBC 4.0 - 10.5 K/uL 23.4  22.4  22.8   Hemoglobin 12.0 - 15.0 g/dL 85.8  87.1  86.5   Hematocrit 36.0 - 46.0 % 40.9  36.5  39.6   Platelets 150 - 400 K/uL 53  53  58       Latest Ref Rng & Units 09/09/2023    1:56 PM 09/09/2023    7:24 AM 09/08/2023    7:53 PM  BMP  Glucose 70 - 99 mg/dL  87  79   BUN 8 - 23 mg/dL  77  80   Creatinine 9.55 - 1.00 mg/dL  8.20  8.14   Sodium 864 - 145 mmol/L  150  152    Potassium 3.5 - 5.1 mmol/L 2.6  2.5  3.5   Chloride 98 - 111 mmol/L  115  120   CO2 22 - 32 mmol/L  12  11   Calcium 8.9 - 10.3 mg/dL  7.7  7.6    MR BRAIN W CONTRAST Result Date: 09/09/2023 CLINICAL DATA:  Altered mental status. Abnormal MRI. Possible subdural collection. EXAM: MRI HEAD WITH CONTRAST TECHNIQUE: Multiplanar, multiecho pulse sequences of the brain and surrounding structures were obtained with intravenous contrast. CONTRAST:  7mL GADAVIST  GADOBUTROL  1 MMOL/ML IV SOLN COMPARISON:  MR head without contrast 09/09/2023 FINDINGS: Brain: This study is again moderately degraded by patient motion. Remote left MCA territory infarcts are present. Mild dural thickening and enhancement is present over the temporal and posterior frontal lobes bilaterally. No significant extra-axial collections are present. No parenchymal enhancement is present. Vascular: Normal vascular enhancement present. Skull and upper cervical spine: The craniocervical junction is normal. Upper cervical spine is within normal limits. Marrow signal is  unremarkable. Sinuses/Orbits: The paranasal sinuses and mastoid air cells are clear. Bilateral lens replacements are noted. Globes and orbits are otherwise unremarkable. IMPRESSION: 1. Mild dural thickening and enhancement over the temporal and posterior frontal lobes bilaterally. This is a nonspecific finding and may be related to recent surgery or trauma. No focal collection is present to suggest infection. 2. Remote left MCA territory infarcts. Electronically Signed   By: Lonni Necessary M.D.   On: 09/09/2023 13:58   MR BRAIN WO CONTRAST Addendum Date: 09/09/2023 ADDENDUM REPORT: 09/09/2023 12:34 ADDENDUM: Impression #3 called by telephone at the time of interpretation on 09/09/2023 at 12:16 pm to provider DANA NELSON , who verbally acknowledged these results. Electronically Signed   By: Rockey Childs D.O.   On: 09/09/2023 12:34   Result Date: 09/09/2023 CLINICAL DATA:  Provided  history: Mental status change, unknown cause. EXAM: MRI HEAD WITHOUT CONTRAST TECHNIQUE: Multiplanar, multiecho pulse sequences of the brain and surrounding structures were obtained without intravenous contrast. COMPARISON:  Head CT 09/04/2023.  Brain MRI 05/02/2009. FINDINGS: Multiple sequences are significantly motion degraded. Most notably, the axial T2 sequence is severely motion degraded, the axial FLAIR sequence is severely motion degraded and the axial T1 sequence is severely motion degraded. Within this limitation, findings are as follows. Brain: Mild generalized cerebral atrophy. Chronic left MCA territory infarcts within the cortical/subcortical left frontal and parietal lobes, and within the left basal ganglia. Additional chronic left MCA territory cortical/subcortical infarct at the junction of the left temporal, parietal and occipital lobes. Chronic hemosiderin deposition associated with some of these infarcts. Small chronic cortically-based infarct more posteriorly within the left parietal lobe (PCA territory). Chronic lacunar infarct within the right thalamus. Background mild patchy and ill-defined hypoattenuation within the cerebral white matter, nonspecific but compatible chronic small vessel ischemic disease. Dural thickening versus thin subdural collection overlying the left cerebral hemisphere, measuring up to 3-4 mm in thickness (for instance as seen on series 9, image 29). Dural thickening versus trace subdural collection overlying the right cerebral hemisphere (measuring up to 2 mm in thickness (for instance as seen on series 9, image 29 There is no acute infarct. Small focus of diffusion-weighted signal hyperintensity within the right frontal lobe centrum semiovale (with corresponding T2 shine through on the ADC map) (series 5, image 35). No evidence of an intracranial mass. No midline shift. Vascular: Within the limitations of significant motion degradation, no definite loss of proximal  arterial flow voids. Skull and upper cervical spine: Within limitations of motion degradation, no focal worrisome marrow lesion. Incompletely assessed cervical spondylosis. Sinuses/Orbits: Within limitations of motion degradation, no acute orbital finding. 16 mm mucous retention cyst within the right maxillary sinus. Attempts are being made to reach the ordering provider at this time regarding impression #3 IMPRESSION: 1. Significantly motion degraded examination, limiting evaluation. 2. The diffusion-weighted imaging is of good quality and there is no evidence of an acute infarct. 3. Dural thickening versus subdural collections/possible subdural hematomas overlying the bilateral cerebral hemispheres, more prominent on the left and measuring up to 4 mm in thickness. No significant mass effect. 4. Parenchymal atrophy, chronic small vessel ischemic disease and chronic infarcts, as outlined within the body of the report. 5. 16 mm right maxillary sinus mucous retention cyst. Electronically Signed: By: Rockey Childs D.O. On: 09/09/2023 12:11   DG Chest Port 1 View Result Date: 09/08/2023 CLINICAL DATA:  Dyspnea EXAM: PORTABLE CHEST 1 VIEW COMPARISON:  09/07/2023 FINDINGS: Cardiac shadow is stable. Distended esophagus with air is noted  over the right chest. Bibasilar infiltrative changes are noted right greater than left increased from the previous day. No bony abnormality is noted. IMPRESSION: Increasing right-sided infiltrate Electronically Signed   By: Oneil Devonshire M.D.   On: 09/08/2023 17:07    Disposition: Status is: Inpatient for severe electrolyte abnormalities, very high white count, high lactic acid Remains inpatient appropriate because: Stepdown unit  Planned Discharge Destination: Skilled nursing facility     MDM level 3-patient is sick presented with altered mentation, hemorrhagic shock, respiratory failure, GI bleed.  Patient's lactic acid still greater than 8, her kidney function is not baseline,  potassium low.  She is at high risk for sudden clinical deterioration.  Author: Concepcion Riser, MD 09/09/2023 4:29 PM Secure chat 7am to 7pm For on call review [email or web address].

## 2023-09-09 NOTE — Plan of Care (Signed)
  Problem: Clinical Measurements: Goal: Diagnostic test results will improve Outcome: Progressing   Problem: Respiratory: Goal: Ability to maintain adequate ventilation will improve Outcome: Progressing   Problem: Clinical Measurements: Goal: Ability to maintain clinical measurements within normal limits will improve Outcome: Progressing

## 2023-09-09 NOTE — Progress Notes (Signed)
 Kaitlyn Copping, MD [facility]   [address], [address] [place], [place] [address] Phone: [phone] Fax : [phone]   Subjective: The patient has had no further sign of GI bleeding.  The patient's EGD showed diffuse duodenitis with a dilated tortuous esophagus with a dilated esophagus.  The small bowel that was investigated showed a thick layer of mucus with old blood on the surface.  This was overlying friable inflamed mucosa.  There is no sign of any active bleeding.   Objective: Vital signs in last 24 hours: Vitals:   09/09/23 0900 09/09/23 1000 09/09/23 1100 09/09/23 1200  BP: 120/72 126/80 (!) 129/90   Pulse: (!) 106 (!) 108 (!) 108   Resp: (!) 23 (!) 25 (!) 26   Temp:    98.1 F (36.7 C)  TempSrc:    Axillary  SpO2: 96% 97% 97%   Weight:      Height:       Weight change:   Intake/Output Summary (Last 24 hours) at 09/09/2023 1425 Last data filed at 09/09/2023 0946 Gross per 24 hour  Intake 175.44 ml  Output 860 ml  Net -684.56 ml     Exam: Heart:: Regular rate and rhythm, S1S2 present, or without murmur or extra heart sounds Lungs: normal and clear to auscultation and percussion Abdomen: soft, nontender, normal bowel sounds   Lab Results: @LABTEST2 @ Micro Results: Recent Results (from the past 240 hours)  Blood Culture (routine x 2)     Status: None   Collection Time: 09/04/23 10:01 AM   Specimen: BLOOD  Result Value Ref Range Status   Specimen Description BLOOD BLOOD RIGHT FOREARM  Final   Special Requests   Final    BOTTLES DRAWN AEROBIC AND ANAEROBIC Blood Culture adequate volume   Culture   Final    NO GROWTH 5 DAYS Performed at [facility], [address] [place], [place] [address]    Report Status 09/09/2023 FINAL  Final  Resp panel by RT-PCR (RSV, Flu A&B, Covid) Anterior Nasal Swab     Status: None   Collection Time: 09/04/23 10:01 AM   Specimen: Anterior Nasal Swab  Result Value Ref Range Status   SARS Coronavirus 2 by RT PCR  NEGATIVE NEGATIVE Final    Comment: (NOTE) SARS-CoV-2 target nucleic acids are NOT DETECTED.  The SARS-CoV-2 RNA is generally detectable in upper respiratory specimens during the acute phase of infection. The lowest concentration of SARS-CoV-2 viral copies this assay can detect is 138 copies/mL. A negative result does not preclude SARS-Cov-2 infection and should not be used as the sole basis for treatment or other patient management decisions. A negative result may occur with  improper specimen collection/handling, submission of specimen other than nasopharyngeal swab, presence of viral mutation(s) within the areas targeted by this assay, and inadequate number of viral copies(<138 copies/mL). A negative result must be combined with clinical observations, patient history, and epidemiological information. The expected result is Negative.  Fact Sheet for Patients:  bloggercourse.com  Fact Sheet for Healthcare Providers:  seriousbroker.it  This test is no t yet approved or cleared by the [place]  FDA and  has been authorized for detection and/or diagnosis of SARS-CoV-2 by FDA under an Emergency Use Authorization (EUA). This EUA will remain  in effect (meaning this test can be used) for the duration of the COVID-19 declaration under Section 564(b)(1) of the Act, 21 U.S.C.section 360bbb-3(b)(1), unless the authorization is terminated  or revoked sooner.  Influenza A by PCR NEGATIVE NEGATIVE Final   Influenza B by PCR NEGATIVE NEGATIVE Final    Comment: (NOTE) The Xpert Xpress SARS-CoV-2/FLU/RSV plus assay is intended as an aid in the diagnosis of influenza from Nasopharyngeal swab specimens and should not be used as a sole basis for treatment. Nasal washings and aspirates are unacceptable for Xpert Xpress SARS-CoV-2/FLU/RSV testing.  Fact Sheet for Patients: bloggercourse.com  Fact Sheet for  Healthcare Providers: seriousbroker.it  This test is not yet approved or cleared by the [place]  FDA and has been authorized for detection and/or diagnosis of SARS-CoV-2 by FDA under an Emergency Use Authorization (EUA). This EUA will remain in effect (meaning this test can be used) for the duration of the COVID-19 declaration under Section 564(b)(1) of the Act, 21 U.S.C. section 360bbb-3(b)(1), unless the authorization is terminated or revoked.     Resp Syncytial Virus by PCR NEGATIVE NEGATIVE Final    Comment: (NOTE) Fact Sheet for Patients: bloggercourse.com  Fact Sheet for Healthcare Providers: seriousbroker.it  This test is not yet approved or cleared by the [place]  FDA and has been authorized for detection and/or diagnosis of SARS-CoV-2 by FDA under an Emergency Use Authorization (EUA). This EUA will remain in effect (meaning this test can be used) for the duration of the COVID-19 declaration under Section 564(b)(1) of the Act, 21 U.S.C. section 360bbb-3(b)(1), unless the authorization is terminated or revoked.  Performed at [facility], [address] [place], [place] [address]   Blood Culture (routine x 2)     Status: None   Collection Time: 09/04/23 10:41 AM   Specimen: BLOOD  Result Value Ref Range Status   Specimen Description BLOOD BLOOD LEFT WRIST  Final   Special Requests   Final    BOTTLES DRAWN AEROBIC AND ANAEROBIC Blood Culture adequate volume   Culture   Final    NO GROWTH 5 DAYS Performed at [facility], [address] [place], [place] [address]    Report Status 09/09/2023 FINAL  Final  Respiratory (~20 pathogens) panel by PCR     Status: None   Collection Time: 09/04/23  2:47 PM   Specimen: Nasopharyngeal Swab; Respiratory  Result Value Ref Range Status   Adenovirus NOT DETECTED NOT DETECTED Final   Coronavirus 229E NOT DETECTED NOT DETECTED  Final    Comment: (NOTE) The Coronavirus on the Respiratory Panel, DOES NOT test for the novel  Coronavirus (2019 nCoV)    Coronavirus HKU1 NOT DETECTED NOT DETECTED Final   Coronavirus NL63 NOT DETECTED NOT DETECTED Final   Coronavirus OC43 NOT DETECTED NOT DETECTED Final   Metapneumovirus NOT DETECTED NOT DETECTED Final   Rhinovirus / Enterovirus NOT DETECTED NOT DETECTED Final   Influenza A NOT DETECTED NOT DETECTED Final   Influenza B NOT DETECTED NOT DETECTED Final   Parainfluenza Virus 1 NOT DETECTED NOT DETECTED Final   Parainfluenza Virus 2 NOT DETECTED NOT DETECTED Final   Parainfluenza Virus 3 NOT DETECTED NOT DETECTED Final   Parainfluenza Virus 4 NOT DETECTED NOT DETECTED Final   Respiratory Syncytial Virus NOT DETECTED NOT DETECTED Final   Bordetella pertussis NOT DETECTED NOT DETECTED Final   Bordetella Parapertussis NOT DETECTED NOT DETECTED Final   Chlamydophila pneumoniae NOT DETECTED NOT DETECTED Final   Mycoplasma pneumoniae NOT DETECTED NOT DETECTED Final    Comment: Performed at [facility], [address], [place], [place] [address]  Urine Culture     Status: Abnormal   Collection Time:  09/04/23  6:00 PM   Specimen: Urine, Random  Result Value Ref Range Status   Specimen Description   Final    URINE, RANDOM Performed at [facility], [address] [place], [place] [address]    Special Requests   Final    NONE Reflexed from [id number] Performed at [facility], [address] [place], [place] [address]    Culture (A)  Final    <10,000 COLONIES/mL INSIGNIFICANT GROWTH Performed at [facility], [address], [place], [place] [address]    Report Status 09/06/2023 FINAL  Final  MRSA Next Gen by PCR, Nasal     Status: None   Collection Time: 09/06/23  8:33 AM   Specimen: Nasal Mucosa; Nasal Swab  Result Value Ref Range Status   MRSA by PCR Next Gen NOT DETECTED NOT DETECTED Final    Comment: (NOTE) The GeneXpert MRSA  Assay (FDA approved for NASAL specimens only), is one component of a comprehensive MRSA colonization surveillance program. It is not intended to diagnose MRSA infection nor to guide or monitor treatment for MRSA infections. Test performance is not FDA approved in patients less than 14 years old. Performed at [facility], [address] [place], [place] [address]   MRSA Next Gen by PCR, Nasal     Status: None   Collection Time: 09/08/23  4:20 PM   Specimen: Nasal Mucosa; Nasal Swab  Result Value Ref Range Status   MRSA by PCR Next Gen NOT DETECTED NOT DETECTED Final    Comment: (NOTE) The GeneXpert MRSA Assay (FDA approved for NASAL specimens only), is one component of a comprehensive MRSA colonization surveillance program. It is not intended to diagnose MRSA infection nor to guide or monitor treatment for MRSA infections. Test performance is not FDA approved in patients less than 9 years old. Performed at [facility], [address], [place], [place] [address]    Studies/Results: MR BRAIN W CONTRAST Result Date: 09/09/2023 CLINICAL DATA:  Altered mental status. Abnormal MRI. Possible subdural collection. EXAM: MRI HEAD WITH CONTRAST TECHNIQUE: Multiplanar, multiecho pulse sequences of the brain and surrounding structures were obtained with intravenous contrast. CONTRAST:  7mL GADAVIST  GADOBUTROL  1 MMOL/ML IV SOLN COMPARISON:  MR head without contrast 09/09/2023 FINDINGS: Brain: This study is again moderately degraded by patient motion. Remote left MCA territory infarcts are present. Mild dural thickening and enhancement is present over the temporal and posterior frontal lobes bilaterally. No significant extra-axial collections are present. No parenchymal enhancement is present. Vascular: Normal vascular enhancement present. Skull and upper cervical spine: The craniocervical junction is normal. Upper cervical spine is within normal limits. Marrow signal is unremarkable.  Sinuses/Orbits: The paranasal sinuses and mastoid air cells are clear. Bilateral lens replacements are noted. Globes and orbits are otherwise unremarkable. IMPRESSION: 1. Mild dural thickening and enhancement over the temporal and posterior frontal lobes bilaterally. This is a nonspecific finding and may be related to recent surgery or trauma. No focal collection is present to suggest infection. 2. Remote left MCA territory infarcts. Electronically Signed   By: Lonni Necessary M.D.   On: 09/09/2023 13:58   MR BRAIN WO CONTRAST Addendum Date: 09/09/2023 ADDENDUM REPORT: 09/09/2023 12:34 ADDENDUM: Impression #3 called by telephone at the time of interpretation on 09/09/2023 at 12:16 pm to provider DANA NELSON , who verbally acknowledged these results. Electronically Signed   By: Rockey Childs D.O.   On: 09/09/2023 12:34   Result Date: 09/09/2023 CLINICAL DATA:  Provided  history: Mental status change, unknown cause. EXAM: MRI HEAD WITHOUT CONTRAST TECHNIQUE: Multiplanar, multiecho pulse sequences of the brain and surrounding structures were obtained without intravenous contrast. COMPARISON:  Head CT 09/04/2023.  Brain MRI 05/02/2009. FINDINGS: Multiple sequences are significantly motion degraded. Most notably, the axial T2 sequence is severely motion degraded, the axial FLAIR sequence is severely motion degraded and the axial T1 sequence is severely motion degraded. Within this limitation, findings are as follows. Brain: Mild generalized cerebral atrophy. Chronic left MCA territory infarcts within the cortical/subcortical left frontal and parietal lobes, and within the left basal ganglia. Additional chronic left MCA territory cortical/subcortical infarct at the junction of the left temporal, parietal and occipital lobes. Chronic hemosiderin deposition associated with some of these infarcts. Small chronic cortically-based infarct more posteriorly within the left parietal lobe (PCA territory). Chronic lacunar  infarct within the right thalamus. Background mild patchy and ill-defined hypoattenuation within the cerebral white matter, nonspecific but compatible chronic small vessel ischemic disease. Dural thickening versus thin subdural collection overlying the left cerebral hemisphere, measuring up to 3-4 mm in thickness (for instance as seen on series 9, image 29). Dural thickening versus trace subdural collection overlying the right cerebral hemisphere (measuring up to 2 mm in thickness (for instance as seen on series 9, image 29 There is no acute infarct. Small focus of diffusion-weighted signal hyperintensity within the right frontal lobe centrum semiovale (with corresponding T2 shine through on the ADC map) (series 5, image 35). No evidence of an intracranial mass. No midline shift. Vascular: Within the limitations of significant motion degradation, no definite loss of proximal arterial flow voids. Skull and upper cervical spine: Within limitations of motion degradation, no focal worrisome marrow lesion. Incompletely assessed cervical spondylosis. Sinuses/Orbits: Within limitations of motion degradation, no acute orbital finding. 16 mm mucous retention cyst within the right maxillary sinus. Attempts are being made to reach the ordering provider at this time regarding impression #3 IMPRESSION: 1. Significantly motion degraded examination, limiting evaluation. 2. The diffusion-weighted imaging is of good quality and there is no evidence of an acute infarct. 3. Dural thickening versus subdural collections/possible subdural hematomas overlying the bilateral cerebral hemispheres, more prominent on the left and measuring up to 4 mm in thickness. No significant mass effect. 4. Parenchymal atrophy, chronic small vessel ischemic disease and chronic infarcts, as outlined within the body of the report. 5. 16 mm right maxillary sinus mucous retention cyst. Electronically Signed: By: Rockey Childs D.O. On: 09/09/2023 12:11   DG  Chest Port 1 View Result Date: 09/08/2023 CLINICAL DATA:  Dyspnea EXAM: PORTABLE CHEST 1 VIEW COMPARISON:  09/07/2023 FINDINGS: Cardiac shadow is stable. Distended esophagus with air is noted over the right chest. Bibasilar infiltrative changes are noted right greater than left increased from the previous day. No bony abnormality is noted. IMPRESSION: Increasing right-sided infiltrate Electronically Signed   By: Oneil Devonshire M.D.   On: 09/08/2023 17:07   ECHOCARDIOGRAM COMPLETE Result Date: 09/07/2023    ECHOCARDIOGRAM REPORT   Patient Name:   Kaitlyn Hodges Date of Exam: 09/07/2023 Medical Rec #:  [id number]  Height:       66.0 in Accession #:    [id number] Weight:       160.1 lb Date of Birth:  Mar 16, 1961 BSA:          1.819 m Patient Age:    62 years   BP:           91/46 mmHg Patient Gender: F  HR:           105 bpm. Exam Location:  [facility] Procedure: 2D Echo, Cardiac Doppler and Color Doppler STAT ECHO Indications:     Shock R57.9  History:         Patient has no prior history of Echocardiogram examinations.                  Stroke; Risk Factors:Diabetes.  Sonographer:     Christopher Furnace Referring Phys:  [id number] KHABIB DGAYLI Diagnosing Phys: Lonni Hanson MD  Sonographer Comments: Image acquisition challenging due to uncooperative patient. IMPRESSIONS  1. Left ventricular ejection fraction, by estimation, is 70 to 75%. The left ventricle has hyperdynamic function. Left ventricular endocardial border not optimally defined to evaluate regional wall motion. There is mild left ventricular hypertrophy. Left ventricular diastolic parameters were normal.  2. Right ventricular systolic function is normal. The right ventricular size is normal. Mildly increased right ventricular wall thickness. Tricuspid regurgitation signal is inadequate for assessing PA pressure.  3. The mitral valve is grossly normal. Trivial mitral valve regurgitation. No evidence of mitral stenosis.  4. The aortic valve was not well visualized.  Aortic valve regurgitation is not visualized. No aortic stenosis is present.  5. The inferior vena cava is normal in size with greater than 50% respiratory variability, suggesting right atrial pressure of 3 mmHg. FINDINGS  Left Ventricle: Left ventricular ejection fraction, by estimation, is 70 to 75%. The left ventricle has hyperdynamic function. Left ventricular endocardial border not optimally defined to evaluate regional wall motion. The left ventricular internal cavity size was normal in size. There is mild left ventricular hypertrophy. Left ventricular diastolic parameters were normal. Right Ventricle: The right ventricular size is normal. Mildly increased right ventricular wall thickness. Right ventricular systolic function is normal. Tricuspid regurgitation signal is inadequate for assessing PA pressure. Left Atrium: Left atrial size was normal in size. Right Atrium: Right atrial size was normal in size. Pericardium: Trivial pericardial effusion is present. Mitral Valve: The mitral valve is grossly normal. Trivial mitral valve regurgitation. No evidence of mitral valve stenosis. MV peak gradient, 3.7 mmHg. The mean mitral valve gradient is 2.0 mmHg. Tricuspid Valve: The tricuspid valve is not well visualized. Tricuspid valve regurgitation is trivial. Aortic Valve: The aortic valve was not well visualized. Aortic valve regurgitation is not visualized. No aortic stenosis is present. Aortic valve mean gradient measures 6.0 mmHg. Aortic valve peak gradient measures 9.4 mmHg. Aortic valve area, by VTI measures 3.37 cm. Pulmonic Valve: The pulmonic valve was not well visualized. Pulmonic valve regurgitation is not visualized. No evidence of pulmonic stenosis. Aorta: The aortic root is normal in size and structure. Pulmonary Artery: The pulmonary artery is not well seen. Venous: The inferior vena cava is normal in size with greater than 50% respiratory variability, suggesting right atrial pressure of 3 mmHg.  IAS/Shunts: The interatrial septum was not well visualized.  LEFT VENTRICLE PLAX 2D LVIDd:         3.30 cm   Diastology LVIDs:         1.60 cm   LV e' medial:    9.14 cm/s LV PW:         1.10 cm   LV E/e' medial:  9.4 LV IVS:        1.30 cm   LV e' lateral:   10.60 cm/s LVOT diam:     1.90 cm   LV E/e' lateral: 8.1 LV SV:  89 LV SV Index:   49 LVOT Area:     2.84 cm  RIGHT VENTRICLE RV Basal diam:  3.20 cm RV Mid diam:    3.00 cm RV S prime:     19.80 cm/s TAPSE (M-mode): 2.0 cm LEFT ATRIUM           Index        RIGHT ATRIUM           Index LA diam:      2.00 cm 1.10 cm/m   RA Area:     13.20 cm LA Vol (A2C): 41.8 ml 22.98 ml/m  RA Volume:   31.70 ml  17.43 ml/m LA Vol (A4C): 13.6 ml 7.48 ml/m  AORTIC VALVE AV Area (Vmax):    3.00 cm AV Area (Vmean):   2.91 cm AV Area (VTI):     3.37 cm AV Vmax:           153.00 cm/s AV Vmean:          113.000 cm/s AV VTI:            0.263 m AV Peak Grad:      9.4 mmHg AV Mean Grad:      6.0 mmHg LVOT Vmax:         162.00 cm/s LVOT Vmean:        116.000 cm/s LVOT VTI:          0.313 m LVOT/AV VTI ratio: 1.19  AORTA Ao Root diam: 3.10 cm MITRAL VALVE MV Area (PHT): 2.74 cm    SHUNTS MV Area VTI:   5.29 cm    Systemic VTI:  0.31 m MV Peak grad:  3.7 mmHg    Systemic Diam: 1.90 cm MV Mean grad:  2.0 mmHg MV Vmax:       0.97 m/s MV Vmean:      64.1 cm/s MV Decel Time: 277 msec MV E velocity: 85.90 cm/s MV A velocity: 73.40 cm/s MV E/A ratio:  1.17 Lonni End MD Electronically signed by Lonni Hanson MD Signature Date/Time: 09/07/2023/2:43:11 PM    Final    Medications: I have reviewed the patient's current medications. Scheduled Meds:  Chlorhexidine  Gluconate Cloth  6 each Topical Daily   free water   150 mL Per Tube Q4H   [START ON 09/10/2023] nutrition supplement (JUVEN)  1 packet Per Tube BID BM   mouth rinse  15 mL Mouth Rinse 4 times per day   pantoprazole  (PROTONIX ) IV  40 mg Intravenous Q12H   sodium chloride  flush  3 mL Intravenous Q12H   [START  ON 09/12/2023] thiamine  (VITAMIN B1) injection  100 mg Intravenous Daily   Continuous Infusions:  feeding supplement (OSMOLITE 1.5 CAL)     norepinephrine  (LEVOPHED ) Adult infusion Stopped (09/08/23 0007)   piperacillin -tazobactam (ZOSYN )  IV 3.375 g (09/09/23 0505)   sodium bicarbonate  150 mEq in dextrose  5 % 1,150 mL infusion 75 mL/hr at 09/09/23 0946   thiamine  (VITAMIN B1) injection 500 mg (09/09/23 1335)   PRN Meds:.acetaminophen  **OR** acetaminophen , ondansetron  **OR** ondansetron  (ZOFRAN ) IV, mouth rinse   Assessment: Principal Problem:   Severe sepsis (HCC) Active Problems:   Seizure disorder (HCC)   Achalasia   Pre-diabetes   Acute renal failure (HCC)   Acute encephalopathy   History of CVA (cerebrovascular accident)   Closed wedge compression fracture of T3 vertebra (HCC)   Melena   Hemorrhagic shock (HCC)   Gastrointestinal hemorrhage   Lactic acidosis   Pressure injury of skin  Malnutrition of moderate degree    Plan: The patient has had no further sign of GI bleeding and her hemoglobin has been stable.  There is diffuse inflammation in the esophagus and the small bowel which is not amenable to any endoscopic procedures at this time.  If the patient should start to show any sign of bleeding please do not hesitate to call.  I will sign off.  Please call if any further GI concerns or questions.  We would like to thank you for the opportunity to participate in the care of Kaitlyn Hodges.    LOS: 5 days   Kaitlyn Hodges 09/09/2023, 2:25 PM Pager [phone] 7am-5pm  Check AMION for 5pm -7am coverage and on weekends

## 2023-09-09 NOTE — Progress Notes (Signed)
 NAME:  Kaitlyn Hodges, MRN:  [id number], DOB:  [birth year], LOS: 5 ADMISSION DATE:  09/04/2023, CHIEF COMPLAINT:  Hemorrhagic Shock   History of Present Illness:   Patient is a pleasant 63 year old female presenting on 1/4 to the ED with weakness, altered mental status, and hypotension.   Patient admitted on 1/4 for the above chief complaint. Initial workup showed lactic acidosis with level up to 7.7, and notable leukocytosis with WBC up to 32.9 on presentation. Acute kidney injury also noted on initial CMP. Blood cultures were drawn and she received IV fluids and IV antibiotics with improvement. She was admitted to [facility] for further management.   Patient was admitted to the medical floor where she was noted to be hypotensive this AM, prompting transfer to the ICU. In the ICU, blood pressure was consistently low, and she was also noted to have a large amount of tarry liquid stools. Patient is tachypneic and continues to have altered mental status. ICU team consulted to help with management.  Pertinent  Medical History   -EGD 2021 with moderately severe erosive esophagitis and achalasia -Seizure Disorder -T2DM -History of CVA  Significant Hospital Events: Including procedures, antibiotic start and stop dates in addition to other pertinent events   1/4: admit to hospital 1/7: admit to ICU, maroon colored bowel movement, initiated on vasopressors and blood products administered 1/8: hemoglobin stable, off nor-epinephrine, required BiPAP 1/9: on BiPAP this AM, will trial off to nasal cannula  Interim History / Subjective:   Awake, interactive, but sleepy. Denies pain. Unable to elaborate on history  Objective   Blood pressure (!) 119/59, pulse 100, temperature 98.7 F (37.1 C), temperature source Axillary, resp. rate (!) 21, height 5' 6 (1.676 m), weight 72.6 kg, last menstrual period 02/10/2001, SpO2 100%.    FiO2 (%):  [50 %-100 %] 50 % PEEP:  [5 cmH20] 5 cmH20   Intake/Output Summary  (Last 24 hours) at 09/09/2023 0742 Last data filed at 09/09/2023 0600 Gross per 24 hour  Intake 424.19 ml  Output 1360 ml  Net -935.81 ml   Filed Weights   09/04/23 0919  Weight: 72.6 kg    Examination: Physical Exam Constitutional:      General: She is not in acute distress.    Appearance: She is ill-appearing.  Cardiovascular:     Rate and Rhythm: Normal rate and regular rhythm.     Pulses: Normal pulses.     Heart sounds: Normal heart sounds.  Pulmonary:     Effort: Pulmonary effort is normal.     Breath sounds: Normal breath sounds.     Comments: On BiPAP Abdominal:     Palpations: Abdomen is soft.  Neurological:     Mental Status: She is disoriented.     Motor: Weakness present.     Comments: Follows commands, opens her eyes      Assessment & Plan:   Neurology #Toxic Metabolic Encephalopathy #History of CVA #Seizure Disorder   Presents with altered mental status to the hospital, and remains with AMS on admission to the ICU. Suspect toxic metabolic encephalopathy is secondary to her shock and decreased perfusion. CT head showed remote/old left MCA stroke, so recrudescence of previous symptoms are possible, as is a subclinical seizure.   -consider MRI of the brain   Cardiovascular #Hemorrhagic Shock   Initially presented with weakness and hypotension to the hospital and developed hemorrhagic shock following large melenotic stool secondary to upper GI bleed. No reported cardiac history. 4 peripheral IV's  in place, and the patient was started on IV fluids, blood products, and IV vasopressor support with improvement. Lactic acid remains elevated, but is down trending from prior. She is now off vasopressor support.   -Received IV fluids, PRBC, platelets, and FFP -TTE without wall motion abnormalitites   Pulmonary #Acute Hypoxic Respiratory Failure #Shortness of Breath #Metabolic Acidosis   Increased shortness of breath in the setting of shock with compensation  for lactic acidosis on presentation, which worsened yesterday following EGD. Initial chest CT did not show any infiltrates but repeat CXR yesterday showed a right sided infiltrate concerning for aspiration pneumonia. Given acidemia we considered intubation and mechanical ventilation, but she did stabilize/improve with BiPAP therapy. Repeat blood gas showed improvement and we will trial off NIPPV.  -frequent VBG's  -goal SpO2 > 92%   Gastrointestinal #GI Bleed #History of Achalasia   Has a history of achalasia and erosive esophagitis, but no history of prior GI bleed or cirrhosis. Developed melanotic stools with hemodynamic instability concerning for a brisk upper GI bleed. Initiated on high dose PPI, multiple peripheral IV's placed, and initiated on volume resuscitation. Underwent EGD yesterday showing multiple stomach ulcers explaining the presentation. Appreciate input from GI.   -space CBC to q12hours -maintain two large bore IV's -high dose PPI -antibiotics per ID section   Renal #AKI #Lactic Acidosis   Presents with AKI, improved following volume resuscitation on presentation. Kidney function stabilized with resuscitation. Given AKI and metabolic acidosis will initiate sodium bicarbonate .   -continue to monitor kidney function and electrolytes -avoiding nephrotoxins -accurate I/O's   Endocrine   ICU glycemic protocol.   Hem/Onc #GI Bleed   Volume resuscitation with IV fluids, PRBC, FFP, and platelets. Resuscitated, enoxaparin  discontinued. Hemoglobin has been stable on repeat   -CBC q12hours -D/C DVT prophylaxis   ID   On broad spectrum antibiotics for prophylaxis given upper GI bleed. Did develop a right sided infiltrate on repeat CXR yesterday, with antibiotics broadened to vanc and zosyn . MRSA screen negative, will d/c vancomycin . Blood cultures remain no growth to day.   -continue Zosyn   MSK #T3 Compression fracture   Pain control with tylenol  and  oxycodone   Best Practice (right click and Reselect all SmartList Selections daily)   Diet/type: NPO DVT prophylaxis not indicated Pressure ulcer(s): N/A GI prophylaxis: PPI Lines: N/A Foley:  Yes, and it is still needed Code Status:  full code Last date of multidisciplinary goals of care discussion [09/09/2023]  Labs   CBC: Recent Labs  Lab 09/07/23 2243 09/08/23 0533 09/08/23 1027 09/08/23 1529 09/08/23 2141  WBC 20.8* 19.5* 19.7* 21.8* 21.5*  NEUTROABS 19.4* 17.8* 18.2* 20.8* 20.7*  HGB 11.3* 11.3* 11.9* 12.6 12.0  HCT 35.3* 33.5* 34.5* 38.0 35.2*  MCV 98.9 93.6 91.8 95.5 93.4  PLT 108* 91* 85* 74* 63*    Basic Metabolic Panel: Recent Labs  Lab 09/06/23 0640 09/07/23 0440 09/07/23 2243 09/08/23 0533 09/08/23 1628 09/08/23 1953  NA 146* 152* 150* 152* 151* 152*  K 3.9 2.9* 3.2* 3.2* 3.1* 3.5  CL 113* 117* 119* 119* 118* 120*  CO2 22 14* 7* 12* 16* 11*  GLUCOSE 86 130* 156* 110* 106* 79  BUN 78* 76* 81* 79* 78* 80*  CREATININE 1.81* 1.82* 2.14* 2.02* 1.84* 1.85*  CALCIUM 8.4* 8.0* 8.2* 8.1* 7.4* 7.6*  MG 2.4  --   --  1.7  --   --   PHOS  --   --   --  1.9*  --   --  GFR: Estimated Creatinine Clearance: 31.7 mL/min (A) (by C-G formula based on SCr of 1.85 mg/dL (H)). Recent Labs  Lab 09/04/23 1017 09/04/23 1041 09/07/23 2243 09/08/23 0533 09/08/23 1027 09/08/23 1529 09/08/23 2141  PROCALCITON 1.84  --   --  5.06  --   --   --   WBC  --    < > 20.8* 19.5* 19.7* 21.8* 21.5*  LATICACIDVEN  --    < > >9.0* 8.9* 8.6* 6.5*  --    < > = values in this interval not displayed.    Liver Function Tests: Recent Labs  Lab 09/04/23 1041 09/05/23 0451 09/07/23 0440 09/08/23 0533  AST 33 24 35 43*  ALT 23 18 24 26   ALKPHOS 77 49 75 101  BILITOT 1.0 1.1 1.0 1.0  PROT 7.0 5.4* 4.5* 4.5*  ALBUMIN  3.2* 2.5* 1.9* 2.5*   No results for input(s): LIPASE, AMYLASE in the last 168 hours. No results for input(s): AMMONIA in the last 168 hours.  ABG     Component Value Date/Time   PHART 7.35 09/08/2023 1929   PCO2ART 28 (L) 09/08/2023 1929   PO2ART 134 (H) 09/08/2023 1929   HCO3 15.5 (L) 09/08/2023 1929   ACIDBASEDEF 8.6 (H) 09/08/2023 1929   O2SAT 100 09/08/2023 1929     Coagulation Profile: Recent Labs  Lab 09/04/23 1041  INR 1.2    Cardiac Enzymes: Recent Labs  Lab 09/04/23 1041  CKTOTAL 60    HbA1C: Hgb A1c MFr Bld  Date/Time Value Ref Range Status  09/04/2023 10:41 AM 6.0 (H) 4.8 - 5.6 % Final    Comment:    (NOTE)         Prediabetes: 5.7 - 6.4         Diabetes: >6.4         Glycemic control for adults with diabetes: <7.0   04/19/2012 12:00 AM 5.8 4.0 - 6.0 % Final    CBG: Recent Labs  Lab 09/08/23 1916 09/09/23 0004 09/09/23 0047 09/09/23 0404 09/09/23 0734  GLUCAP 78 66* 114* 76 77    Past Medical History:  She,  has a past medical history of Kidney stones and Stroke (HCC).   Surgical History:   Past Surgical History:  Procedure Laterality Date   ABDOMINAL HYSTERECTOMY  2003   Total; Polycystic ovarian Disease   BIOPSY  01/17/2020   Procedure: BIOPSY;  Surgeon: Shila Gustav GAILS, MD;  Location: [facility];  Service: Endoscopy;;   ESOPHAGOGASTRODUODENOSCOPY (EGD) WITH PROPOFOL  N/A 12/14/2019   Procedure: ESOPHAGOGASTRODUODENOSCOPY (EGD) WITH PROPOFOL ;  Surgeon: Therisa Bi, MD;  Location: [facility] ENDOSCOPY;  Service: Gastroenterology;  Laterality: N/A;   ESOPHAGOGASTRODUODENOSCOPY (EGD) WITH PROPOFOL  N/A 01/17/2020   Procedure: ESOPHAGOGASTRODUODENOSCOPY (EGD) WITH PROPOFOL ;  Surgeon: Shila Gustav GAILS, MD;  Location: [facility] ENDOSCOPY;  Service: Endoscopy;  Laterality: N/A;  Gastric Lavage   FOREIGN BODY REMOVAL  01/17/2020   Procedure: FOREIGN BODY REMOVAL;  Surgeon: Shila Gustav GAILS, MD;  Location: [facility];  Service: Endoscopy;;  Food impaction in Esophagus     Social History:   reports that she has been smoking cigarettes. She has never used smokeless tobacco. She reports current  alcohol use. She reports that she does not use drugs.   Family History:  Her family history includes Alcohol abuse in her father and maternal grandfather; Bone cancer in her father; Breast cancer in her mother; Heart attack in her paternal grandfather; Hypertension in her father and paternal grandmother; Ovarian cancer in her maternal grandmother; Raynaud  syndrome in her mother; Skin cancer in her paternal grandmother and sister; Stroke in her mother and paternal grandfather.   Allergies No Known Allergies   Home Medications  Prior to Admission medications   Medication Sig Start Date End Date Taking? Authorizing Provider  amoxicillin -clavulanate (AUGMENTIN ) 875-125 MG tablet Take 1 tablet by mouth 2 (two) times daily. Patient not taking: Reported on 09/04/2023 03/03/21   Viviann Pastor, MD  benzonatate  (TESSALON ) 100 MG capsule Take 1 capsule (100 mg total) by mouth 2 (two) times daily as needed for cough. Patient not taking: Reported on 03/21/2021 01/21/21   Nedra Tinnie LABOR, NP  naproxen  (NAPROSYN ) 500 MG tablet Take 1 tablet (500 mg total) by mouth 2 (two) times daily with a meal. Patient not taking: Reported on 09/04/2023 03/03/21   Viviann Pastor, MD  predniSONE  (DELTASONE ) 10 MG tablet Take 6 tablets today, then 5 tablets tomorrow, then decrease by 1 tablet every day until gone Patient not taking: Reported on 09/04/2023 01/21/21   Nedra Tinnie LABOR, NP     Critical care time: 35 minutes    Belva November, MD [facility] 09/09/2023 8:30 AM

## 2023-09-09 NOTE — Procedures (Signed)
 Patient Name: Kaitlyn Hodges  MRN: [id number]  Epilepsy Attending: Arlin MALVA Krebs  Referring Physician/Provider: Maranda Lonell MATSU, NP  Date: 09/09/2023 Duration: 36.29 mins  Patient history: 63yo F with Left MCA stroks and ams getting eeg to evaluate for seizure  Level of alertness: Awake  AEDs during EEG study: None  Technical aspects: This EEG study was done with scalp electrodes positioned according to the 10-20 International system of electrode placement. Electrical activity was reviewed with band pass filter of 1-70Hz , sensitivity of 7 uV/mm, display speed of 54mm/sec with a 60Hz  notched filter applied as appropriate. EEG data were recorded continuously and digitally stored.  Video monitoring was available and reviewed as appropriate.  Description: EEG showed continuous generalized polymorphic 3 to 6 Hz theta-delta slowing. Hyperventilation and photic stimulation were not performed.     ABNORMALITY - Continuous slow, generalized  IMPRESSION: This study is suggestive of moderate diffuse encephalopathy. No seizures or epileptiform discharges were seen throughout the recording.  Keneth Borg O Brihany Butch

## 2023-09-09 NOTE — Plan of Care (Signed)
  Problem: Fluid Volume: Goal: Hemodynamic stability will improve Outcome: Not Progressing   Problem: Clinical Measurements: Goal: Diagnostic test results will improve Outcome: Not Progressing Goal: Signs and symptoms of infection will decrease Outcome: Not Progressing   Problem: Respiratory: Goal: Ability to maintain adequate ventilation will improve Outcome: Not Progressing   Problem: Education: Goal: Knowledge of General Education information will improve Description: Including pain rating scale, medication(s)/side effects and non-pharmacologic comfort measures Outcome: Not Progressing   Problem: Health Behavior/Discharge Planning: Goal: Ability to manage health-related needs will improve Outcome: Not Progressing   Problem: Clinical Measurements: Goal: Ability to maintain clinical measurements within normal limits will improve Outcome: Not Progressing Goal: Will remain free from infection Outcome: Not Progressing Goal: Diagnostic test results will improve Outcome: Not Progressing Goal: Respiratory complications will improve Outcome: Not Progressing Goal: Cardiovascular complication will be avoided Outcome: Not Progressing   Problem: Activity: Goal: Risk for activity intolerance will decrease Outcome: Not Progressing   Problem: Nutrition: Goal: Adequate nutrition will be maintained Outcome: Not Progressing   Problem: Coping: Goal: Level of anxiety will decrease Outcome: Not Progressing   Problem: Elimination: Goal: Will not experience complications related to bowel motility Outcome: Not Progressing Goal: Will not experience complications related to urinary retention Outcome: Not Progressing   Problem: Pain Management: Goal: General experience of comfort will improve Outcome: Not Progressing   Problem: Safety: Goal: Ability to remain free from injury will improve Outcome: Not Progressing   Problem: Skin Integrity: Goal: Risk for impaired skin integrity  will decrease Outcome: Not Progressing

## 2023-09-10 DIAGNOSIS — K220 Achalasia of cardia: Secondary | ICD-10-CM | POA: Diagnosis not present

## 2023-09-10 DIAGNOSIS — D696 Thrombocytopenia, unspecified: Secondary | ICD-10-CM

## 2023-09-10 DIAGNOSIS — G9340 Encephalopathy, unspecified: Secondary | ICD-10-CM | POA: Diagnosis not present

## 2023-09-10 DIAGNOSIS — A419 Sepsis, unspecified organism: Secondary | ICD-10-CM | POA: Diagnosis not present

## 2023-09-10 DIAGNOSIS — N179 Acute kidney failure, unspecified: Secondary | ICD-10-CM | POA: Diagnosis not present

## 2023-09-10 LAB — BASIC METABOLIC PANEL
Anion gap: 11 (ref 5–15)
BUN: 76 mg/dL — ABNORMAL HIGH (ref 8–23)
CO2: 24 mmol/L (ref 22–32)
Calcium: 7.3 mg/dL — ABNORMAL LOW (ref 8.9–10.3)
Chloride: 116 mmol/L — ABNORMAL HIGH (ref 98–111)
Creatinine, Ser: 1.53 mg/dL — ABNORMAL HIGH (ref 0.44–1.00)
GFR, Estimated: 38 mL/min — ABNORMAL LOW (ref >60)
Glucose, Bld: 179 mg/dL — ABNORMAL HIGH (ref 70–99)
Potassium: 3.1 mmol/L — ABNORMAL LOW (ref 3.5–5.1)
Sodium: 151 mmol/L — ABNORMAL HIGH (ref 135–145)

## 2023-09-10 LAB — GLUCOSE, CAPILLARY
Glucose-Capillary: 163 mg/dL — ABNORMAL HIGH (ref 70–99)
Glucose-Capillary: 191 mg/dL — ABNORMAL HIGH (ref 70–99)
Glucose-Capillary: 202 mg/dL — ABNORMAL HIGH (ref 70–99)
Glucose-Capillary: 259 mg/dL — ABNORMAL HIGH (ref 70–99)
Glucose-Capillary: 276 mg/dL — ABNORMAL HIGH (ref 70–99)

## 2023-09-10 LAB — MAGNESIUM: Magnesium: 1.8 mg/dL (ref 1.7–2.4)

## 2023-09-10 LAB — PHOSPHORUS: Phosphorus: 1.1 mg/dL — ABNORMAL LOW (ref 2.5–4.6)

## 2023-09-10 MED ORDER — INSULIN ASPART 100 UNIT/ML IJ SOLN
0.0000 [IU] | Freq: Four times a day (QID) | INTRAMUSCULAR | Status: DC
  Administered 2023-09-10: 5 [IU] via SUBCUTANEOUS
  Administered 2023-09-11 (×4): 2 [IU] via SUBCUTANEOUS
  Administered 2023-09-11 – 2023-09-13 (×4): 3 [IU] via SUBCUTANEOUS
  Administered 2023-09-13: 1 [IU] via SUBCUTANEOUS
  Administered 2023-09-13: 2 [IU] via SUBCUTANEOUS
  Administered 2023-09-13: 1 [IU] via SUBCUTANEOUS
  Administered 2023-09-14: 2 [IU] via SUBCUTANEOUS
  Administered 2023-09-14: 3 [IU] via SUBCUTANEOUS
  Administered 2023-09-14: 2 [IU] via SUBCUTANEOUS
  Administered 2023-09-14: 3 [IU] via SUBCUTANEOUS
  Administered 2023-09-15 (×2): 2 [IU] via SUBCUTANEOUS
  Administered 2023-09-16: 3 [IU] via SUBCUTANEOUS
  Administered 2023-09-17: 2 [IU] via SUBCUTANEOUS
  Administered 2023-09-17 – 2023-09-18 (×4): 3 [IU] via SUBCUTANEOUS
  Administered 2023-09-18: 2 [IU] via SUBCUTANEOUS
  Administered 2023-09-18 (×3): 3 [IU] via SUBCUTANEOUS
  Administered 2023-09-19: 1 [IU] via SUBCUTANEOUS
  Administered 2023-09-19 (×2): 3 [IU] via SUBCUTANEOUS
  Administered 2023-09-19: 2 [IU] via SUBCUTANEOUS
  Administered 2023-09-20: 3 [IU] via SUBCUTANEOUS
  Administered 2023-09-20: 2 [IU] via SUBCUTANEOUS
  Administered 2023-09-20: 1 [IU] via SUBCUTANEOUS
  Administered 2023-09-21: 2 [IU] via SUBCUTANEOUS
  Administered 2023-09-22: 1 [IU] via SUBCUTANEOUS
  Administered 2023-09-22 (×2): 2 [IU] via SUBCUTANEOUS
  Administered 2023-09-23 (×2): 1 [IU] via SUBCUTANEOUS
  Administered 2023-09-23: 2 [IU] via SUBCUTANEOUS
  Administered 2023-09-24 – 2023-09-27 (×8): 1 [IU] via SUBCUTANEOUS
  Administered 2023-09-28: 2 [IU] via SUBCUTANEOUS
  Administered 2023-09-29 – 2023-09-30 (×3): 1 [IU] via SUBCUTANEOUS
  Filled 2023-09-10 (×54): qty 1

## 2023-09-10 MED ORDER — POTASSIUM PHOSPHATES 15 MMOLE/5ML IV SOLN
30.0000 mmol | Freq: Once | INTRAVENOUS | Status: AC
  Administered 2023-09-10: 30 mmol via INTRAVENOUS
  Filled 2023-09-10: qty 10

## 2023-09-10 MED ORDER — FREE WATER
200.0000 mL | Status: DC
  Administered 2023-09-10 – 2023-09-13 (×34): 200 mL

## 2023-09-10 NOTE — Progress Notes (Addendum)
 Progress Note   Patient: Kaitlyn Hodges FMW:[id number] DOB: 01/13/1961 DOA: 09/04/2023     6 DOS: the patient was seen and examined on 09/10/2023   Brief hospital course: Kaitlyn Hodges is a 63 y.o. female with medical history significant of prediabetes, CVA, seizure disorder, type I achalasia,tobacco use disorder, erosive esophagitis who presents to the ED due to altered mental status.  Patient's daughter noted patient had dyspnea and cough for a month prior to admission and for the last week she had N/V and poor oral intake.  Patient was afebrile, hypotensive and tachycardic on vitals. She had a mild acidosis with increased AG. Noted lactate of 7.7. WBC of 32.9. BUN/cr 114/4.61. COVID, flu, RSV negative. Pan scan notable for possible L parotiditis, central airway thickening and patchy airspace opacities in the bases, age indeterminate T3 fracture.  Patient was started on broad spectrum antibiotics and fluid resuscitated.   Assessment and Plan: Severe sepsis Community-acquired pneumonia Patient presenting with hypotension, tachycardia, marked leukocytosis and lactic acidosis.  Patient's white count, lactic acid still elevated. Respiratory panel-negative Continue Zosyn  therapy. Follow-up on culture results. Lactic acidosis- better today. unknown explanation for its elevation.   Hemorrhagic shock secondary to acute GI bleed Gastroenterologist on board, status post EGD on 09/08/2023, showed, multiple stomach ulcers. Continue to monitor CBC closely Patient received blood transfusion on [address]  now.   AKI (acute kidney injury) (HCC) 2/2 hypovolemia and severe sepsis. In the setting of poor p.o. intake, severe uncontrolled achalasia Continue gentle IV hydration. Monitor renal function closely. Avoid nephrotoxic drugs.   Hypokalemia Hypophosphatemia- continue repletion and monitoring. Pharmacy to replete electrolytes. Potassium 3.1 today.  Continue to replace with IV k phos   supplementation.  Hypernatremia Sodium stable around 150. Continue gentle fluids with D5W.  Thrombocytopenia: In the setting of sepsis. No active bleeding. Continue to monitor platelet count.   Acute encephalopathy In the setting of sepsis, hypovolemia, hemorrhagic shock, electrolyte abnormalities. Mental status still not baseline. MRI brain done today Dural thickening versus thin subdural collection overlying the left cerebral hemisphere, measuring up to 3-4 mm in thickness. Not significant per discussion of PCCM with neurosurgery, EEG showed moderate diffuse encephalopathy. No seizures or epileptiform discharges. Continue nursing supportive care. Neurochecks. Delirium precautions. Continue aspiration precaution   Achalasia Patient follows up with GI outpatient for dilation.   Pre-diabetes Blood sugars higher side as she is on D5 with bicarb drip. Accucheks with q6hr sliding scale ordered. Continue tube feeds. Hypoglycemia protocol.   Closed wedge compression fracture of T3 vertebra (HCC) Incidental finding of a T3 compression fracture on imaging.  Patient's daughter reports a fall 2 weeks ago but states her mom did not complain of any pain afterwards.  Given mild nature (less than 10%) and age-indeterminate, will hold off on neurosurgery consultation. Continue pain management   History of CVA (cerebrovascular accident) Per chart review, patient has a history of CVA dating back to 2010.  CT of the head obtained showed chronic infarcts scattered along the left frontoparietal convexity in the left basal ganglia, as well as lacunar infarcts in the right thalamus.  No acute findings.  Does not appear patient is on statin or aspirin. This can be considered when patient is more stable   Seizure disorder [facility]) Per chart review, there is a reported history of seizure disorder that began after CVA in 2010.  No reported seizures since that time. EEG showed moderate diffuse  encephalopathy. No seizures or epileptiform discharges. Continue seizure precautions.  Nutrition Documentation  Flowsheet Row ED to Hosp-Admission (Current) from 09/04/2023 in [facility] ICU/CCU  Nutrition Problem Moderate Malnutrition  Etiology chronic illness  Nutrition Goal Patient will meet greater than or equal to 90% of their needs     ,  Active Pressure Injury/Wound(s)     Pressure Ulcer  Duration          Pressure Injury 09/06/23 Buttocks Medial Stage 2 -  Partial thickness loss of dermis presenting as a shallow open injury with a red, pink wound bed without slough. small stage 2, skin breakdown at top of gluteal cleft 2 days           Out of bed to chair. Incentive spirometry. Nursing supportive care. Fall, aspiration precautions. DVT prophylaxis   Code Status: Full Code  Subjective: Patient is seen and examined today. She is sleepy and lethargic, opens eyes but sleeps easily. No family at bedside. Q2 sat 95% on 2 L. Discussed with ICU attending.  Her lactic acid better today.    Physical Exam: Vitals:   09/10/23 1300 09/10/23 1400 09/10/23 1500 09/10/23 1600  BP: 101/60 (!) 98/59 95/62 95/60   Pulse: 100 (!) 101 (!) 102 (!) 107  Resp: (!) 21 (!) 23 (!) 26 (!) 25  Temp:      TempSrc:      SpO2: 95% 97% 96% 96%  Weight:      Height:        General - Elderly ill looking Caucasian female, lethargic HEENT - PERRLA, EOMI, atraumatic head, non tender sinuses. Lung - Clear, diffuse rales, rhonchi, no wheezes. Heart - S1, S2 heard, no murmurs, rubs, trace pedal edema. Abdomen - Soft, non tender, nondistended, bowel sounds good Neuro -sleep, arousable, unable to do full neuroexam Skin - Warm and dry.  Data Reviewed:      Latest Ref Rng & Units 09/09/2023    3:45 PM 09/09/2023   10:53 AM 09/09/2023    7:24 AM  CBC  WBC 4.0 - 10.5 K/uL 23.4  22.4  22.8   Hemoglobin 12.0 - 15.0 g/dL 85.8  87.1  86.5   Hematocrit 36.0 - 46.0 % 40.9  36.5   39.6   Platelets 150 - 400 K/uL 53  53  58       Latest Ref Rng & Units 09/10/2023    6:08 AM 09/09/2023   10:12 PM 09/09/2023    6:30 PM  BMP  Glucose 70 - 99 mg/dL 820     BUN 8 - 23 mg/dL 76     Creatinine 9.55 - 1.00 mg/dL 8.46     Sodium 864 - 854 mmol/L 151     Potassium 3.5 - 5.1 mmol/L 3.1  3.4  3.5   Chloride 98 - 111 mmol/L 116     CO2 22 - 32 mmol/L 24     Calcium 8.9 - 10.3 mg/dL 7.3      DG Abd 1 View Result Date: 09/09/2023 CLINICAL DATA:  NG tube placement EXAM: ABDOMEN - 1 VIEW COMPARISON:  09/09/2023 FINDINGS: NG tube tip is in the stomach. Nonobstructive bowel gas pattern. Right basilar opacity, likely atelectasis. IMPRESSION: NG tube tip in the stomach. Electronically Signed   By: Franky Crease M.D.   On: 09/09/2023 19:53   DG Abd 1 View Result Date: 09/09/2023 CLINICAL DATA:  Check gastric catheter placement EXAM: ABDOMEN - 1 VIEW COMPARISON:  None Available. FINDINGS: Gastric catheter is noted within the stomach. Proximal side port is at the gastroesophageal junction.  This could be advanced deeper into the stomach. IMPRESSION: Gastric catheter as described. This should be advanced deeper into the stomach. Electronically Signed   By: Oneil Devonshire M.D.   On: 09/09/2023 18:31   EEG adult Result Date: 09/09/2023 Shelton Arlin KIDD, MD     09/09/2023  4:39 PM Patient Name: Kaitlyn Hodges MRN: [id number] Epilepsy Attending: Arlin KIDD Shelton Referring Physician/Provider: Maranda Lonell MATSU, NP Date: 09/09/2023 Duration: 36.29 mins Patient history: 63yo F with Left MCA stroks and ams getting eeg to evaluate for seizure Level of alertness: Awake AEDs during EEG study: None Technical aspects: This EEG study was done with scalp electrodes positioned according to the 10-20 International system of electrode placement. Electrical activity was reviewed with band pass filter of 1-70Hz , sensitivity of 7 uV/mm, display speed of 72mm/sec with a 60Hz  notched filter applied as appropriate. EEG data were recorded  continuously and digitally stored.  Video monitoring was available and reviewed as appropriate. Description: EEG showed continuous generalized polymorphic 3 to 6 Hz theta-delta slowing. Hyperventilation and photic stimulation were not performed.   ABNORMALITY - Continuous slow, generalized IMPRESSION: This study is suggestive of moderate diffuse encephalopathy. No seizures or epileptiform discharges were seen throughout the recording. Arlin KIDD Shelton   MR BRAIN W CONTRAST Result Date: 09/09/2023 CLINICAL DATA:  Altered mental status. Abnormal MRI. Possible subdural collection. EXAM: MRI HEAD WITH CONTRAST TECHNIQUE: Multiplanar, multiecho pulse sequences of the brain and surrounding structures were obtained with intravenous contrast. CONTRAST:  7mL GADAVIST  GADOBUTROL  1 MMOL/ML IV SOLN COMPARISON:  MR head without contrast 09/09/2023 FINDINGS: Brain: This study is again moderately degraded by patient motion. Remote left MCA territory infarcts are present. Mild dural thickening and enhancement is present over the temporal and posterior frontal lobes bilaterally. No significant extra-axial collections are present. No parenchymal enhancement is present. Vascular: Normal vascular enhancement present. Skull and upper cervical spine: The craniocervical junction is normal. Upper cervical spine is within normal limits. Marrow signal is unremarkable. Sinuses/Orbits: The paranasal sinuses and mastoid air cells are clear. Bilateral lens replacements are noted. Globes and orbits are otherwise unremarkable. IMPRESSION: 1. Mild dural thickening and enhancement over the temporal and posterior frontal lobes bilaterally. This is a nonspecific finding and may be related to recent surgery or trauma. No focal collection is present to suggest infection. 2. Remote left MCA territory infarcts. Electronically Signed   By: Lonni Necessary M.D.   On: 09/09/2023 13:58   MR BRAIN WO CONTRAST Addendum Date: 09/09/2023 ADDENDUM REPORT:  09/09/2023 12:34 ADDENDUM: Impression #3 called by telephone at the time of interpretation on 09/09/2023 at 12:16 pm to provider DANA NELSON , who verbally acknowledged these results. Electronically Signed   By: Rockey Childs D.O.   On: 09/09/2023 12:34   Result Date: 09/09/2023 CLINICAL DATA:  Provided history: Mental status change, unknown cause. EXAM: MRI HEAD WITHOUT CONTRAST TECHNIQUE: Multiplanar, multiecho pulse sequences of the brain and surrounding structures were obtained without intravenous contrast. COMPARISON:  Head CT 09/04/2023.  Brain MRI 05/02/2009. FINDINGS: Multiple sequences are significantly motion degraded. Most notably, the axial T2 sequence is severely motion degraded, the axial FLAIR sequence is severely motion degraded and the axial T1 sequence is severely motion degraded. Within this limitation, findings are as follows. Brain: Mild generalized cerebral atrophy. Chronic left MCA territory infarcts within the cortical/subcortical left frontal and parietal lobes, and within the left basal ganglia. Additional chronic left MCA territory cortical/subcortical infarct at the junction of the left temporal, parietal and occipital lobes.  Chronic hemosiderin deposition associated with some of these infarcts. Small chronic cortically-based infarct more posteriorly within the left parietal lobe (PCA territory). Chronic lacunar infarct within the right thalamus. Background mild patchy and ill-defined hypoattenuation within the cerebral white matter, nonspecific but compatible chronic small vessel ischemic disease. Dural thickening versus thin subdural collection overlying the left cerebral hemisphere, measuring up to 3-4 mm in thickness (for instance as seen on series 9, image 29). Dural thickening versus trace subdural collection overlying the right cerebral hemisphere (measuring up to 2 mm in thickness (for instance as seen on series 9, image 29 There is no acute infarct. Small focus of  diffusion-weighted signal hyperintensity within the right frontal lobe centrum semiovale (with corresponding T2 shine through on the ADC map) (series 5, image 35). No evidence of an intracranial mass. No midline shift. Vascular: Within the limitations of significant motion degradation, no definite loss of proximal arterial flow voids. Skull and upper cervical spine: Within limitations of motion degradation, no focal worrisome marrow lesion. Incompletely assessed cervical spondylosis. Sinuses/Orbits: Within limitations of motion degradation, no acute orbital finding. 16 mm mucous retention cyst within the right maxillary sinus. Attempts are being made to reach the ordering provider at this time regarding impression #3 IMPRESSION: 1. Significantly motion degraded examination, limiting evaluation. 2. The diffusion-weighted imaging is of good quality and there is no evidence of an acute infarct. 3. Dural thickening versus subdural collections/possible subdural hematomas overlying the bilateral cerebral hemispheres, more prominent on the left and measuring up to 4 mm in thickness. No significant mass effect. 4. Parenchymal atrophy, chronic small vessel ischemic disease and chronic infarcts, as outlined within the body of the report. 5. 16 mm right maxillary sinus mucous retention cyst. Electronically Signed: By: Rockey Childs D.O. On: 09/09/2023 12:11    Disposition: Status is: Inpatient for severe electrolyte abnormalities, very high white count, high lactic acid Remains inpatient appropriate because: Stepdown unit  Planned Discharge Destination: Skilled nursing facility     MDM level 3-patient is sick presented with altered mentation, hemorrhagic shock, respiratory failure, GI bleed.  Patient's lactic acid still greater than 8, her kidney function is not baseline, potassium low.  She is at high risk for sudden clinical deterioration.  Author: Concepcion Riser, MD 09/10/2023 4:49 PM Secure chat 7am to  7pm For on call review [email or web address].

## 2023-09-10 NOTE — Plan of Care (Signed)
  Problem: Fluid Volume: Goal: Hemodynamic stability will improve Outcome: Progressing   Problem: Fluid Volume: Goal: Hemodynamic stability will improve Outcome: Progressing   Problem: Clinical Measurements: Goal: Signs and symptoms of infection will decrease Outcome: Progressing   Problem: Clinical Measurements: Goal: Signs and symptoms of infection will decrease Outcome: Progressing   Problem: Education: Goal: Knowledge of General Education information will improve Description: Including pain rating scale, medication(s)/side effects and non-pharmacologic comfort measures Outcome: Progressing   Problem: Education: Goal: Knowledge of General Education information will improve Description: Including pain rating scale, medication(s)/side effects and non-pharmacologic comfort measures Outcome: Progressing   Problem: Clinical Measurements: Goal: Diagnostic test results will improve Outcome: Not Progressing Goal: Respiratory complications will improve Outcome: Progressing   Problem: Clinical Measurements: Goal: Diagnostic test results will improve Outcome: Not Progressing   Problem: Clinical Measurements: Goal: Respiratory complications will improve Outcome: Progressing   Problem: Nutrition: Goal: Adequate nutrition will be maintained Outcome: Progressing   Problem: Nutrition: Goal: Adequate nutrition will be maintained Outcome: Progressing

## 2023-09-10 NOTE — Progress Notes (Signed)
 Dr. Clide Dales gave orders for patient to be transferred to stepdown level of care.

## 2023-09-10 NOTE — Progress Notes (Signed)
 PHARMACY CONSULT NOTE - FOLLOW UP  Pharmacy Consult for Electrolyte Monitoring and Replacement   Recent Labs: Potassium (mmol/L)  Date Value  09/10/2023 3.1 (L)   Magnesium  (mg/dL)  Date Value  98/89/7974 1.8   Calcium (mg/dL)  Date Value  98/89/7974 7.3 (L)   Albumin  (g/dL)  Date Value  98/91/7974 2.5 (L)   Phosphorus (mg/dL)  Date Value  98/89/7974 1.1 (L)   Sodium (mmol/L)  Date Value  09/10/2023 151 (H)  04/19/2012 142     Assessment: 63 y.o. female with medical history significant of prediabetes, CVA, seizure disorder, type I achalasia,tobacco use disorder, erosive esophagitis who presents to the ED due to altered mental status. Pharmacy is asked to follow and replace electrolytes while in CCU  MIVF: sodium bicarbonate  150 mEq in dextrose  5 at 75 mL/hr  Nutrition: Osmolite at 35 mL/hr + FWF 150 mL/hr  Goal of Therapy:  Electrolytes WNL  Plan:  ---30 mmol IV potassium phosphate  x 1 (contains 44 mEq IV potassium) ---increase free water  flushes to 200 mL per tube every 2 hours ISO hypernatremia ---MIVF will stop at 24h default setting: MD aware ---recheck electrolytes in am  [name] JONETTA Bolster ,PharmD Clinical Pharmacist 09/10/2023 6:58 AM

## 2023-09-10 NOTE — Progress Notes (Signed)
   09/10/23 1315  Spiritual Encounters  Type of Visit Initial  Care provided to: Patient  Conversation partners present during encounter Nurse  Referral source Chaplain assessment  Reason for visit Routine spiritual support  OnCall Visit No  Intervention Outcomes  Outcomes Connection to spiritual care;Awareness of support  Spiritual Care Plan  Spiritual Care Issues Still Outstanding No further spiritual care needs at this time (see row info)   Introduced chaplain services; pt. Non-verbal; no family support present

## 2023-09-11 DIAGNOSIS — D696 Thrombocytopenia, unspecified: Secondary | ICD-10-CM | POA: Diagnosis present

## 2023-09-11 DIAGNOSIS — A419 Sepsis, unspecified organism: Secondary | ICD-10-CM | POA: Diagnosis not present

## 2023-09-11 DIAGNOSIS — R0600 Dyspnea, unspecified: Secondary | ICD-10-CM

## 2023-09-11 DIAGNOSIS — N179 Acute kidney failure, unspecified: Secondary | ICD-10-CM | POA: Diagnosis not present

## 2023-09-11 LAB — TYPE AND SCREEN
ABO/RH(D): A POS
Antibody Screen: NEGATIVE
Unit division: 0
Unit division: 0
Unit division: 0
Unit division: 0

## 2023-09-11 LAB — HEPATIC FUNCTION PANEL
ALT: 16 U/L (ref 0–44)
AST: 17 U/L (ref 15–41)
Albumin: 1.8 g/dL — ABNORMAL LOW (ref 3.5–5.0)
Alkaline Phosphatase: 158 U/L — ABNORMAL HIGH (ref 38–126)
Bilirubin, Direct: 0.3 mg/dL — ABNORMAL HIGH (ref 0.0–0.2)
Indirect Bilirubin: 0.8 mg/dL (ref 0.3–0.9)
Total Bilirubin: 1.1 mg/dL (ref 0.0–1.2)
Total Protein: 4.1 g/dL — ABNORMAL LOW (ref 6.5–8.1)

## 2023-09-11 LAB — FIBRINOGEN: Fibrinogen: 618 mg/dL — ABNORMAL HIGH (ref 210–475)

## 2023-09-11 LAB — CBC
HCT: 31.8 % — ABNORMAL LOW (ref 36.0–46.0)
Hemoglobin: 11.1 g/dL — ABNORMAL LOW (ref 12.0–15.0)
MCH: 31.2 pg (ref 26.0–34.0)
MCHC: 34.9 g/dL (ref 30.0–36.0)
MCV: 89.3 fL (ref 80.0–100.0)
Platelets: 25 10*3/uL — CL (ref 150–400)
RBC: 3.56 MIL/uL — ABNORMAL LOW (ref 3.87–5.11)
RDW: 16.8 % — ABNORMAL HIGH (ref 11.5–15.5)
WBC: 14.9 10*3/uL — ABNORMAL HIGH (ref 4.0–10.5)
nRBC: 0 % (ref 0.0–0.2)

## 2023-09-11 LAB — BASIC METABOLIC PANEL
Anion gap: 7 (ref 5–15)
BUN: 81 mg/dL — ABNORMAL HIGH (ref 8–23)
CO2: 29 mmol/L (ref 22–32)
Calcium: 7.2 mg/dL — ABNORMAL LOW (ref 8.9–10.3)
Chloride: 113 mmol/L — ABNORMAL HIGH (ref 98–111)
Creatinine, Ser: 1.37 mg/dL — ABNORMAL HIGH (ref 0.44–1.00)
GFR, Estimated: 43 mL/min — ABNORMAL LOW (ref >60)
Glucose, Bld: 200 mg/dL — ABNORMAL HIGH (ref 70–99)
Potassium: 3.2 mmol/L — ABNORMAL LOW (ref 3.5–5.1)
Sodium: 149 mmol/L — ABNORMAL HIGH (ref 135–145)

## 2023-09-11 LAB — TECHNOLOGIST SMEAR REVIEW: Plt Morphology: DECREASED

## 2023-09-11 LAB — BPAM RBC
Blood Product Expiration Date: 202501302359
Blood Product Expiration Date: 202501302359
Blood Product Expiration Date: 202501302359
Blood Product Expiration Date: 202501302359
ISSUE DATE / TIME: 202501071254
ISSUE DATE / TIME: 202501071543
ISSUE DATE / TIME: 202501071948
Unit Type and Rh: 6200
Unit Type and Rh: 6200
Unit Type and Rh: 6200
Unit Type and Rh: 6200

## 2023-09-11 LAB — GLUCOSE, CAPILLARY
Glucose-Capillary: 160 mg/dL — ABNORMAL HIGH (ref 70–99)
Glucose-Capillary: 189 mg/dL — ABNORMAL HIGH (ref 70–99)
Glucose-Capillary: 195 mg/dL — ABNORMAL HIGH (ref 70–99)
Glucose-Capillary: 199 mg/dL — ABNORMAL HIGH (ref 70–99)
Glucose-Capillary: 221 mg/dL — ABNORMAL HIGH (ref 70–99)

## 2023-09-11 LAB — APTT: aPTT: 28 s (ref 24–36)

## 2023-09-11 LAB — PROTIME-INR
INR: 1.4 — ABNORMAL HIGH (ref 0.8–1.2)
Prothrombin Time: 17.1 s — ABNORMAL HIGH (ref 11.4–15.2)

## 2023-09-11 LAB — PHOSPHORUS: Phosphorus: 1.6 mg/dL — ABNORMAL LOW (ref 2.5–4.6)

## 2023-09-11 LAB — FOLATE: Folate: 12.1 ng/mL (ref >5.9)

## 2023-09-11 LAB — VITAMIN B12: Vitamin B-12: 172 pg/mL — ABNORMAL LOW (ref 180–914)

## 2023-09-11 LAB — LACTATE DEHYDROGENASE: LDH: 236 U/L — ABNORMAL HIGH (ref 98–192)

## 2023-09-11 LAB — MAGNESIUM: Magnesium: 1.7 mg/dL (ref 1.7–2.4)

## 2023-09-11 MED ORDER — POTASSIUM CHLORIDE 20 MEQ PO PACK
20.0000 meq | PACK | Freq: Once | ORAL | Status: AC
  Administered 2023-09-11: 20 meq
  Filled 2023-09-11: qty 1

## 2023-09-11 MED ORDER — MAGNESIUM SULFATE 2 GM/50ML IV SOLN
2.0000 g | Freq: Once | INTRAVENOUS | Status: AC
  Administered 2023-09-11: 2 g via INTRAVENOUS
  Filled 2023-09-11: qty 50

## 2023-09-11 MED ORDER — POTASSIUM PHOSPHATES 15 MMOLE/5ML IV SOLN
45.0000 mmol | Freq: Once | INTRAVENOUS | Status: AC
  Administered 2023-09-11: 45 mmol via INTRAVENOUS
  Filled 2023-09-11: qty 15

## 2023-09-11 MED ORDER — SODIUM CHLORIDE 0.9 % IV SOLN
1.0000 g | Freq: Two times a day (BID) | INTRAVENOUS | Status: DC
  Administered 2023-09-11 – 2023-09-12 (×2): 1 g via INTRAVENOUS
  Filled 2023-09-11 (×3): qty 10

## 2023-09-11 NOTE — Progress Notes (Signed)
 Progress Note   Patient: Kaitlyn Hodges FMW:[id number] DOB: 12-24-1960 DOA: 09/04/2023     7 DOS: the patient was seen and examined on 09/11/2023   Brief hospital course: Kaitlyn Hodges is a 63 y.o. female with medical history significant of prediabetes, CVA, seizure disorder, type I achalasia,tobacco use disorder, erosive esophagitis who presents to the ED due to altered mental status.  Patient's daughter noted patient had dyspnea and cough for a month prior to admission and for the last week she had N/V and poor oral intake.  Patient was afebrile, hypotensive and tachycardic on vitals. She had a mild acidosis with increased AG. Noted lactate of 7.7. WBC of 32.9. BUN/cr 114/4.61. COVID, flu, RSV negative. Pan scan notable for possible L parotiditis, central airway thickening and patchy airspace opacities in the bases, age indeterminate T3 fracture.  Patient was started on broad spectrum antibiotics and fluid resuscitated.   Assessment and Plan: Severe sepsis Community-acquired pneumonia Patient presenting with hypotension, tachycardia, marked leukocytosis and lactic acidosis.  Patient's white count, lactic acid trended down to 2.8 Respiratory panel-negative. Off Levophed  now. Continue Zosyn  therapy. Follow-up on culture results.   Hemorrhagic shock secondary to acute GI bleed Gastroenterologist on board, status post EGD on 09/08/2023, showed tortuous esophagus, diffuse inflammation in the esophagus and the small bowel which is not amenable to any endoscopic procedure. Continue to monitor CBC closely. Given her low platelets she is at high risk for bleeding. Patient received blood transfusion on 09/07/23. Continue NG feeds. H/o Achalasia -Patient will need to follow up with up with GI outpatient   AKI (acute kidney injury) (HCC) 2/2 hypovolemia and severe sepsis. In the setting of poor p.o. intake, severe uncontrolled achalasia Continue gentle IV hydration, NG feeds, free water . Monitor renal  function closely. Avoid nephrotoxic drugs.   Hypokalemia Hypophosphatemia- continue repletion and monitoring. Pharmacy to replete electrolytes. Potassium 3.2 today.  Continue to replace with IV k phos  supplementation.  Hypernatremia Sodium stable around 150. Continue gentle fluids with D5W.  Thrombocytopenia: In the setting of severe sepsis. No active bleeding. Platelet count dropped to 25 today. Last dose of Lovenox  for DVT ppx on 09/06/23 Hematology consult placed for further evaluation. Continue to monitor platelet count.   Acute metabolic encephalopathy In the setting of sepsis, hypovolemia, hemorrhagic shock, electrolyte abnormalities. Mental status still not baseline. MRI brain done 1/9 showed dural thickening versus thin subdural collection overlying the left cerebral hemisphere, measuring up to 3-4 mm in thickness. Neurosurgery reviewed imaging no surgical indication. EEG showed moderate diffuse encephalopathy. No seizures or epileptiform discharges. Continue nursing supportive care. Neurochecks. Delirium precautions. Continue aspiration precaution   Pre-diabetes A1c 6.0. As she is on D5 with bicarb drip continue Accucheks with q6hr sliding scale Continue tube feeds. Hypoglycemia protocol.   Closed wedge compression fracture of T3 vertebra (HCC) Incidental finding of a T3 compression fracture on imaging.  Patient's daughter reports a fall 2 weeks ago but states her mom did not complain of any pain afterwards.  Given mild nature (less than 10%) and age-indeterminate, will hold off on neurosurgery consultation. Continue pain management   History of CVA (cerebrovascular accident) Per chart review, patient has a history of CVA dating back to 2010.  CT of the head obtained showed chronic infarcts scattered along the left frontoparietal convexity in the left basal ganglia, as well as lacunar infarcts in the right thalamus.  No acute findings.  Does not appear patient is on  statin or aspirin. This can be considered when patient is  more stable   Seizure disorder [facility]) Per chart review, there is a reported history of seizure disorder that began after CVA in 2010.  No reported seizures since that time. EEG showed moderate diffuse encephalopathy. No seizures or epileptiform discharges. Continue seizure precautions.  Nutrition Documentation    Flowsheet Row ED to Hosp-Admission (Current) from [facility]04/20[facility]U  Nutrition Problem Moderate Malnutrition  Etiology chronic illness  Nutrition Goal Patient will meet greater than or equal to 90% of their needs     ,  Active Pressure Injury/Wound(s)     Pressure Ulcer  Duration          Pressure Injury 09/06/23 Buttocks Medial Stage 2 -  Partial thickness loss of dermis presenting as a shallow open injury with a red, pink wound bed without slough. small stage 2, skin breakdown at top of gluteal cleft 2 days           Nursing supportive care. Fall, aspiration precautions. DVT prophylaxis SCD   Code Status: Full Code  Subjective: Patient is seen and examined today. She is lethargic, opens eyes but sleeps easily. No family at bedside. She is on2 L supplemental o2 HR, BP improved. Platelet count dropped to 25.  Physical Exam: Vitals:   09/11/23 0800 09/11/23 0900 09/11/23 1000 09/11/23 1100  BP: 104/61 106/63 (!) 105/59 106/70  Pulse: 90 94 90 97  Resp: 18 (!) 23 17 (!) 23  Temp:      TempSrc:      SpO2: 96% 94% 95% 94%  Weight:      Height:        General - Elderly ill looking Caucasian female, lethargic HEENT - PERRLA, EOMI, atraumatic head, non tender sinuses. Lung - Clear, diffuse rales, rhonchi, no wheezes. Heart - S1, S2 heard, no murmurs, rubs, trace pedal edema. Abdomen - Soft, non tender, nondistended, bowel sounds good Neuro -sleep, arousable, unable to do full neuroexam Skin - Warm and dry.  Data Reviewed:      Latest Ref Rng & Units 09/11/2023     2:13 AM 09/09/2023    3:45 PM 09/09/2023   10:53 AM  CBC  WBC 4.0 - 10.5 K/uL 14.9  23.4  22.4   Hemoglobin 12.0 - 15.0 g/dL 88.8  85.8  87.1   Hematocrit 36.0 - 46.0 % 31.8  40.9  36.5   Platelets 150 - 400 K/uL 25  53  53       Latest Ref Rng & Units 09/11/2023    2:13 AM 09/10/2023    6:08 AM 09/09/2023   10:12 PM  BMP  Glucose 70 - 99 mg/dL 799  820    BUN 8 - 23 mg/dL 81  76    Creatinine 9.55 - 1.00 mg/dL 8.62  8.46    Sodium 864 - 145 mmol/L 149  151    Potassium 3.5 - 5.1 mmol/L 3.2  3.1  3.4   Chloride 98 - 111 mmol/L 113  116    CO2 22 - 32 mmol/L 29  24    Calcium 8.9 - 10.3 mg/dL 7.2  7.3     DG Abd 1 View Result Date: 09/09/2023 CLINICAL DATA:  NG tube placement EXAM: ABDOMEN - 1 VIEW COMPARISON:  09/09/2023 FINDINGS: NG tube tip is in the stomach. Nonobstructive bowel gas pattern. Right basilar opacity, likely atelectasis. IMPRESSION: NG tube tip in the stomach. Electronically Signed   By: Franky Crease M.D.   On: 09/09/2023 19:53  DG Abd 1 View Result Date: 09/09/2023 CLINICAL DATA:  Check gastric catheter placement EXAM: ABDOMEN - 1 VIEW COMPARISON:  None Available. FINDINGS: Gastric catheter is noted within the stomach. Proximal side port is at the gastroesophageal junction. This could be advanced deeper into the stomach. IMPRESSION: Gastric catheter as described. This should be advanced deeper into the stomach. Electronically Signed   By: Oneil Devonshire M.D.   On: 09/09/2023 18:31   EEG adult Result Date: 09/09/2023 Shelton Arlin KIDD, MD     09/09/2023  4:39 PM Patient Name: Yareni Creps MRN: [id number] Epilepsy Attending: Arlin KIDD Shelton Referring Physician/Provider: Maranda Lonell MATSU, NP Date: 09/09/2023 Duration: 36.29 mins Patient history: 63yo F with Left MCA stroks and ams getting eeg to evaluate for seizure Level of alertness: Awake AEDs during EEG study: None Technical aspects: This EEG study was done with scalp electrodes positioned according to the 10-20 International system of  electrode placement. Electrical activity was reviewed with band pass filter of 1-70Hz , sensitivity of 7 uV/mm, display speed of 82mm/sec with a 60Hz  notched filter applied as appropriate. EEG data were recorded continuously and digitally stored.  Video monitoring was available and reviewed as appropriate. Description: EEG showed continuous generalized polymorphic 3 to 6 Hz theta-delta slowing. Hyperventilation and photic stimulation were not performed.   ABNORMALITY - Continuous slow, generalized IMPRESSION: This study is suggestive of moderate diffuse encephalopathy. No seizures or epileptiform discharges were seen throughout the recording. Arlin KIDD Shelton   MR BRAIN W CONTRAST Result Date: 09/09/2023 CLINICAL DATA:  Altered mental status. Abnormal MRI. Possible subdural collection. EXAM: MRI HEAD WITH CONTRAST TECHNIQUE: Multiplanar, multiecho pulse sequences of the brain and surrounding structures were obtained with intravenous contrast. CONTRAST:  7mL GADAVIST  GADOBUTROL  1 MMOL/ML IV SOLN COMPARISON:  MR head without contrast 09/09/2023 FINDINGS: Brain: This study is again moderately degraded by patient motion. Remote left MCA territory infarcts are present. Mild dural thickening and enhancement is present over the temporal and posterior frontal lobes bilaterally. No significant extra-axial collections are present. No parenchymal enhancement is present. Vascular: Normal vascular enhancement present. Skull and upper cervical spine: The craniocervical junction is normal. Upper cervical spine is within normal limits. Marrow signal is unremarkable. Sinuses/Orbits: The paranasal sinuses and mastoid air cells are clear. Bilateral lens replacements are noted. Globes and orbits are otherwise unremarkable. IMPRESSION: 1. Mild dural thickening and enhancement over the temporal and posterior frontal lobes bilaterally. This is a nonspecific finding and may be related to recent surgery or trauma. No focal collection is  present to suggest infection. 2. Remote left MCA territory infarcts. Electronically Signed   By: Lonni Necessary M.D.   On: 09/09/2023 13:58   Family Discussion- Discussed with son over phone regarding her current care, he understands, all questions answered.  Disposition: Status is: Inpatient for severe electrolyte abnormalities, very high white count, high lactic acid Remains inpatient appropriate because: Stepdown unit  Planned Discharge Destination: Skilled nursing facility     MDM level 3-patient is sick presented with altered mentation, hemorrhagic shock, respiratory failure, GI bleed.  Patient's platelet count dropped, her mental status, kidney function is not baseline, potassium, phos low.  She is at high risk for sudden clinical deterioration.  Author: Concepcion Riser, MD 09/11/2023 12:03 PM Secure chat 7am to 7pm For on call review [email or web address].

## 2023-09-11 NOTE — Progress Notes (Signed)
 PHARMACY CONSULT NOTE - FOLLOW UP  Pharmacy Consult for Electrolyte Monitoring and Replacement   Recent Labs: Potassium (mmol/L)  Date Value  09/11/2023 3.2 (L)   Magnesium  (mg/dL)  Date Value  98/88/7974 1.7   Calcium (mg/dL)  Date Value  98/88/7974 7.2 (L)   Albumin  (g/dL)  Date Value  98/88/7974 1.8 (L)   Phosphorus (mg/dL)  Date Value  98/88/7974 1.6 (L)   Sodium (mmol/L)  Date Value  09/11/2023 149 (H)  04/19/2012 142     Assessment: 62 y.o. female with medical history significant of prediabetes, CVA, seizure disorder, type I achalasia,tobacco use disorder, erosive esophagitis who presents to the ED due to altered mental status. Pharmacy is asked to follow and replace electrolytes while in CCU  MIVF: sodium bicarbonate  150 mEq in dextrose  5 at 75 mL/hr + free water  flushes 200 ml q2H  Nutrition: Osmolite at 35 mL/hr + FWF 150 mL/hr  Goal of Therapy:  Electrolytes WNL  Plan:  Kphos IV 45 mmol IV x 1 + Kcl 20 mEq x 1 in the PM Mg 2 g IV x 1  F/u with AM labs.   Cathaleen GORMAN Blanch ,PharmD Clinical Pharmacist 09/11/2023 8:33 AM

## 2023-09-11 NOTE — Progress Notes (Signed)
 Date and time results received: 09/11/23 1744  Pt had 60 beat run of non sustain V tach.   Name of Provider Notified: Dr. Clide Dales

## 2023-09-11 NOTE — Plan of Care (Signed)
  Problem: Respiratory: Goal: Ability to maintain adequate ventilation will improve Outcome: Progressing   Problem: Clinical Measurements: Goal: Ability to maintain clinical measurements within normal limits will improve Outcome: Progressing Goal: Respiratory complications will improve Outcome: Progressing   Problem: Nutrition: Goal: Adequate nutrition will be maintained Outcome: Progressing   Problem: Elimination: Goal: Will not experience complications related to bowel motility Outcome: Progressing Goal: Will not experience complications related to urinary retention Outcome: Progressing

## 2023-09-12 ENCOUNTER — Inpatient Hospital Stay: Payer: Medicaid Other

## 2023-09-12 DIAGNOSIS — R6520 Severe sepsis without septic shock: Secondary | ICD-10-CM | POA: Diagnosis not present

## 2023-09-12 DIAGNOSIS — A419 Sepsis, unspecified organism: Secondary | ICD-10-CM | POA: Diagnosis not present

## 2023-09-12 DIAGNOSIS — N179 Acute kidney failure, unspecified: Secondary | ICD-10-CM | POA: Diagnosis not present

## 2023-09-12 DIAGNOSIS — D696 Thrombocytopenia, unspecified: Secondary | ICD-10-CM | POA: Diagnosis not present

## 2023-09-12 LAB — RETICULOCYTES
Immature Retic Fract: 8.4 % (ref 2.3–15.9)
RBC.: 3.65 MIL/uL — ABNORMAL LOW (ref 3.87–5.11)
Retic Count, Absolute: 12.8 10*3/uL — ABNORMAL LOW (ref 19.0–186.0)
Retic Ct Pct: <0.4 % — ABNORMAL LOW (ref 0.4–3.1)

## 2023-09-12 LAB — BASIC METABOLIC PANEL
Anion gap: 12 (ref 5–15)
BUN: 75 mg/dL — ABNORMAL HIGH (ref 8–23)
CO2: 29 mmol/L (ref 22–32)
Calcium: 7.1 mg/dL — ABNORMAL LOW (ref 8.9–10.3)
Chloride: 102 mmol/L (ref 98–111)
Creatinine, Ser: 1.3 mg/dL — ABNORMAL HIGH (ref 0.44–1.00)
GFR, Estimated: 46 mL/min — ABNORMAL LOW (ref >60)
Glucose, Bld: 237 mg/dL — ABNORMAL HIGH (ref 70–99)
Potassium: 3.8 mmol/L (ref 3.5–5.1)
Sodium: 143 mmol/L (ref 135–145)

## 2023-09-12 LAB — CBC WITH DIFFERENTIAL/PLATELET
Abs Immature Granulocytes: 0.1 10*3/uL — ABNORMAL HIGH (ref 0.00–0.07)
Basophils Absolute: 0 10*3/uL (ref 0.0–0.1)
Basophils Relative: 0 %
Eosinophils Absolute: 0.1 10*3/uL (ref 0.0–0.5)
Eosinophils Relative: 2 %
HCT: 32.1 % — ABNORMAL LOW (ref 36.0–46.0)
Hemoglobin: 10.6 g/dL — ABNORMAL LOW (ref 12.0–15.0)
Immature Granulocytes: 2 %
Lymphocytes Relative: 9 %
Lymphs Abs: 0.5 10*3/uL — ABNORMAL LOW (ref 0.7–4.0)
MCH: 30.5 pg (ref 26.0–34.0)
MCHC: 33 g/dL (ref 30.0–36.0)
MCV: 92.2 fL (ref 80.0–100.0)
Monocytes Absolute: 0.2 10*3/uL (ref 0.1–1.0)
Monocytes Relative: 3 %
Neutro Abs: 5.2 10*3/uL (ref 1.7–7.7)
Neutrophils Relative %: 84 %
Platelets: 44 10*3/uL — ABNORMAL LOW (ref 150–400)
RBC: 3.48 MIL/uL — ABNORMAL LOW (ref 3.87–5.11)
RDW: 16.3 % — ABNORMAL HIGH (ref 11.5–15.5)
WBC: 6.1 10*3/uL (ref 4.0–10.5)
nRBC: 0.3 % — ABNORMAL HIGH (ref 0.0–0.2)

## 2023-09-12 LAB — GLUCOSE, CAPILLARY
Glucose-Capillary: 212 mg/dL — ABNORMAL HIGH (ref 70–99)
Glucose-Capillary: 203 mg/dL — ABNORMAL HIGH (ref 70–99)
Glucose-Capillary: 120 mg/dL — ABNORMAL HIGH (ref 70–99)

## 2023-09-12 LAB — PHOSPHORUS: Phosphorus: 2.7 mg/dL (ref 2.5–4.6)

## 2023-09-12 LAB — CBC
HCT: 30 % — ABNORMAL LOW (ref 36.0–46.0)
Hemoglobin: 10.2 g/dL — ABNORMAL LOW (ref 12.0–15.0)
MCH: 31.2 pg (ref 26.0–34.0)
MCHC: 34 g/dL (ref 30.0–36.0)
MCV: 91.7 fL (ref 80.0–100.0)
Platelets: 15 10*3/uL — CL (ref 150–400)
RBC: 3.27 MIL/uL — ABNORMAL LOW (ref 3.87–5.11)
RDW: 16.2 % — ABNORMAL HIGH (ref 11.5–15.5)
WBC: 6.6 10*3/uL (ref 4.0–10.5)
nRBC: 0 % (ref 0.0–0.2)

## 2023-09-12 LAB — MAGNESIUM: Magnesium: 2.1 mg/dL (ref 1.7–2.4)

## 2023-09-12 LAB — D-DIMER, QUANTITATIVE: D-Dimer, Quant: 6.87 ug{FEU}/mL — ABNORMAL HIGH (ref 0.00–0.50)

## 2023-09-12 MED ORDER — ZINC OXIDE 40 % EX OINT
TOPICAL_OINTMENT | CUTANEOUS | Status: DC | PRN
  Filled 2023-09-12 (×3): qty 113

## 2023-09-12 MED ORDER — SODIUM CHLORIDE 0.9 % IV SOLN
2.0000 g | Freq: Two times a day (BID) | INTRAVENOUS | Status: AC
  Administered 2023-09-12 – 2023-09-14 (×4): 2 g via INTRAVENOUS
  Filled 2023-09-12 (×4): qty 12.5

## 2023-09-12 MED ORDER — SODIUM CHLORIDE 0.9% IV SOLUTION: Freq: Once | INTRAVENOUS | Status: AC

## 2023-09-12 MED ORDER — CYANOCOBALAMIN 1000 MCG/ML IJ SOLN
1000.0000 ug | Freq: Every day | INTRAMUSCULAR | Status: AC
  Administered 2023-09-12 – 2023-09-16 (×5): 1000 ug via INTRAMUSCULAR
  Filled 2023-09-12 (×5): qty 1

## 2023-09-12 NOTE — Progress Notes (Signed)
 Progress Note   Patient: Kaitlyn Hodges FMW:[id number] DOB: 1960/10/02 DOA: 09/04/2023     8 DOS: the patient was seen and examined on 09/12/2023   Brief hospital course: Kaitlyn Hodges is a 63 y.o. female with medical history significant of prediabetes, CVA, seizure disorder, type I achalasia,tobacco use disorder, erosive esophagitis who presents to the ED due to altered mental status.  Patient's daughter noted patient had dyspnea and cough for a month prior to admission and for the last week she had N/V and poor oral intake.  Patient was afebrile, hypotensive and tachycardic on vitals. She had a mild acidosis with increased AG. Noted lactate of 7.7. WBC of 32.9. BUN/cr 114/4.61. COVID, flu, RSV negative. Pan scan notable for possible L parotiditis, central airway thickening and patchy airspace opacities in the bases, age indeterminate T3 fracture.  Patient was started on broad spectrum antibiotics and fluid resuscitated.   Assessment and Plan: Severe sepsis Community-acquired pneumonia. Acute hypoxic respiratory failure. Patient presenting with hypotension, tachycardia, marked leukocytosis and lactic acidosis.  Patient's white count, lactic acid trended down to 2.8 Respiratory panel-negative. MRSA PCR negative. Off Bipap and Levophed  now. Zosyn  changed to cefepime  due to possible DITP. Urine cultures insignificant growth. Blood cultures so far negative. Pulmonary toilet, out of bed to chair, incentive spirometry.   Hemorrhagic shock secondary to acute GI bleed S/p EGD on 09/08/2023, showed tortuous esophagus, diffuse inflammation in the esophagus and the small bowel which is not amenable to any endoscopic procedure. GI evaluation appreciated. Continue to monitor H/H closely. Given her low platelets she is at high risk for bleeding. Hb today 10.2. Patient received blood transfusion on 09/07/23. Continue NG feeds. H/o Achalasia -Patient will need to follow up with up with GI outpatient   AKI (acute  kidney injury) (HCC) 2/2 hypovolemia and severe sepsis. In the setting of poor p.o. intake, severe uncontrolled achalasia Continue NG feeds, free water . Stop IV fluids. Monitor renal function closely. Avoid nephrotoxic drugs.   Hypokalemia Hypophosphatemia- Improved. Pharmacy on board. Continue to replace electrolytes as needed.  Hypernatremia Sodium improved to 143. Trend Na. Stop IV fluids. Continue free water .  Thrombocytopenia: Hematology consult appreciated. 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 Zosyn  changed to cefepime . Suspicion for TTP, DIC is low. Possible DITP from zosyn . No active bleeding. Platelet count dropped to 15 today. 2 units of platelet transfusion ordered. Continue to monitor platelet count.   Acute metabolic encephalopathy In the setting of sepsis, hypovolemia, hemorrhagic shock, electrolyte abnormalities. Mental status still not baseline. MRI brain done 1/9 showed dural thickening versus thin subdural collection overlying the left cerebral hemisphere, measuring up to 3-4 mm in thickness. Neurosurgery reviewed imaging no surgical indication. EEG showed moderate diffuse encephalopathy. No seizures or epileptiform discharges. Continue nursing supportive care. Neurochecks. Delirium precautions. Continue aspiration precaution   Pre-diabetes A1c 6.0. Accucheks with q6hr sliding scale, continue tube feeds. Hypoglycemia protocol.   Closed wedge compression fracture of T3 vertebra (HCC) Incidental finding of a T3 compression fracture on imaging.  She had fall 2 weeks ago. Continue pain management   History of CVA (cerebrovascular accident) Per chart review, patient has a history of CVA dating back to 2010.  CT of the head obtained showed chronic infarcts scattered along the left frontoparietal convexity in the left basal ganglia, as well as lacunar infarcts in the right thalamus. No acute findings. Will consider aspirin, statin once stable.    Seizure disorder (HCC) History of seizure disorder since CVA in 2010.  No reported seizures since that time. EEG showed moderate diffuse encephalopathy. No seizures or epileptiform discharges. Continue seizure precautions.  Nutrition Documentation    Flowsheet Row ED to Hosp-Admission (Current) from 09/04/2023 in [facility]CCU  Nutrition Problem Moderate Malnutrition  Etiology chronic illness  Nutrition Goal Patient will meet greater than or equal to 90% of their needs     ,  Active Pressure Injury/Wound(s)     Pressure Ulcer  Duration          Pressure Injury 09/06/23 Buttocks Medial Stage 2 -  Partial thickness loss of dermis presenting as a shallow open injury with a red, pink wound bed without slough. small stage 2, skin breakdown at top of gluteal cleft 2 days           Nursing supportive care. Fall, aspiration precautions. DVT prophylaxis SCD   Code Status: Full Code  Subjective: Patient is seen and examined today. She is more alert, able to mouth her name, very weak. No family at bedside. She is on 2-6 L supplemental o2. HR, BP stable. Platelet count dropped to 15. 2 units transfused.  Physical Exam: Vitals:   09/12/23 1000 09/12/23 1015 09/12/23 1056 09/12/23 1100  BP: (!) 93/59 (!) 93/58 103/64 97/60  Pulse: 88 86 82 86  Resp: 19 20 17 18   Temp:  98.6 F (37 C)    TempSrc:  Axillary    SpO2: 93% 95% 92% 93%  Weight:      Height:        General - Elderly ill looking Caucasian female, no apparent distress HEENT - PERRLA, EOMI, atraumatic head, non tender sinuses. Lung - Clear, diffuse rhonchi, no wheezes. Heart - S1, S2 heard, no murmurs, rubs, trace pedal edema. Abdomen - Soft, non tender, nondistended, bowel sounds good Neuro - Alert, able to follow simple commands, non focal exam Skin - Warm and dry.  Data Reviewed:      Latest Ref Rng & Units 09/12/2023    4:29 AM 09/11/2023    2:13 AM 09/09/2023    3:45 PM  CBC  WBC 4.0  - 10.5 K/uL 6.6  14.9  23.4   Hemoglobin 12.0 - 15.0 g/dL 89.7  88.8  85.8   Hematocrit 36.0 - 46.0 % 30.0  31.8  40.9   Platelets 150 - 400 K/uL 15  25  53       Latest Ref Rng & Units 09/12/2023    4:29 AM 09/11/2023    2:13 AM 09/10/2023    6:08 AM  BMP  Glucose 70 - 99 mg/dL 762  799  820   BUN 8 - 23 mg/dL 75  81  76   Creatinine 0.44 - 1.00 mg/dL 8.69  8.62  8.46   Sodium 135 - 145 mmol/L 143  149  151   Potassium 3.5 - 5.1 mmol/L 3.8  3.2  3.1   Chloride 98 - 111 mmol/L 102  113  116   CO2 22 - 32 mmol/L 29  29  24    Calcium 8.9 - 10.3 mg/dL 7.1  7.2  7.3    No results found.  Family Discussion- Discussed with son over phone regarding her current care, he understands, all questions answered.  Disposition: Status is: Inpatient for severe electrolyte abnormalities, very high white count, high lactic acid Remains inpatient appropriate because: Stepdown unit  Planned Discharge Destination: Skilled nursing facility     MDM level 3-patient is sick presented with altered mentation, hemorrhagic  shock, respiratory failure, GI bleed.  Patient's platelet count dropped, her mental status, kidney function is not baseline, hypoxia noted.  She is at high risk for sudden clinical deterioration.  Author: Concepcion Riser, MD 09/12/2023 11:26 AM Secure chat 7am to 7pm For on call review [email or web address].

## 2023-09-12 NOTE — Progress Notes (Signed)
 PHARMACY NOTE:  ANTIMICROBIAL RENAL DOSAGE ADJUSTMENT  Current antimicrobial regimen includes a mismatch between antimicrobial dosage and estimated renal function.  As per policy approved by the Pharmacy & Therapeutics and Medical Executive Committees, the antimicrobial dosage will be adjusted accordingly.  Current antimicrobial dosage: Cefepime  1 g IV q12h  Indication: PNA  Renal Function:  Estimated Creatinine Clearance: 48.3 mL/min (A) (by C-G formula based on SCr of 1.3 mg/dL (H)).    Antimicrobial dosage has been changed to:  Cefepime  2 g IV q12h   Thank you for allowing pharmacy to be a part of this patient's care.  Marolyn KATHEE Mare, [facility] 09/12/2023 12:55 PM

## 2023-09-12 NOTE — Progress Notes (Signed)
 Pt having continuous liquid stools with skin breakdown and pain.Verbal order for rectal tube insertion.

## 2023-09-12 NOTE — Consult Note (Addendum)
 [facility]  [phone] [phone]) [phone]  ID: Harland Reels OB: Dec 21, 1960  MR#: [id number]  RDW#:[id number]  Patient Care Team: Gasper Nancyann BRAVO, MD as PCP - General (Family Medicine) Therisa Bi, MD as Consulting Physician (Gastroenterology)  REFERRING PROVIDER: Dr. Darci  REASON FOR REFERRAL: thrombocytopenia  ASSESSMENT AND PLAN:   Nieve Rojero is a 63 y.o. female with pmh of prediabetes, CVA, seizure disorder, achalasia, erosive esophagitis presented to ED on 09/04/2023 with altered mental status.  # Severe sepsis # Community-acquired pneumonia # Thrombocytopenia # AKI # GI bleed   - Platelets prior to admission was normal.  On admission, platelets were 133 and then progressively trended down.  On 09/11/2023 platelets of 25.  Today it is 15.  Hemoglobin on admission was 14 which needed at 7.6 with the GI bleed.  After blood transfusion has been trending between 10-12.  Fibrinogen  617.  17.1 and 1.4.  aPTT 28.  LDH 236.  Vitamin B12 172.  Folate 12.1.  HIT panel is pending.  Haptoglobin, reticulocyte count is pending.  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  -DIC panel is negative.  Suspicion for TTP is low. T. Bili is normal. LDH borderline elevated which is non specific. Haptoglobin and retic pending. On smear, rare schistocytes which can be seen with sepsis.  - HIT panel pending. Last Lovenox  exposure 09/06/23. - Switched zosyn  to cefepime  on 1/11. With DITP, platelets can nadir to <20k.  - Vitamin b12 low 174. Consider b12 inj daily x 5. - Plts today 15K. With recent hx of GI bleeding and with some downtrend in Hb, proceed with platelet transfusion. Plt check 30 mins post transfusion to assess for response.    Will continue to follow the patient.  Thank you for the consult.    Patient expressed understanding and was in agreement with this plan. She also understands that She  can call clinic at any time with any questions, concerns, or complaints.   I spent a total of 60 minutes reviewing chart data, face-to-face evaluation with the patient, counseling and coordination of care as detailed above.  Genevia Rous, MD   09/12/2023 11:00 AM  HPI: Shenekia Riess is a 63 y.o. female with past medical history of prediabetes, CVA, seizure disorder, achalasia, erosive esophagitis presented to ED on 09/04/2023 with altered mental status.  On admission, patient was found to be hypotensive and tachycardic with lactic acidosis.  WBC was elevated at 32.9.  COVID, respiratory panel was negative.  CT chest notable for left parotitis, central airway thickening patchy airspace opacities in the bases concerning for pneumonia.  Patient was started on Zosyn .  She required Levophed  and ICU admission.  Hospital course was complicated by acute GI bleed and hemorrhagic shock.  Status post EGD on 09/08/2023 showed tortuous esophagus, diffuse inflammation esophagus and small bowel.  Also developed AKI which is improving with IV hydration.  Hematology was consulted for thrombocytopenia.  Platelets prior to admission was normal.  On admission, platelets were 133 and then progressively trended down.  On 09/11/2023 platelets of 25.  Today it is 15.  Hemoglobin on admission was 14 which needed at 7.6 with the GI bleed.  After blood transfusion has been trending between 10-12.  Fibrinogen  617.  17.1 and 1.4.  aPTT 28.  LDH 236.  Vitamin B12 172.  Folate 12.1.  HIT panel is pending.  Haptoglobin, reticulocyte count is pending.    REVIEW OF SYSTEMS:   ROS  As per HPI. Otherwise, a complete review of systems is negative.  PAST MEDICAL HISTORY: Past Medical History:  Diagnosis Date   Kidney stones    Stroke [facility])     PAST SURGICAL HISTORY: Past Surgical History:  Procedure Laterality Date   ABDOMINAL HYSTERECTOMY  2003   Total; Polycystic ovarian Disease   BIOPSY  01/17/2020   Procedure: BIOPSY;   Surgeon: Shila Gustav GAILS, MD;  Location: [facility] ENDOSCOPY;  Service: Endoscopy;;   ESOPHAGOGASTRODUODENOSCOPY N/A 09/08/2023   Procedure: ESOPHAGOGASTRODUODENOSCOPY (EGD);  Surgeon: Jinny Carmine, MD;  Location: [facility] ENDOSCOPY;  Service: Endoscopy;  Laterality: N/A;   ESOPHAGOGASTRODUODENOSCOPY (EGD) WITH PROPOFOL  N/A 12/14/2019   Procedure: ESOPHAGOGASTRODUODENOSCOPY (EGD) WITH PROPOFOL ;  Surgeon: Therisa Bi, MD;  Location[facility] ENDOSCOPY;  Service: Gastroenterology;  Laterality: N/A;   ESOPHAGOGASTRODUODENOSCOPY (EGD) WITH PROPOFOL  N/A 01/17/2020   Procedure: ESOPHAGOGASTRODUODENOSCOPY (EGD) WITH PROPOFOL ;  Surgeon: Shila Gustav GAILS, MD;  Location: [facility] ENDOSCOPY;  Service: Endoscopy;  Laterality: N/A;  Gastric Lavage   FOREIGN BODY REMOVAL  01/17/2020   Procedure: FOREIGN BODY REMOVAL;  Surgeon: Shila Gustav GAILS, MD;  Location: [facility] ENDOSCOPY;  Service: Endoscopy;;  Food impaction in Esophagus    FAMILY HISTORY: Family History  Problem Relation Age of Onset   Breast cancer Mother    Stroke Mother    Raynaud syndrome Mother    Hypertension Father    Bone cancer Father    Alcohol abuse Father    Skin cancer Sister    Ovarian cancer Maternal Grandmother    Alcohol abuse Maternal Grandfather    Skin cancer Paternal Grandmother    Hypertension Paternal Grandmother    Heart attack Paternal Grandfather    Stroke Paternal Grandfather     HEALTH MAINTENANCE: Social History   Tobacco Use   Smoking status: Every Day    Types: Cigarettes   Smokeless tobacco: Never   Tobacco comments:    pt smokes 1 pack of cigarettes very 2 to 3 days  Vaping Use   Vaping status: Never Used  Substance Use Topics   Alcohol use: Yes    Alcohol/week: 0.0 standard drinks of alcohol    Comment: occassional    Drug use: No     No Known Allergies  Current Facility-Administered Medications  Medication Dose Route Frequency Provider Last Rate Last Admin   acetaminophen  (TYLENOL ) tablet 650 mg  650 mg  Oral Q6H PRN Jinny Carmine, MD       Or   acetaminophen  (TYLENOL ) suppository 650 mg  650 mg Rectal Q6H PRN Jinny Carmine, MD   650 mg at 09/07/23 0045   ceFEPIme  (MAXIPIME ) 1 g in sodium chloride  0.9 % 100 mL IVPB  1 g Intravenous Q12H Darci Pore, MD 200 mL/hr at 09/12/23 0114 1 g at 09/12/23 0114   Chlorhexidine  Gluconate Cloth 2 % PADS 6 each  6 each Topical Daily Jinny Carmine, MD   6 each at 09/12/23 0818   feeding supplement (OSMOLITE 1.5 CAL) liquid 1,000 mL  1,000 mL Per Tube Continuous Sreeram, Narendranath, MD 55 mL/hr at 09/12/23 0400 1,000 mL at 09/12/23 0400   free water  200 mL  200 mL Per Tube Q2H Nada Doughty D, RPH   200 mL at 09/12/23 9043   insulin  aspart (novoLOG ) injection 0-9 Units  0-9 Units Subcutaneous Q6H Sreeram, Narendranath, MD   3 Units at 09/12/23 0545   liver oil-zinc  oxide (DESITIN) 40 % ointment   Topical PRN Jesus America, NP       nutrition supplement RAWLEIGH) (  JUVEN) powder packet 1 packet  1 packet Per Tube BID BM Sreeram, Narendranath, MD   1 packet at 09/12/23 1010   ondansetron  (ZOFRAN ) tablet 4 mg  4 mg Oral Q6H PRN Jinny Carmine, MD       Or   ondansetron  (ZOFRAN ) injection 4 mg  4 mg Intravenous Q6H PRN Jinny Carmine, MD       Oral care mouth rinse  15 mL Mouth Rinse 4 times per day Dorinda Homans T, MD   15 mL at 09/12/23 0800   Oral care mouth rinse  15 mL Mouth Rinse PRN Dorinda Homans DASEN, MD       pantoprazole  (PROTONIX ) injection 40 mg  40 mg Intravenous Q12H Jinny Carmine, MD   40 mg at 09/12/23 1010   sodium chloride  flush (NS) 0.9 % injection 3 mL  3 mL Intravenous Q12H Jinny Carmine, MD   3 mL at 09/12/23 0956   thiamine  (VITAMIN B1) injection 100 mg  100 mg Intravenous Daily Nada [name] BIRCH, RPH   100 mg at 09/12/23 1010    OBJECTIVE: Vitals:   09/12/23 1015 09/12/23 1056  BP: (!) 93/58 103/64  Pulse: 86 82  Resp: 20 17  Temp: 98.6 F (37 C)   SpO2: 95% 92%     Body mass index is 29.82 kg/m.      General: Well-developed,  well-nourished, no acute distress. Eyes: Pink conjunctiva, anicteric sclera. HEENT: Normocephalic, moist mucous membranes, clear oropharnyx. Lungs: Clear to auscultation bilaterally. Heart: Regular rate and rhythm. No rubs, murmurs, or gallops. Abdomen: Soft, nontender, nondistended. No organomegaly noted, normoactive bowel sounds. Musculoskeletal: No edema, cyanosis, or clubbing. Neuro: Alert, answering all questions appropriately. Cranial nerves grossly intact. Skin: No rashes or petechiae noted. Psych: Normal affect. Lymphatics: No cervical, calvicular, axillary or inguinal LAD.   LAB RESULTS:  Lab Results  Component Value Date   NA 143 09/12/2023   K 3.8 09/12/2023   CL 102 09/12/2023   CO2 29 09/12/2023   GLUCOSE 237 (H) 09/12/2023   BUN 75 (H) 09/12/2023   CREATININE 1.30 (H) 09/12/2023   CALCIUM 7.1 (L) 09/12/2023   PROT 4.1 (L) 09/11/2023   ALBUMIN  1.8 (L) 09/11/2023   AST 17 09/11/2023   ALT 16 09/11/2023   ALKPHOS 158 (H) 09/11/2023   BILITOT 1.1 09/11/2023   GFRNONAA 46 (L) 09/12/2023   GFRAA >60 11/04/2018    Lab Results  Component Value Date   WBC 6.6 09/12/2023   NEUTROABS 22.1 (H) 09/09/2023   HGB 10.2 (L) 09/12/2023   HCT 30.0 (L) 09/12/2023   MCV 91.7 09/12/2023   PLT 15 (LL) 09/12/2023    No results found for: TIBC, FERRITIN, IRONPCTSAT   STUDIES: DG Abd 1 View Result Date: 09/09/2023 CLINICAL DATA:  NG tube placement EXAM: ABDOMEN - 1 VIEW COMPARISON:  09/09/2023 FINDINGS: NG tube tip is in the stomach. Nonobstructive bowel gas pattern. Right basilar opacity, likely atelectasis. IMPRESSION: NG tube tip in the stomach. Electronically Signed   By: Franky Crease M.D.   On: 09/09/2023 19:53   DG Abd 1 View Result Date: 09/09/2023 CLINICAL DATA:  Check gastric catheter placement EXAM: ABDOMEN - 1 VIEW COMPARISON:  None Available. FINDINGS: Gastric catheter is noted within the stomach. Proximal side port is at the gastroesophageal junction. This  could be advanced deeper into the stomach. IMPRESSION: Gastric catheter as described. This should be advanced deeper into the stomach. Electronically Signed   By: Oneil Devonshire M.D.   On: 09/09/2023  18:31   EEG adult Result Date: 09/09/2023 Shelton Arlin KIDD, MD     09/09/2023  4:39 PM Patient Name: Regene Mccarthy MRN: [id number] Epilepsy Attending: Arlin KIDD Shelton Referring Physician/Provider: Maranda Lonell MATSU, NP Date: 09/09/2023 Duration: 36.29 mins Patient history: 63yo F with Left MCA stroks and ams getting eeg to evaluate for seizure Level of alertness: Awake AEDs during EEG study: None Technical aspects: This EEG study was done with scalp electrodes positioned according to the 10-20 International system of electrode placement. Electrical activity was reviewed with band pass filter of 1-70Hz , sensitivity of 7 uV/mm, display speed of 58mm/sec with a 60Hz  notched filter applied as appropriate. EEG data were recorded continuously and digitally stored.  Video monitoring was available and reviewed as appropriate. Description: EEG showed continuous generalized polymorphic 3 to 6 Hz theta-delta slowing. Hyperventilation and photic stimulation were not performed.   ABNORMALITY - Continuous slow, generalized IMPRESSION: This study is suggestive of moderate diffuse encephalopathy. No seizures or epileptiform discharges were seen throughout the recording. Arlin KIDD Shelton   MR BRAIN W CONTRAST Result Date: 09/09/2023 CLINICAL DATA:  Altered mental status. Abnormal MRI. Possible subdural collection. EXAM: MRI HEAD WITH CONTRAST TECHNIQUE: Multiplanar, multiecho pulse sequences of the brain and surrounding structures were obtained with intravenous contrast. CONTRAST:  7mL GADAVIST  GADOBUTROL  1 MMOL/ML IV SOLN COMPARISON:  MR head without contrast 09/09/2023 FINDINGS: Brain: This study is again moderately degraded by patient motion. Remote left MCA territory infarcts are present. Mild dural thickening and enhancement is present  over the temporal and posterior frontal lobes bilaterally. No significant extra-axial collections are present. No parenchymal enhancement is present. Vascular: Normal vascular enhancement present. Skull and upper cervical spine: The craniocervical junction is normal. Upper cervical spine is within normal limits. Marrow signal is unremarkable. Sinuses/Orbits: The paranasal sinuses and mastoid air cells are clear. Bilateral lens replacements are noted. Globes and orbits are otherwise unremarkable. IMPRESSION: 1. Mild dural thickening and enhancement over the temporal and posterior frontal lobes bilaterally. This is a nonspecific finding and may be related to recent surgery or trauma. No focal collection is present to suggest infection. 2. Remote left MCA territory infarcts. Electronically Signed   By: Lonni Necessary M.D.   On: 09/09/2023 13:58   MR BRAIN WO CONTRAST Addendum Date: 09/09/2023 ADDENDUM REPORT: 09/09/2023 12:34 ADDENDUM: Impression #3 called by telephone at the time of interpretation on 09/09/2023 at 12:16 pm to provider DANA NELSON , who verbally acknowledged these results. Electronically Signed   By: Rockey Childs D.O.   On: 09/09/2023 12:34   Result Date: 09/09/2023 CLINICAL DATA:  Provided history: Mental status change, unknown cause. EXAM: MRI HEAD WITHOUT CONTRAST TECHNIQUE: Multiplanar, multiecho pulse sequences of the brain and surrounding structures were obtained without intravenous contrast. COMPARISON:  Head CT 09/04/2023.  Brain MRI 05/02/2009. FINDINGS: Multiple sequences are significantly motion degraded. Most notably, the axial T2 sequence is severely motion degraded, the axial FLAIR sequence is severely motion degraded and the axial T1 sequence is severely motion degraded. Within this limitation, findings are as follows. Brain: Mild generalized cerebral atrophy. Chronic left MCA territory infarcts within the cortical/subcortical left frontal and parietal lobes, and within the left  basal ganglia. Additional chronic left MCA territory cortical/subcortical infarct at the junction of the left temporal, parietal and occipital lobes. Chronic hemosiderin deposition associated with some of these infarcts. Small chronic cortically-based infarct more posteriorly within the left parietal lobe (PCA territory). Chronic lacunar infarct within the right thalamus. Background mild patchy and ill-defined  hypoattenuation within the cerebral white matter, nonspecific but compatible chronic small vessel ischemic disease. Dural thickening versus thin subdural collection overlying the left cerebral hemisphere, measuring up to 3-4 mm in thickness (for instance as seen on series 9, image 29). Dural thickening versus trace subdural collection overlying the right cerebral hemisphere (measuring up to 2 mm in thickness (for instance as seen on series 9, image 29 There is no acute infarct. Small focus of diffusion-weighted signal hyperintensity within the right frontal lobe centrum semiovale (with corresponding T2 shine through on the ADC map) (series 5, image 35). No evidence of an intracranial mass. No midline shift. Vascular: Within the limitations of significant motion degradation, no definite loss of proximal arterial flow voids. Skull and upper cervical spine: Within limitations of motion degradation, no focal worrisome marrow lesion. Incompletely assessed cervical spondylosis. Sinuses/Orbits: Within limitations of motion degradation, no acute orbital finding. 16 mm mucous retention cyst within the right maxillary sinus. Attempts are being made to reach the ordering provider at this time regarding impression #3 IMPRESSION: 1. Significantly motion degraded examination, limiting evaluation. 2. The diffusion-weighted imaging is of good quality and there is no evidence of an acute infarct. 3. Dural thickening versus subdural collections/possible subdural hematomas overlying the bilateral cerebral hemispheres, more  prominent on the left and measuring up to 4 mm in thickness. No significant mass effect. 4. Parenchymal atrophy, chronic small vessel ischemic disease and chronic infarcts, as outlined within the body of the report. 5. 16 mm right maxillary sinus mucous retention cyst. Electronically Signed: By: Rockey Childs D.O. On: 09/09/2023 12:11   DG Chest Port 1 View Result Date: 09/08/2023 CLINICAL DATA:  Dyspnea EXAM: PORTABLE CHEST 1 VIEW COMPARISON:  09/07/2023 FINDINGS: Cardiac shadow is stable. Distended esophagus with air is noted over the right chest. Bibasilar infiltrative changes are noted right greater than left increased from the previous day. No bony abnormality is noted. IMPRESSION: Increasing right-sided infiltrate Electronically Signed   By: Oneil Devonshire M.D.   On: 09/08/2023 17:07   ECHOCARDIOGRAM COMPLETE Result Date: 09/07/2023    ECHOCARDIOGRAM REPORT   Patient Name:   PALLIE SWIGERT Date of Exam: 09/07/2023 Medical Rec #:  [id number]  Height:       66.0 in Accession #:    [id number] Weight:       160.1 lb Date of Birth:  Apr 01, 1961 BSA:          1.819 m Patient Age:    62 years   BP:           91/46 mmHg Patient Gender: F          HR:           105 bpm. Exam Location:  [facility] Procedure: 2D Echo, Cardiac Doppler and Color Doppler STAT ECHO Indications:     Shock R57.9  History:         Patient has no prior history of Echocardiogram examinations.                  Stroke; Risk Factors:Diabetes.  Sonographer:     Christopher Furnace Referring Phys:  [id number] KHABIB DGAYLI Diagnosing Phys: Lonni Hanson MD  Sonographer Comments: Image acquisition challenging due to uncooperative patient. IMPRESSIONS  1. Left ventricular ejection fraction, by estimation, is 70 to 75%. The left ventricle has hyperdynamic function. Left ventricular endocardial border not optimally defined to evaluate regional wall motion. There is mild left ventricular hypertrophy. Left ventricular diastolic parameters were normal.  2. Right ventricular  systolic function is normal. The right ventricular size is normal. Mildly increased right ventricular wall thickness. Tricuspid regurgitation signal is inadequate for assessing PA pressure.  3. The mitral valve is grossly normal. Trivial mitral valve regurgitation. No evidence of mitral stenosis.  4. The aortic valve was not well visualized. Aortic valve regurgitation is not visualized. No aortic stenosis is present.  5. The inferior vena cava is normal in size with greater than 50% respiratory variability, suggesting right atrial pressure of 3 mmHg. FINDINGS  Left Ventricle: Left ventricular ejection fraction, by estimation, is 70 to 75%. The left ventricle has hyperdynamic function. Left ventricular endocardial border not optimally defined to evaluate regional wall motion. The left ventricular internal cavity size was normal in size. There is mild left ventricular hypertrophy. Left ventricular diastolic parameters were normal. Right Ventricle: The right ventricular size is normal. Mildly increased right ventricular wall thickness. Right ventricular systolic function is normal. Tricuspid regurgitation signal is inadequate for assessing PA pressure. Left Atrium: Left atrial size was normal in size. Right Atrium: Right atrial size was normal in size. Pericardium: Trivial pericardial effusion is present. Mitral Valve: The mitral valve is grossly normal. Trivial mitral valve regurgitation. No evidence of mitral valve stenosis. MV peak gradient, 3.7 mmHg. The mean mitral valve gradient is 2.0 mmHg. Tricuspid Valve: The tricuspid valve is not well visualized. Tricuspid valve regurgitation is trivial. Aortic Valve: The aortic valve was not well visualized. Aortic valve regurgitation is not visualized. No aortic stenosis is present. Aortic valve mean gradient measures 6.0 mmHg. Aortic valve peak gradient measures 9.4 mmHg. Aortic valve area, by VTI measures 3.37 cm. Pulmonic Valve: The pulmonic valve was not well  visualized. Pulmonic valve regurgitation is not visualized. No evidence of pulmonic stenosis. Aorta: The aortic root is normal in size and structure. Pulmonary Artery: The pulmonary artery is not well seen. Venous: The inferior vena cava is normal in size with greater than 50% respiratory variability, suggesting right atrial pressure of 3 mmHg. IAS/Shunts: The interatrial septum was not well visualized.  LEFT VENTRICLE PLAX 2D LVIDd:         3.30 cm   Diastology LVIDs:         1.60 cm   LV e' medial:    9.14 cm/s LV PW:         1.10 cm   LV E/e' medial:  9.4 LV IVS:        1.30 cm   LV e' lateral:   10.60 cm/s LVOT diam:     1.90 cm   LV E/e' lateral: 8.1 LV SV:         89 LV SV Index:   49 LVOT Area:     2.84 cm  RIGHT VENTRICLE RV Basal diam:  3.20 cm RV Mid diam:    3.00 cm RV S prime:     19.80 cm/s TAPSE (M-mode): 2.0 cm LEFT ATRIUM           Index        RIGHT ATRIUM           Index LA diam:      2.00 cm 1.10 cm/m   RA Area:     13.20 cm LA Vol (A2C): 41.8 ml 22.98 ml/m  RA Volume:   31.70 ml  17.43 ml/m LA Vol (A4C): 13.6 ml 7.48 ml/m  AORTIC VALVE AV Area (Vmax):    3.00 cm AV Area (Vmean):   2.91 cm AV Area (VTI):  3.37 cm AV Vmax:           153.00 cm/s AV Vmean:          113.000 cm/s AV VTI:            0.263 m AV Peak Grad:      9.4 mmHg AV Mean Grad:      6.0 mmHg LVOT Vmax:         162.00 cm/s LVOT Vmean:        116.000 cm/s LVOT VTI:          0.313 m LVOT/AV VTI ratio: 1.19  AORTA Ao Root diam: 3.10 cm MITRAL VALVE MV Area (PHT): 2.74 cm    SHUNTS MV Area VTI:   5.29 cm    Systemic VTI:  0.31 m MV Peak grad:  3.7 mmHg    Systemic Diam: 1.90 cm MV Mean grad:  2.0 mmHg MV Vmax:       0.97 m/s MV Vmean:      64.1 cm/s MV Decel Time: 277 msec MV E velocity: 85.90 cm/s MV A velocity: 73.40 cm/s MV E/A ratio:  1.17 Lonni Hanson MD Electronically signed by Lonni Hanson MD Signature Date/Time: 09/07/2023/2:43:11 PM    Final    DG Chest Port 1 View Result Date: 09/07/2023 CLINICAL DATA:   [id number] with shortness of breath. EXAM: PORTABLE CHEST 1 VIEW COMPARISON:  Chest CT without contrast 09/04/2023 FINDINGS: 5:27 a.m. There is increased patchy airspace infiltrate in the right upper and lower lung fields consistent with pneumonia or aspiration. The lungs are hypoinflated and otherwise clear. The mediastinum is unchanged. Chronic dilatation and tortuosity of the esophagus again exaggerates the right paratracheal soft tissue stripe. There is mild aortic atherosclerosis.  The cardiac size is normal. No pleural effusion is seen.  No new osseous findings. IMPRESSION: 1. Increased patchy airspace infiltrate in the right upper and lower lung fields consistent with pneumonia or aspiration. 2. Hypoinflation. 3. Chronic dilatation and tortuosity of the esophagus. 4. Aortic atherosclerosis. Electronically Signed   By: Francis Quam M.D.   On: 09/07/2023 06:12   US  SOFT TISSUE HEAD & NECK (NON-THYROID ) Result Date: 09/04/2023 CLINICAL DATA:  Left parotid gland swelling. EXAM: ULTRASOUND OF HEAD/NECK SOFT TISSUES TECHNIQUE: Ultrasound examination of the head and neck soft tissues was performed in the area of clinical concern. COMPARISON:  Cervical spine CT 09/03/2022 FINDINGS: Targeted ultrasound was performed over the area of concern within the left parotid gland. No underlying mass or focal fluid collection identified. The parotid gland appears heterogeneous and enlarged. IMPRESSION: 1. No underlying mass or focal fluid collection identified. 2. The left parotid gland appears heterogeneous and enlarged. This is nonspecific but may reflect parotiditis. Electronically Signed   By: Waddell Calk M.D.   On: 09/04/2023 15:57   CT T-SPINE NO CHARGE Result Date: 09/04/2023 CLINICAL DATA:  Ground level fall. Hypoxia and tachycardia. Concern for traumatic injury to the thoracic area. EXAM: CT Thoracic and Lumbar spine without contrast TECHNIQUE: Multiplanar CT images of the thoracic and lumbar spine were  reconstructed from contemporary CT of the Chest, Abdomen, and Pelvis. RADIATION DOSE REDUCTION: This exam was performed according to the departmental dose-optimization program which includes automated exposure control, adjustment of the mA and/or kV according to patient size and/or use of iterative reconstruction technique. CONTRAST:  No additional COMPARISON:  Chest CT 04/07/2019.  Abdominopelvic CT 06/25/2017. FINDINGS: CT THORACIC SPINE FINDINGS Alignment: Normal. Vertebrae: Compared with the previous chest CT, there is new endplate sclerosis  in the superior endplate of T3 consistent with an age indeterminate fracture, potentially acute. Less than 10% loss of vertebral body height and no osseous retropulsion. No other evidence of acute thoracic spine injury. Paraspinal and other soft tissues: No acute paraspinal findings are identified. Chest CT findings dictated separately. Disc levels: Mild multilevel spondylosis with disc space narrowing and endplate osteophytes. No evidence of large disc herniation or high-grade spinal stenosis. CT LUMBAR SPINE FINDINGS Segmentation: There are 5 lumbar type vertebral bodies. Alignment: Normal. Vertebrae: The bones appear mildly demineralized. There is a new superior endplate Schmorl's node at L5 compared with previous abdominal CT. This demonstrates no definite acute features. No evidence of acute lumbar spine fracture or traumatic subluxation. Paraspinal and other soft tissues: No acute paraspinal findings. Abdominopelvic CT findings dictated separately. Disc levels: Mild multilevel spondylosis with mild disc space narrowing and endplate osteophyte formation inferiorly. Moderate to advanced facet disease from L3-4 through L5-S1 contributing to mild foraminal narrowing. IMPRESSION: 1. Age indeterminate fracture of the superior endplate of T3, potentially acute. Less than 10% loss of vertebral body height and no osseous retropulsion. 2. No other evidence of acute thoracic or  lumbar spine injury. 3. Mild multilevel spondylosis. 4. Chest, abdomen and pelvic CT findings dictated separately. Electronically Signed   By: Elsie Perone M.D.   On: 09/04/2023 12:59   CT L-SPINE NO CHARGE Result Date: 09/04/2023 CLINICAL DATA:  Ground level fall. Hypoxia and tachycardia. Concern for traumatic injury to the thoracic area. EXAM: CT Thoracic and Lumbar spine without contrast TECHNIQUE: Multiplanar CT images of the thoracic and lumbar spine were reconstructed from contemporary CT of the Chest, Abdomen, and Pelvis. RADIATION DOSE REDUCTION: This exam was performed according to the departmental dose-optimization program which includes automated exposure control, adjustment of the mA and/or kV according to patient size and/or use of iterative reconstruction technique. CONTRAST:  No additional COMPARISON:  Chest CT 04/07/2019.  Abdominopelvic CT 06/25/2017. FINDINGS: CT THORACIC SPINE FINDINGS Alignment: Normal. Vertebrae: Compared with the previous chest CT, there is new endplate sclerosis in the superior endplate of T3 consistent with an age indeterminate fracture, potentially acute. Less than 10% loss of vertebral body height and no osseous retropulsion. No other evidence of acute thoracic spine injury. Paraspinal and other soft tissues: No acute paraspinal findings are identified. Chest CT findings dictated separately. Disc levels: Mild multilevel spondylosis with disc space narrowing and endplate osteophytes. No evidence of large disc herniation or high-grade spinal stenosis. CT LUMBAR SPINE FINDINGS Segmentation: There are 5 lumbar type vertebral bodies. Alignment: Normal. Vertebrae: The bones appear mildly demineralized. There is a new superior endplate Schmorl's node at L5 compared with previous abdominal CT. This demonstrates no definite acute features. No evidence of acute lumbar spine fracture or traumatic subluxation. Paraspinal and other soft tissues: No acute paraspinal findings.  Abdominopelvic CT findings dictated separately. Disc levels: Mild multilevel spondylosis with mild disc space narrowing and endplate osteophyte formation inferiorly. Moderate to advanced facet disease from L3-4 through L5-S1 contributing to mild foraminal narrowing. IMPRESSION: 1. Age indeterminate fracture of the superior endplate of T3, potentially acute. Less than 10% loss of vertebral body height and no osseous retropulsion. 2. No other evidence of acute thoracic or lumbar spine injury. 3. Mild multilevel spondylosis. 4. Chest, abdomen and pelvic CT findings dictated separately. Electronically Signed   By: Elsie Perone M.D.   On: 09/04/2023 12:59   CT Head Wo Contrast Result Date: 09/04/2023 CLINICAL DATA:  Ground level fall with head injury EXAM:  CT HEAD WITHOUT CONTRAST CT CERVICAL SPINE WITHOUT CONTRAST TECHNIQUE: Multidetector CT imaging of the head and cervical spine was performed following the standard protocol without intravenous contrast. Multiplanar CT image reconstructions of the cervical spine were also generated. RADIATION DOSE REDUCTION: This exam was performed according to the departmental dose-optimization program which includes automated exposure control, adjustment of the mA and/or kV according to patient size and/or use of iterative reconstruction technique. COMPARISON:  07/05/2009 FINDINGS: CT HEAD FINDINGS Brain: No evidence of acute infarction, hemorrhage, hydrocephalus, extra-axial collection or mass lesion/mass effect. Chronic infarcts scattered along the left frontoparietal convexity and in the left basal ganglia which are in the MCA distribution. Chronic appearing lacunar infarct at the right thalamus. Vascular: No hyperdense vessel or unexpected calcification. Skull: No evidence of fracture Sinuses/Orbits: No evidence of injury. Retention cyst appearance in the inferior right maxillary sinus. CT CERVICAL SPINE FINDINGS Alignment: No traumatic malalignment Skull base and vertebrae:  No acute fracture. Soft tissues and spinal canal: Asymmetric parotid size, larger on the left where there is mild fat stranding, favor left parotiditis rather than right parotid atrophy. Disc levels: Multilevel degenerative endplate and facet spurring. No high-grade bony stenosis. Upper chest: Reported separately. IMPRESSION: 1. Expanded left parotid suggesting parotiditis, correlate for regional symptoms. 2. No acute intracranial or cervical spine finding. 3. 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 Electronically Signed   By: Dorn Roulette M.D.   On: 09/04/2023 12:52   CT Cervical Spine Wo Contrast Result Date: 09/04/2023 CLINICAL DATA:  Ground level fall with head injury EXAM: CT HEAD WITHOUT CONTRAST CT CERVICAL SPINE WITHOUT CONTRAST TECHNIQUE: Multidetector CT imaging of the head and cervical spine was performed following the standard protocol without intravenous contrast. Multiplanar CT image reconstructions of the cervical spine were also generated. RADIATION DOSE REDUCTION: This exam was performed according to the departmental dose-optimization program which includes automated exposure control, adjustment of the mA and/or kV according to patient size and/or use of iterative reconstruction technique. COMPARISON:  07/05/2009 FINDINGS: CT HEAD FINDINGS Brain: No evidence of acute infarction, hemorrhage, hydrocephalus, extra-axial collection or mass lesion/mass effect. Chronic infarcts scattered along the left frontoparietal convexity and in the left basal ganglia which are in the MCA distribution. Chronic appearing lacunar infarct at the right thalamus. Vascular: No hyperdense vessel or unexpected calcification. Skull: No evidence of fracture Sinuses/Orbits: No evidence of injury. Retention cyst appearance in the inferior right maxillary sinus. CT CERVICAL SPINE FINDINGS Alignment: No traumatic malalignment Skull base and vertebrae: No acute fracture. Soft tissues and  spinal canal: Asymmetric parotid size, larger on the left where there is mild fat stranding, favor left parotiditis rather than right parotid atrophy. Disc levels: Multilevel degenerative endplate and facet spurring. No high-grade bony stenosis. Upper chest: Reported separately. IMPRESSION: 1. Expanded left parotid suggesting parotiditis, correlate for regional symptoms. 2. No acute intracranial or cervical spine finding. 3. 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 Electronically Signed   By: Dorn Roulette M.D.   On: 09/04/2023 12:52   CT CHEST ABDOMEN PELVIS WO CONTRAST Result Date: 09/04/2023 CLINICAL DATA:  Ground level fall.  Hypoxia and tachycardia. EXAM: CT CHEST, ABDOMEN AND PELVIS WITHOUT CONTRAST TECHNIQUE: Multidetector CT imaging of the chest, abdomen and pelvis was performed following the standard protocol without IV contrast. RADIATION DOSE REDUCTION: This exam was performed according to the departmental dose-optimization program which includes automated exposure control, adjustment of the mA and/or kV according to patient size and/or use  of iterative reconstruction technique. COMPARISON:  Chest radiograph same date. Abdominopelvic CT 06/25/2017. Chest CT 04/07/2019. Esophagram 12/26/2019. FINDINGS: CT CHEST FINDINGS Cardiovascular: Atherosclerosis of the aorta, great vessels and coronary arteries. The heart size is normal. There is no pericardial effusion. Mediastinum/Nodes: There are no enlarged mediastinal, hilar or axillary lymph nodes. Chronic dilatation of the thoracic esophagus is again noted. The esophagus is diffusely fluid-filled. The thyroid  gland and trachea appear unremarkable. Lungs/Pleura: No pleural effusion or pneumothorax. Azygous fissure noted. There is mild biapical scarring. There is increased central airway thickening and patchy airspace opacities at both lung bases, favoring atelectasis. No consolidation. Musculoskeletal/Chest wall: No chest  wall mass or aggressive osseous lesions. There is new superior endplate sclerosis at T3 consistent with a mild age-indeterminate compression deformity. Additional spine details dictated separately. CT ABDOMEN AND PELVIS FINDINGS Hepatobiliary: Heterogeneous low density throughout the liver most consistent with heterogeneous hepatic steatosis. No focally suspicious lesions identified on noncontrast imaging. No evidence of gallstones, gallbladder wall thickening or biliary dilatation. Pancreas: Unremarkable. No pancreatic ductal dilatation or surrounding inflammatory changes. Spleen: Normal in size without focal abnormality. Adrenals/Urinary Tract: The adrenal glands are unchanged with low-density thickening bilaterally consistent with chronic hyperplasia. No specific follow-up imaging recommended. There are punctate nonobstructing bilateral renal calculi. No evidence of ureteral calculus or hydronephrosis. No suspicious renal lesion. The bladder appears normal for its degree of distention. Stomach/Bowel: No enteric contrast administered. The stomach appears unremarkable for its degree of distension. No evidence of bowel wall thickening, distention or surrounding inflammatory change. Mildly prominent stool throughout the colon. Vascular/Lymphatic: There are no enlarged abdominal or pelvic lymph nodes. Mild aortic and branch vessel atherosclerosis without evidence of aneurysm. Reproductive: Status post hysterectomy.  No adnexal mass. Other: No evidence of abdominal wall mass or hernia. No ascites or pneumoperitoneum. Musculoskeletal: Interval Schmorl's node involving the superior endplate of L5. Spine details dictated separately. No definite acute osseous findings. Chronic sacroiliac degenerative changes bilaterally. IMPRESSION: 1. No definite acute traumatic findings identified in the chest, abdomen or pelvis. 2. New superior endplate sclerosis at T3 consistent with a mild age-indeterminate compression deformity.  Thoracolumbar spine details dictated separately. 3. 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 Correlate clinically. 4. Increased central airway thickening and patchy airspace opacities at both lung bases, favoring atelectasis. 5. Heterogeneous hepatic steatosis. 6. Punctate nonobstructing bilateral renal calculi. 7.  Aortic Atherosclerosis (ICD10-I70.0). Electronically Signed   By: Elsie Perone M.D.   On: 09/04/2023 12:50   DG Chest Port 1 View Result Date: 09/04/2023 CLINICAL DATA:  Questionable sepsis. EXAM: PORTABLE CHEST 1 VIEW COMPARISON:  05/26/2019 FINDINGS: Heart size is normal. No pleural fluid, interstitial edema, or airspace consolidation. The visualized osseous structures are unremarkable. IMPRESSION: 1. No acute findings. Electronically Signed   By: Waddell Calk M.D.   On: 09/04/2023 09:51

## 2023-09-12 NOTE — Plan of Care (Signed)
  Problem: Fluid Volume: Goal: Hemodynamic stability will improve Outcome: Progressing   Problem: Clinical Measurements: Goal: Diagnostic test results will improve Outcome: Progressing Goal: Signs and symptoms of infection will decrease Outcome: Progressing   Problem: Respiratory: Goal: Ability to maintain adequate ventilation will improve Outcome: Progressing   Problem: Education: Goal: Knowledge of General Education information will improve Description: Including pain rating scale, medication(s)/side effects and non-pharmacologic comfort measures Outcome: Progressing   Problem: Health Behavior/Discharge Planning: Goal: Ability to manage health-related needs will improve Outcome: Progressing   Problem: Clinical Measurements: Goal: Ability to maintain clinical measurements within normal limits will improve Outcome: Progressing Goal: Will remain free from infection Outcome: Progressing Goal: Diagnostic test results will improve Outcome: Progressing Goal: Respiratory complications will improve Outcome: Progressing Goal: Cardiovascular complication will be avoided Outcome: Progressing   Problem: Activity: Goal: Risk for activity intolerance will decrease Outcome: Progressing   Problem: Nutrition: Goal: Adequate nutrition will be maintained Outcome: Progressing   Problem: Coping: Goal: Level of anxiety will decrease Outcome: Progressing   Problem: Elimination: Goal: Will not experience complications related to bowel motility Outcome: Progressing Goal: Will not experience complications related to urinary retention Outcome: Progressing   Problem: Pain Management: Goal: General experience of comfort will improve Outcome: Progressing   Problem: Safety: Goal: Ability to remain free from injury will improve Outcome: Progressing   Problem: Skin Integrity: Goal: Risk for impaired skin integrity will decrease Outcome: Progressing   Problem: Education: Goal:  Ability to describe self-care measures that may prevent or decrease complications (Diabetes Survival Skills Education) will improve Outcome: Progressing Goal: Individualized Educational Video(s) Outcome: Progressing   Problem: Coping: Goal: Ability to adjust to condition or change in health will improve Outcome: Progressing   Problem: Fluid Volume: Goal: Ability to maintain a balanced intake and output will improve Outcome: Progressing   Problem: Health Behavior/Discharge Planning: Goal: Ability to identify and utilize available resources and services will improve Outcome: Progressing Goal: Ability to manage health-related needs will improve Outcome: Progressing   Problem: Metabolic: Goal: Ability to maintain appropriate glucose levels will improve Outcome: Progressing   Problem: Nutritional: Goal: Maintenance of adequate nutrition will improve Outcome: Progressing Goal: Progress toward achieving an optimal weight will improve Outcome: Progressing   Problem: Skin Integrity: Goal: Risk for impaired skin integrity will decrease Outcome: Progressing   Problem: Tissue Perfusion: Goal: Adequacy of tissue perfusion will improve Outcome: Progressing

## 2023-09-13 ENCOUNTER — Inpatient Hospital Stay: Payer: Medicaid Other

## 2023-09-13 DIAGNOSIS — J9601 Acute respiratory failure with hypoxia: Secondary | ICD-10-CM | POA: Insufficient documentation

## 2023-09-13 DIAGNOSIS — D696 Thrombocytopenia, unspecified: Secondary | ICD-10-CM | POA: Diagnosis not present

## 2023-09-13 DIAGNOSIS — A419 Sepsis, unspecified organism: Secondary | ICD-10-CM | POA: Diagnosis not present

## 2023-09-13 DIAGNOSIS — N179 Acute kidney failure, unspecified: Secondary | ICD-10-CM | POA: Diagnosis not present

## 2023-09-13 LAB — BASIC METABOLIC PANEL
Anion gap: 10 (ref 5–15)
BUN: 66 mg/dL — ABNORMAL HIGH (ref 8–23)
CO2: 28 mmol/L (ref 22–32)
Calcium: 7.3 mg/dL — ABNORMAL LOW (ref 8.9–10.3)
Chloride: 105 mmol/L (ref 98–111)
Creatinine, Ser: 1.19 mg/dL — ABNORMAL HIGH (ref 0.44–1.00)
GFR, Estimated: 51 mL/min — ABNORMAL LOW (ref >60)
Glucose, Bld: 162 mg/dL — ABNORMAL HIGH (ref 70–99)
Potassium: 3.6 mmol/L (ref 3.5–5.1)
Sodium: 143 mmol/L (ref 135–145)

## 2023-09-13 LAB — PREPARE PLATELET PHERESIS
Unit division: 0
Unit division: 0

## 2023-09-13 LAB — CBC
HCT: 25.7 % — ABNORMAL LOW (ref 36.0–46.0)
HCT: 28 % — ABNORMAL LOW (ref 36.0–46.0)
Hemoglobin: 8.8 g/dL — ABNORMAL LOW (ref 12.0–15.0)
Hemoglobin: 9.6 g/dL — ABNORMAL LOW (ref 12.0–15.0)
MCH: 31.1 pg (ref 26.0–34.0)
MCH: 31.3 pg (ref 26.0–34.0)
MCHC: 34.2 g/dL (ref 30.0–36.0)
MCHC: 34.3 g/dL (ref 30.0–36.0)
MCV: 90.8 fL (ref 80.0–100.0)
MCV: 91.2 fL (ref 80.0–100.0)
Platelets: 30 10*3/uL — ABNORMAL LOW (ref 150–400)
Platelets: 30 10*3/uL — ABNORMAL LOW (ref 150–400)
RBC: 2.83 MIL/uL — ABNORMAL LOW (ref 3.87–5.11)
RBC: 3.07 MIL/uL — ABNORMAL LOW (ref 3.87–5.11)
RDW: 15.7 % — ABNORMAL HIGH (ref 11.5–15.5)
RDW: 15.7 % — ABNORMAL HIGH (ref 11.5–15.5)
WBC: 5.9 10*3/uL (ref 4.0–10.5)
WBC: 7 10*3/uL (ref 4.0–10.5)
nRBC: 0 % (ref 0.0–0.2)
nRBC: 0 % (ref 0.0–0.2)

## 2023-09-13 LAB — HEPATIC FUNCTION PANEL
ALT: 18 U/L (ref 0–44)
AST: 26 U/L (ref 15–41)
Albumin: 1.6 g/dL — ABNORMAL LOW (ref 3.5–5.0)
Alkaline Phosphatase: 151 U/L — ABNORMAL HIGH (ref 38–126)
Bilirubin, Direct: 0.1 mg/dL (ref 0.0–0.2)
Indirect Bilirubin: 0.5 mg/dL (ref 0.3–0.9)
Total Bilirubin: 0.6 mg/dL (ref 0.0–1.2)
Total Protein: 4.5 g/dL — ABNORMAL LOW (ref 6.5–8.1)

## 2023-09-13 LAB — BPAM PLATELET PHERESIS
Blood Product Expiration Date: 202501122359
Blood Product Expiration Date: 202501122359
ISSUE DATE / TIME: 202501120953
ISSUE DATE / TIME: 202501121234
Unit Type and Rh: 5100
Unit Type and Rh: 7300

## 2023-09-13 LAB — GLUCOSE, CAPILLARY
Glucose-Capillary: 142 mg/dL — ABNORMAL HIGH (ref 70–99)
Glucose-Capillary: 148 mg/dL — ABNORMAL HIGH (ref 70–99)
Glucose-Capillary: 173 mg/dL — ABNORMAL HIGH (ref 70–99)
Glucose-Capillary: 201 mg/dL — ABNORMAL HIGH (ref 70–99)

## 2023-09-13 LAB — HAPTOGLOBIN: Haptoglobin: 223 mg/dL (ref 37–355)

## 2023-09-13 MED ORDER — FREE WATER: 200.0000 mL | Status: DC

## 2023-09-13 MED ORDER — FREE WATER
125.0000 mL | Status: DC
  Administered 2023-09-13 – 2023-09-17 (×17): 125 mL

## 2023-09-13 MED ORDER — IOHEXOL 350 MG/ML SOLN
75.0000 mL | Freq: Once | INTRAVENOUS | Status: AC | PRN
  Administered 2023-09-13: 75 mL via INTRAVENOUS

## 2023-09-13 NOTE — Plan of Care (Signed)
  Problem: Fluid Volume: Goal: Hemodynamic stability will improve Outcome: Progressing   Problem: Clinical Measurements: Goal: Diagnostic test results will improve Outcome: Progressing Goal: Signs and symptoms of infection will decrease Outcome: Progressing   Problem: Respiratory: Goal: Ability to maintain adequate ventilation will improve Outcome: Progressing   Problem: Education: Goal: Knowledge of General Education information will improve Description: Including pain rating scale, medication(s)/side effects and non-pharmacologic comfort measures Outcome: Progressing   Problem: Health Behavior/Discharge Planning: Goal: Ability to manage health-related needs will improve Outcome: Progressing   Problem: Clinical Measurements: Goal: Ability to maintain clinical measurements within normal limits will improve Outcome: Progressing Goal: Will remain free from infection Outcome: Progressing Goal: Diagnostic test results will improve Outcome: Progressing Goal: Respiratory complications will improve Outcome: Progressing Goal: Cardiovascular complication will be avoided Outcome: Progressing   Problem: Activity: Goal: Risk for activity intolerance will decrease Outcome: Progressing   Problem: Nutrition: Goal: Adequate nutrition will be maintained Outcome: Progressing   Problem: Coping: Goal: Level of anxiety will decrease Outcome: Progressing   Problem: Elimination: Goal: Will not experience complications related to bowel motility Outcome: Progressing Goal: Will not experience complications related to urinary retention Outcome: Progressing   Problem: Pain Management: Goal: General experience of comfort will improve Outcome: Progressing   Problem: Safety: Goal: Ability to remain free from injury will improve Outcome: Progressing   Problem: Skin Integrity: Goal: Risk for impaired skin integrity will decrease Outcome: Progressing   Problem: Education: Goal:  Ability to describe self-care measures that may prevent or decrease complications (Diabetes Survival Skills Education) will improve Outcome: Progressing Goal: Individualized Educational Video(s) Outcome: Progressing   Problem: Coping: Goal: Ability to adjust to condition or change in health will improve Outcome: Progressing   Problem: Fluid Volume: Goal: Ability to maintain a balanced intake and output will improve Outcome: Progressing   Problem: Health Behavior/Discharge Planning: Goal: Ability to identify and utilize available resources and services will improve Outcome: Progressing Goal: Ability to manage health-related needs will improve Outcome: Progressing   Problem: Metabolic: Goal: Ability to maintain appropriate glucose levels will improve Outcome: Progressing   Problem: Nutritional: Goal: Maintenance of adequate nutrition will improve Outcome: Progressing Goal: Progress toward achieving an optimal weight will improve Outcome: Progressing   Problem: Skin Integrity: Goal: Risk for impaired skin integrity will decrease Outcome: Progressing   Problem: Tissue Perfusion: Goal: Adequacy of tissue perfusion will improve Outcome: Progressing

## 2023-09-13 NOTE — Progress Notes (Signed)
 Per Sreeram MD, NGT safe to use.

## 2023-09-13 NOTE — Plan of Care (Signed)
  Problem: Fluid Volume: Goal: Hemodynamic stability will improve Outcome: Progressing   Problem: Clinical Measurements: Goal: Respiratory complications will improve Outcome: Progressing Goal: Cardiovascular complication will be avoided Outcome: Progressing   Problem: Nutrition: Goal: Adequate nutrition will be maintained Outcome: Progressing   Problem: Coping: Goal: Level of anxiety will decrease Outcome: Progressing   Problem: Elimination: Goal: Will not experience complications related to bowel motility Outcome: Progressing Goal: Will not experience complications related to urinary retention Outcome: Progressing   Problem: Pain Management: Goal: General experience of comfort will improve Outcome: Progressing   Problem: Safety: Goal: Ability to remain free from injury will improve Outcome: Progressing   Problem: Coping: Goal: Ability to adjust to condition or change in health will improve Outcome: Progressing   Problem: Fluid Volume: Goal: Ability to maintain a balanced intake and output will improve Outcome: Progressing   Problem: Metabolic: Goal: Ability to maintain appropriate glucose levels will improve Outcome: Progressing   Problem: Tissue Perfusion: Goal: Adequacy of tissue perfusion will improve Outcome: Progressing

## 2023-09-13 NOTE — Progress Notes (Signed)
 Progress Note   Patient: Kaitlyn Hodges FMW:[id number] DOB: [birth year] DOA: 09/04/2023     9 DOS: the patient was seen and examined on 09/13/2023   Brief hospital course: Kaitlyn Hodges is a 63 y.o. female with medical history significant of prediabetes, CVA, seizure disorder, type I achalasia,tobacco use disorder, erosive esophagitis who presents to the ED due to altered mental status.  Patient's daughter noted patient had dyspnea and cough for a month prior to admission and for the last week she had N/V and poor oral intake.  Patient was afebrile, hypotensive and tachycardic on vitals. She had a mild acidosis with increased AG. Noted lactate of 7.7. WBC of 32.9. BUN/cr 114/4.61. COVID, flu, RSV negative. Pan scan notable for possible L parotiditis, central airway thickening and patchy airspace opacities in the bases, age indeterminate T3 fracture.  Patient was started on broad spectrum antibiotics and fluid resuscitated.   Assessment and Plan: Severe sepsis. Community-acquired pneumonia. Acute hypoxic respiratory failure. Patient presenting with hypotension, tachycardia, marked leukocytosis and lactic acidosis.  Patient's white count normal, lactic acid trended down to 2.8 Respiratory panel-negative. MRSA PCR negative. Off Bipap and Levophed  now. She is currently on HFNC 65%, 50L saturating 98%. Continue to taper O2 as tolerated to maintain sat > 92%. Zosyn  changed to cefepime  due to possible DITP. Urine cultures insignificant growth. Blood cultures so far negative. Continue pulmonary toilet, out of bed to chair, incentive spirometry.   Hemorrhagic shock secondary to acute GI bleed S/p EGD on 09/08/2023, showed tortuous esophagus, diffuse inflammation in the esophagus and the small bowel which is not amenable to any endoscopic procedure. GI signed off. Patient received blood transfusion on 09/07/23. Continue NG feeds. Continue to monitor H/H closely. Given her low platelets she is at high risk for  bleeding. Hb today 9.6.   AKI (acute kidney injury) (HCC) 2/2 hypovolemia and severe sepsis. In the setting of poor p.o. intake, severe uncontrolled achalasia. Renal function improving. Continue NG feeds, free water . Stopped IV fluids 09/11/23. Avoid nephrotoxic drugs.   Hypokalemia Hypophosphatemia- Improved. Pharmacy on board. Continue to replace electrolytes as needed.  Hypernatremia Sodium improved to 143. Trend Na. Stop IV fluids. Free water  changed to 125ml q4hr.  Thrombocytopenia: Hematology consult appreciated. 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 Zosyn  changed to cefepime . Suspicion for TTP, DIC is low. Possible DITP from zosyn . No active bleeding. Platelet count dropped to 15 09/12/23, got 2 units of platelets. Platelets today 30. Continue to monitor platelet count daily.   Acute metabolic encephalopathy In the setting of sepsis, hypovolemia, hemorrhagic shock, electrolyte abnormalities.  Mental status still not baseline, more lethargic, oriented to self. MRI brain done 1/9 showed dural thickening versus thin subdural collection overlying the left cerebral hemisphere, measuring up to 3-4 mm in thickness. Neurosurgery reviewed imaging no surgical indication. EEG showed moderate diffuse encephalopathy. No seizures or epileptiform discharges. Continue nursing supportive care. Neurochecks. Delirium precautions. Continue aspiration precaution   Pre-diabetes Continue Accucheks with q6hr sliding scale, continue tube feeds. Hypoglycemia protocol.   Closed wedge compression fracture of T3 vertebra (HCC) Incidental finding of a T3 compression fracture on imaging.  She had fall 2 weeks ago. Continue pain management   History of CVA (cerebrovascular accident) CT of the head obtained showed chronic infarcts scattered along the left frontoparietal convexity in the left basal ganglia, as well as lacunar infarcts in the right thalamus. No acute findings. Will  consider aspirin, statin once stable.   Seizure disorder (HCC) History of seizure  disorder since CVA in 2010.  No reported seizures since that time. EEG showed moderate diffuse encephalopathy. No seizures or epileptiform discharges. Continue seizure precautions.  Nutrition Documentation    Flowsheet Row ED to Hosp-Admission (Current) from 09/04/2023 in [facility] ICU/CCU  Nutrition Problem Moderate Malnutrition  Etiology chronic illness  Nutrition Goal Patient will meet greater than or equal to 90% of their needs     ,  Active Pressure Injury/Wound(s)     Pressure Ulcer  Duration          Pressure Injury 09/06/23 Buttocks Medial Stage 2 -  Partial thickness loss of dermis presenting as a shallow open injury with a red, pink wound bed without slough. small stage 2, skin breakdown at top of gluteal cleft 2 days           Nursing supportive care. Fall, aspiration precautions. DVT prophylaxis SCD   Code Status: Full Code  Subjective: Patient is seen and examined today. She is sleepy, arousable, able to mouth her name, very weak. No family at bedside. She pulled her NG out, replaced this morning. Rectal tube with liquid stool. Physical Exam: Vitals:   09/13/23 0600 09/13/23 0751 09/13/23 0800 09/13/23 0900  BP: 112/68  101/68 100/74  Pulse: 92  85 80  Resp: (!) 22  17 19   Temp:  98.2 F (36.8 C)    TempSrc:  Oral    SpO2: 100%  98% 99%  Weight:      Height:        General - Elderly ill looking Caucasian female, on HFNC HEENT - PERRLA, EOMI, atraumatic head, non tender sinuses. Lung - Clear, diffuse rhonchi, no wheezes. Heart - S1, S2 heard, no murmurs, rubs, trace pedal edema. Abdomen - Soft, non tender, nondistended, bowel sounds good Neuro - sleepy and lethargic, unable to do full neuro exam, non focal exam Skin - Warm and dry.  Data Reviewed:      Latest Ref Rng & Units 09/13/2023    6:44 AM 09/13/2023    3:22 AM 09/12/2023    3:03 PM  CBC   WBC 4.0 - 10.5 K/uL 7.0  5.9  6.1   Hemoglobin 12.0 - 15.0 g/dL 9.6  8.8  89.3   Hematocrit 36.0 - 46.0 % 28.0  25.7  32.1   Platelets 150 - 400 K/uL 30  30  44       Latest Ref Rng & Units 09/13/2023    3:22 AM 09/12/2023    4:29 AM 09/11/2023    2:13 AM  BMP  Glucose 70 - 99 mg/dL 837  762  799   BUN 8 - 23 mg/dL 66  75  81   Creatinine 0.44 - 1.00 mg/dL 8.80  8.69  8.62   Sodium 135 - 145 mmol/L 143  143  149   Potassium 3.5 - 5.1 mmol/L 3.6  3.8  3.2   Chloride 98 - 111 mmol/L 105  102  113   CO2 22 - 32 mmol/L 28  29  29    Calcium 8.9 - 10.3 mg/dL 7.3  7.1  7.2    DG Abd 1 View Result Date: 09/13/2023 CLINICAL DATA:  63 year old female status post nasogastric tube placement. EXAM: ABDOMEN - 1 VIEW COMPARISON:  09/09/2023. FINDINGS: Nasogastric tube extending into the stomach with side port approximately 9 cm distal to the gastroesophageal junction, and tip in the distal body of the stomach. Gas and stool are seen scattered throughout the colon extending  to the level of the distal rectum. No pathologic distension of small bowel is noted. No gross evidence of pneumoperitoneum. IMPRESSION: 1. Support apparatus, as above. 2. Nonobstructive bowel-gas pattern. Electronically Signed   By: Toribio Aye M.D.   On: 09/13/2023 07:05   DG Chest Port 1 View Result Date: 09/12/2023 CLINICAL DATA:  242050 Oxygen desaturation [id number] EXAM: PORTABLE CHEST 1 VIEW COMPARISON:  09/08/2023 FINDINGS: Cardiomegaly. Aortic atherosclerosis. Enteric tube courses below the diaphragm with distal tip beyond the inferior margin of the film. Diffuse bilateral interstitial opacities, overall progressed. Previously seen airspace consolidation in the right perihilar region is improved. Probable small bilateral pleural effusions. No pneumothorax. IMPRESSION: 1. Diffuse bilateral interstitial opacities, overall progressed. Previously seen airspace consolidation in the right perihilar region is improved. 2. Probable small  bilateral pleural effusions. Electronically Signed   By: Mabel Converse D.O.   On: 09/12/2023 16:15    Family Discussion- Discussed with son over phone regarding her current care, he understands, all questions answered.  Disposition: Status is: Inpatient for severe electrolyte abnormalities, very high white count, high lactic acid Remains inpatient appropriate because: Stepdown unit  Planned Discharge Destination: Skilled nursing facility     MDM level 3-patient is sick presented with altered mentation, hemorrhagic shock, respiratory failure, GI bleed.  Patient's platelet count dropped, her mental status, kidney function is not baseline, hypoxia requiring HFNC.  She is at high risk for sudden clinical deterioration.  Author: Concepcion Riser, MD 09/13/2023 10:12 AM Secure chat 7am to 7pm For on call review [email or web address].

## 2023-09-13 NOTE — Progress Notes (Signed)
 M. Jawo NP made aware of drop in hemoglobin from 10.6 to 8.8. Recheck pending.   NG tube was self-removed by the patient at 5:20AM. Awaiting verification from Moduo Jawo NP before replacing d/t thrombocytopenia - platelets 30 this AM. Tube feeds and free water  flush currently being held.   Instructed to replace, but stop if there is increased resistance or insertion is difficult. NGT replaced at 6:20AM. Tolerated well by patient - awaiting X-ray verification at this time.

## 2023-09-14 DIAGNOSIS — K2970 Gastritis, unspecified, without bleeding: Secondary | ICD-10-CM | POA: Diagnosis not present

## 2023-09-14 DIAGNOSIS — N179 Acute kidney failure, unspecified: Secondary | ICD-10-CM | POA: Diagnosis not present

## 2023-09-14 DIAGNOSIS — R710 Precipitous drop in hematocrit: Secondary | ICD-10-CM

## 2023-09-14 DIAGNOSIS — R195 Other fecal abnormalities: Secondary | ICD-10-CM | POA: Diagnosis not present

## 2023-09-14 DIAGNOSIS — A419 Sepsis, unspecified organism: Secondary | ICD-10-CM | POA: Diagnosis not present

## 2023-09-14 DIAGNOSIS — K2980 Duodenitis without bleeding: Secondary | ICD-10-CM

## 2023-09-14 DIAGNOSIS — D696 Thrombocytopenia, unspecified: Secondary | ICD-10-CM | POA: Diagnosis not present

## 2023-09-14 DIAGNOSIS — K209 Esophagitis, unspecified without bleeding: Secondary | ICD-10-CM | POA: Diagnosis not present

## 2023-09-14 LAB — BASIC METABOLIC PANEL
Anion gap: 7 (ref 5–15)
BUN: 72 mg/dL — ABNORMAL HIGH (ref 8–23)
CO2: 28 mmol/L (ref 22–32)
Calcium: 7.4 mg/dL — ABNORMAL LOW (ref 8.9–10.3)
Chloride: 109 mmol/L (ref 98–111)
Creatinine, Ser: 1.22 mg/dL — ABNORMAL HIGH (ref 0.44–1.00)
GFR, Estimated: 50 mL/min — ABNORMAL LOW (ref >60)
Glucose, Bld: 182 mg/dL — ABNORMAL HIGH (ref 70–99)
Potassium: 3.7 mmol/L (ref 3.5–5.1)
Sodium: 144 mmol/L (ref 135–145)

## 2023-09-14 LAB — CBC
HCT: 22.4 % — ABNORMAL LOW (ref 36.0–46.0)
Hemoglobin: 7.6 g/dL — ABNORMAL LOW (ref 12.0–15.0)
MCH: 30.8 pg (ref 26.0–34.0)
MCHC: 33.9 g/dL (ref 30.0–36.0)
MCV: 90.7 fL (ref 80.0–100.0)
Platelets: 33 10*3/uL — ABNORMAL LOW (ref 150–400)
RBC: 2.47 MIL/uL — ABNORMAL LOW (ref 3.87–5.11)
RDW: 15.5 % (ref 11.5–15.5)
WBC: 6.6 10*3/uL (ref 4.0–10.5)
nRBC: 0 % (ref 0.0–0.2)

## 2023-09-14 LAB — GLUCOSE, CAPILLARY
Glucose-Capillary: 180 mg/dL — ABNORMAL HIGH (ref 70–99)
Glucose-Capillary: 198 mg/dL — ABNORMAL HIGH (ref 70–99)
Glucose-Capillary: 223 mg/dL — ABNORMAL HIGH (ref 70–99)
Glucose-Capillary: 229 mg/dL — ABNORMAL HIGH (ref 70–99)

## 2023-09-14 LAB — HEMOGLOBIN AND HEMATOCRIT, BLOOD
HCT: 24.4 % — ABNORMAL LOW (ref 36.0–46.0)
Hemoglobin: 8 g/dL — ABNORMAL LOW (ref 12.0–15.0)

## 2023-09-14 LAB — HEPARIN INDUCED PLATELET AB (HIT ANTIBODY): Heparin Induced Plt Ab: 0.16 {OD_unit} (ref 0.000–0.400)

## 2023-09-14 LAB — OCCULT BLOOD X 1 CARD TO LAB, STOOL: Fecal Occult Bld: POSITIVE — AB

## 2023-09-14 LAB — MAGNESIUM: Magnesium: 1.8 mg/dL (ref 1.7–2.4)

## 2023-09-14 LAB — PHOSPHORUS: Phosphorus: 2.4 mg/dL — ABNORMAL LOW (ref 2.5–4.6)

## 2023-09-14 MED ORDER — ONDANSETRON HCL 4 MG/2ML IJ SOLN: 4.0000 mg | Freq: Four times a day (QID) | INTRAMUSCULAR | Status: DC | PRN

## 2023-09-14 MED ORDER — K PHOS MONO-SOD PHOS DI & MONO 155-852-130 MG PO TABS
500.0000 mg | ORAL_TABLET | ORAL | Status: AC
  Administered 2023-09-14 (×2): 500 mg
  Filled 2023-09-14 (×3): qty 2

## 2023-09-14 MED ORDER — ACETAMINOPHEN 325 MG PO TABS
650.0000 mg | ORAL_TABLET | Freq: Four times a day (QID) | ORAL | Status: DC | PRN
  Administered 2023-09-14 – 2023-09-25 (×7): 650 mg
  Filled 2023-09-14 (×8): qty 2

## 2023-09-14 MED ORDER — FUROSEMIDE 10 MG/ML IJ SOLN
20.0000 mg | Freq: Two times a day (BID) | INTRAMUSCULAR | Status: DC
  Administered 2023-09-14 – 2023-09-15 (×3): 20 mg via INTRAVENOUS
  Filled 2023-09-14 (×3): qty 2

## 2023-09-14 MED ORDER — SODIUM CHLORIDE 0.9% IV SOLUTION: Freq: Once | INTRAVENOUS | Status: AC

## 2023-09-14 MED ORDER — ACETAMINOPHEN 650 MG RE SUPP: 650.0000 mg | Freq: Four times a day (QID) | RECTAL | Status: DC | PRN

## 2023-09-14 MED ORDER — ONDANSETRON HCL 4 MG PO TABS: 4.0000 mg | ORAL_TABLET | Freq: Four times a day (QID) | ORAL | Status: DC | PRN

## 2023-09-14 NOTE — Progress Notes (Signed)
 Made Dr. Clide Dales fecal occult resulted positive.

## 2023-09-14 NOTE — Plan of Care (Signed)
  Problem: Fluid Volume: Goal: Hemodynamic stability will improve Outcome: Progressing   Problem: Clinical Measurements: Goal: Diagnostic test results will improve Outcome: Progressing Goal: Signs and symptoms of infection will decrease Outcome: Progressing   Problem: Respiratory: Goal: Ability to maintain adequate ventilation will improve Outcome: Progressing   Problem: Education: Goal: Knowledge of General Education information will improve Description: Including pain rating scale, medication(s)/side effects and non-pharmacologic comfort measures Outcome: Progressing   Problem: Health Behavior/Discharge Planning: Goal: Ability to manage health-related needs will improve Outcome: Progressing   Problem: Clinical Measurements: Goal: Ability to maintain clinical measurements within normal limits will improve Outcome: Progressing Goal: Will remain free from infection Outcome: Progressing Goal: Diagnostic test results will improve Outcome: Progressing Goal: Respiratory complications will improve Outcome: Progressing Goal: Cardiovascular complication will be avoided Outcome: Progressing   Problem: Activity: Goal: Risk for activity intolerance will decrease Outcome: Progressing   Problem: Nutrition: Goal: Adequate nutrition will be maintained Outcome: Progressing   Problem: Coping: Goal: Level of anxiety will decrease Outcome: Progressing   Problem: Elimination: Goal: Will not experience complications related to bowel motility Outcome: Progressing Goal: Will not experience complications related to urinary retention Outcome: Progressing   Problem: Pain Management: Goal: General experience of comfort will improve Outcome: Progressing   Problem: Safety: Goal: Ability to remain free from injury will improve Outcome: Progressing   Problem: Skin Integrity: Goal: Risk for impaired skin integrity will decrease Outcome: Progressing   Problem: Education: Goal:  Ability to describe self-care measures that may prevent or decrease complications (Diabetes Survival Skills Education) will improve Outcome: Progressing Goal: Individualized Educational Video(s) Outcome: Progressing   Problem: Coping: Goal: Ability to adjust to condition or change in health will improve Outcome: Progressing   Problem: Fluid Volume: Goal: Ability to maintain a balanced intake and output will improve Outcome: Progressing   Problem: Health Behavior/Discharge Planning: Goal: Ability to identify and utilize available resources and services will improve Outcome: Progressing Goal: Ability to manage health-related needs will improve Outcome: Progressing   Problem: Metabolic: Goal: Ability to maintain appropriate glucose levels will improve Outcome: Progressing   Problem: Nutritional: Goal: Maintenance of adequate nutrition will improve Outcome: Progressing Goal: Progress toward achieving an optimal weight will improve Outcome: Progressing   Problem: Skin Integrity: Goal: Risk for impaired skin integrity will decrease Outcome: Progressing   Problem: Tissue Perfusion: Goal: Adequacy of tissue perfusion will improve Outcome: Progressing

## 2023-09-14 NOTE — Progress Notes (Signed)
 Rogelia Copping, MD [facility]   [address] [place], [place] [address] Phone: [phone] Fax : [phone]   Subjective: The patient continues to drop her hemoglobin.  The patient had diffuse esophagitis gastritis and duodenitis with a layer of mucus and blood over the entire GI tract examined.  The patient has a significant drop in hemoglobin and I was reconsulted to see the patient.  The patient is much more awake today and responsive and is able to answer questions.   Objective: Vital signs in last 24 hours: Vitals:   09/14/23 1142 09/14/23 1200 09/14/23 1300 09/14/23 1357  BP:  103/62 107/66 104/63  Pulse:  90 86 87  Resp:  17 13 16   Temp: 98 F (36.7 C)   97.9 F (36.6 C)  TempSrc: Axillary   Axillary  SpO2:  100% 100% 100%  Weight:      Height:       Weight change: 0 kg  Intake/Output Summary (Last 24 hours) at 09/14/2023 1417 Last data filed at 09/14/2023 1248 Gross per 24 hour  Intake 1715.97 ml  Output 2950 ml  Net -1234.03 ml     Exam: Heart:: Regular rate and rhythm or without murmur or extra heart sounds Lungs: normal and clear to auscultation and percussion Abdomen: soft, nontender, normal bowel sounds   Lab Results: @LABTEST2 @ Micro Results: Recent Results (from the past 240 hours)  Respiratory (~20 pathogens) panel by PCR     Status: None   Collection Time: 09/04/23  2:47 PM   Specimen: Nasopharyngeal Swab; Respiratory  Result Value Ref Range Status   Adenovirus NOT DETECTED NOT DETECTED Final   Coronavirus 229E NOT DETECTED NOT DETECTED Final    Comment: (NOTE) The Coronavirus on the Respiratory Panel, DOES NOT test for the novel  Coronavirus (2019 nCoV)    Coronavirus HKU1 NOT DETECTED NOT DETECTED Final   Coronavirus NL63 NOT DETECTED NOT DETECTED Final   Coronavirus OC43 NOT DETECTED NOT DETECTED Final   Metapneumovirus NOT DETECTED NOT DETECTED Final   Rhinovirus / Enterovirus NOT DETECTED NOT DETECTED Final   Influenza A NOT  DETECTED NOT DETECTED Final   Influenza B NOT DETECTED NOT DETECTED Final   Parainfluenza Virus 1 NOT DETECTED NOT DETECTED Final   Parainfluenza Virus 2 NOT DETECTED NOT DETECTED Final   Parainfluenza Virus 3 NOT DETECTED NOT DETECTED Final   Parainfluenza Virus 4 NOT DETECTED NOT DETECTED Final   Respiratory Syncytial Virus NOT DETECTED NOT DETECTED Final   Bordetella pertussis NOT DETECTED NOT DETECTED Final   Bordetella Parapertussis NOT DETECTED NOT DETECTED Final   Chlamydophila pneumoniae NOT DETECTED NOT DETECTED Final   Mycoplasma pneumoniae NOT DETECTED NOT DETECTED Final    Comment: Performed at [facility], [address], [place], [place] [address]  Urine Culture     Status: Abnormal   Collection Time: 09/04/23  6:00 PM   Specimen: Urine, Random  Result Value Ref Range Status   Specimen Description   Final    URINE, RANDOM Performed at [facility], [address] [place], [place] [address]    Special Requests   Final    NONE Reflexed from [id number] Performed at [facility], [address] [place], [place] [address]    Culture (A)  Final    <10,000 COLONIES/mL INSIGNIFICANT GROWTH Performed at [facility], [address] [place], [place] [address]    Report Status 09/06/2023 FINAL  Final  MRSA Next Gen  by PCR, Nasal     Status: None   Collection Time: 09/06/23  8:33 AM   Specimen: Nasal Mucosa; Nasal Swab  Result Value Ref Range Status   MRSA by PCR Next Gen NOT DETECTED NOT DETECTED Final    Comment: (NOTE) The GeneXpert MRSA Assay (FDA approved for NASAL specimens only), is one component of a comprehensive MRSA colonization surveillance program. It is not intended to diagnose MRSA infection nor to guide or monitor treatment for MRSA infections. Test performance is not FDA approved in patients less than 44 years old. Performed at [facility], [address] [place], [place] [address]   MRSA Next Gen by PCR, Nasal      Status: None   Collection Time: 09/08/23  4:20 PM   Specimen: Nasal Mucosa; Nasal Swab  Result Value Ref Range Status   MRSA by PCR Next Gen NOT DETECTED NOT DETECTED Final    Comment: (NOTE) The GeneXpert MRSA Assay (FDA approved for NASAL specimens only), is one component of a comprehensive MRSA colonization surveillance program. It is not intended to diagnose MRSA infection nor to guide or monitor treatment for MRSA infections. Test performance is not FDA approved in patients less than 29 years old. Performed at [facility], [address] [place], [place] [address]    Studies/Results: US  Venous Img Lower Bilateral (DVT) Result Date: 09/13/2023 CLINICAL DATA:  Elevated D-dimer. EXAM: BILATERAL LOWER EXTREMITY VENOUS DOPPLER ULTRASOUND TECHNIQUE: Gray-scale sonography with compression, as well as color and duplex ultrasound, were performed to evaluate the deep venous system(s) from the level of the common femoral vein through the popliteal and proximal calf veins. COMPARISON:  None Available. FINDINGS: VENOUS Normal compressibility of the common femoral, superficial femoral, and popliteal veins, as well as the visualized calf veins. Visualized portions of profunda femoral vein and great saphenous vein unremarkable. No filling defects to suggest DVT on grayscale or color Doppler imaging. Doppler waveforms show normal direction of venous flow, normal respiratory plasticity and response to augmentation. OTHER None. Limitations: none IMPRESSION: No evidence of DVT in either lower extremity. Electronically Signed   By: Andrea Gasman M.D.   On: 09/13/2023 15:40   CT Angio Chest Pulmonary Embolism (PE) W or WO Contrast Result Date: 09/13/2023 CLINICAL DATA:  Pulmonary embolism (PE) suspected, high prob high d-dimer, hypoxia EXAM: CT ANGIOGRAPHY CHEST WITH CONTRAST TECHNIQUE: Multidetector CT imaging of the chest was performed using the standard protocol during bolus administration of  intravenous contrast. Multiplanar CT image reconstructions and MIPs were obtained to evaluate the vascular anatomy. RADIATION DOSE REDUCTION: This exam was performed according to the departmental dose-optimization program which includes automated exposure control, adjustment of the mA and/or kV according to patient size and/or use of iterative reconstruction technique. CONTRAST:  75mL OMNIPAQUE  IOHEXOL  350 MG/ML SOLN COMPARISON:  Radiograph yesterday.  Chest CT 09/04/2023 FINDINGS: Cardiovascular: There are no filling defects within the pulmonary arteries to suggest pulmonary embolus. Mild cardiomegaly. There are coronary artery calcifications. Normal caliber thoracic aorta without acute aortic findings. Mild atherosclerosis. Contrast refluxes into the left jugular vein. Trace pericardial effusion. Mediastinum/Nodes: Esophageal dilatation consistent with known achalasia. Enteric tube tip below the diaphragm not included in this chest field of view. Mild fluid within the mid and distal esophagus, although diminished from prior exam. No bulky adenopathy. Lungs/Pleura: New moderate to large bilateral pleural effusions. Associated compressive atelectasis including complete atelectasis of left lower lobe and subtotal atelectasis of the right lower lobe. Geographic areas of ground-glass within the  aerated upper lobes with mild septal thickening are suspicious for pulmonary edema, although multifocal pneumonia is also considered. There is scattered debris within the trachea and central airways. Upper Abdomen: Small amount of upper abdominal ascites. Suggestion of gallbladder wall thickening. Musculoskeletal: Stable osseous structures. Unchanged T3 superior endplate compression deformity. Body wall edema is most prominent inferiorly extending into the flanks. Review of the MIP images confirms the above findings. IMPRESSION: 1. No pulmonary embolus. 2. New moderate to large bilateral pleural effusions with associated  compressive atelectasis including complete atelectasis of the left lower lobe and subtotal atelectasis of the right lower lobe. 3. Geographic areas of ground-glass within the aerated upper lobes with mild septal thickening are suspicious for pulmonary edema, although multifocal pneumonia is also considered. 4. Mild cardiomegaly with coronary artery calcifications. 5. Esophageal dilatation consistent with known achalasia. 6. Small amount of upper abdominal ascites. Suggestion of gallbladder wall thickening, nonspecific in this setting. 7. Body wall edema. Aortic Atherosclerosis (ICD10-I70.0). Electronically Signed   By: Andrea Gasman M.D.   On: 09/13/2023 15:39   DG Abd 1 View Result Date: 09/13/2023 CLINICAL DATA:  63 year old female status post nasogastric tube placement. EXAM: ABDOMEN - 1 VIEW COMPARISON:  09/09/2023. FINDINGS: Nasogastric tube extending into the stomach with side port approximately 9 cm distal to the gastroesophageal junction, and tip in the distal body of the stomach. Gas and stool are seen scattered throughout the colon extending to the level of the distal rectum. No pathologic distension of small bowel is noted. No gross evidence of pneumoperitoneum. IMPRESSION: 1. Support apparatus, as above. 2. Nonobstructive bowel-gas pattern. Electronically Signed   By: Toribio Aye M.D.   On: 09/13/2023 07:05   DG Chest Port 1 View Result Date: 09/12/2023 CLINICAL DATA:  242050 Oxygen desaturation [id number] EXAM: PORTABLE CHEST 1 VIEW COMPARISON:  09/08/2023 FINDINGS: Cardiomegaly. Aortic atherosclerosis. Enteric tube courses below the diaphragm with distal tip beyond the inferior margin of the film. Diffuse bilateral interstitial opacities, overall progressed. Previously seen airspace consolidation in the right perihilar region is improved. Probable small bilateral pleural effusions. No pneumothorax. IMPRESSION: 1. Diffuse bilateral interstitial opacities, overall progressed. Previously seen  airspace consolidation in the right perihilar region is improved. 2. Probable small bilateral pleural effusions. Electronically Signed   By: Mabel Converse D.O.   On: 09/12/2023 16:15   Medications: I have reviewed the patient's current medications. Scheduled Meds:  sodium chloride    Intravenous Once   Chlorhexidine  Gluconate Cloth  6 each Topical Daily   cyanocobalamin   1,000 mcg Intramuscular Daily   free water   125 mL Per Tube Q4H   furosemide   20 mg Intravenous Q12H   insulin  aspart  0-9 Units Subcutaneous Q6H   nutrition supplement (JUVEN)  1 packet Per Tube BID BM   mouth rinse  15 mL Mouth Rinse 4 times per day   pantoprazole  (PROTONIX ) IV  40 mg Intravenous Q12H   phosphorus  500 mg Per Tube Q4H   sodium chloride  flush  3 mL Intravenous Q12H   thiamine  (VITAMIN B1) injection  100 mg Intravenous Daily   Continuous Infusions:  feeding supplement (OSMOLITE 1.5 CAL) 1,000 mL (09/14/23 0737)   PRN Meds:.acetaminophen  **OR** acetaminophen , liver oil-zinc  oxide, ondansetron  **OR** ondansetron  (ZOFRAN ) IV, mouth rinse   Assessment: Principal Problem:   Severe sepsis (HCC) Active Problems:   Seizure disorder (HCC)   Achalasia   Pre-diabetes   Acute renal failure (HCC)   Acute encephalopathy   History of CVA (cerebrovascular accident)   Closed  wedge compression fracture of T3 vertebra (HCC)   Melena   Hemorrhagic shock (HCC)   Gastrointestinal hemorrhage   Lactic acidosis   Pressure injury of skin   Malnutrition of moderate degree   Upper GI bleed   Thrombocytopenia (HCC)   Hypophosphatemia   Acute hypoxic respiratory failure (HCC)    Plan: The patient had a Hemoccult sent off for some unknown reason since Hemoccult is part of a colon cancer screening program and has no utility especially in a patient who was found to have esophagitis gastritis and duodenitis less than a week ago.  The stools will remain Hemoccult positive for some time due to this previous bleeding.   I have spoken to the patient's daughter about scoping the patient again tomorrow to see if there is any particular site oozing more blood than the scope 6 days ago.  The daughter has agreed to us  proceeding with an upper endoscopy for tomorrow.   LOS: 10 days   Rogelia Jinny HAMMANS 09/14/2023, 2:17 PM Pager [phone] 7am-5pm  Check AMION for 5pm -7am coverage and on weekends

## 2023-09-14 NOTE — Progress Notes (Signed)
 Progress Note   Patient: Kaitlyn Hodges FMW:[id number] DOB: 02-06-1961 DOA: 09/04/2023     10 DOS: the patient was seen and examined on 09/14/2023   Brief hospital course: Kaitlyn Hodges is a 63 y.o. female with medical history significant of prediabetes, CVA, seizure disorder, type I achalasia,tobacco use disorder, erosive esophagitis who presents to the ED due to altered mental status.  Patient's daughter noted patient had dyspnea and cough for a month prior to admission and for the last week she had N/V and poor oral intake.  Patient was afebrile, hypotensive and tachycardic on vitals. She had a mild acidosis with increased AG. Noted lactate of 7.7. WBC of 32.9. BUN/cr 114/4.61. COVID, flu, RSV negative. Pan scan notable for possible L parotiditis, central airway thickening and patchy airspace opacities in the bases, age indeterminate T3 fracture.  Patient was started on broad spectrum antibiotics and fluid resuscitated.   Assessment and Plan: Severe sepsis. Community-acquired pneumonia. Acute hypoxic respiratory failure. B/l pleural effusions with compressive atelectasis. Patient presenting with hypotension, tachycardia, marked leukocytosis and lactic acidosis.  Patient's white count normal, lactic acid trended down to 2.8 Respiratory panel-negative. MRSA PCR negative. Off Bipap and Levophed , PCCM signed off. HFNC 40% tapered to 8L nasal cannula, saturating 98%. Continue to taper O2 as tolerated to maintain sat > 92%. Zosyn  changed to cefepime  due to possible DITP. Cefepime  finished 09/13/23. Urine cultures insignificant growth. Blood cultures so far negative. CTA chest done yesterday - negative for PE, new moderate to large bilateral pleural effusions with associated compressive atelectasis. IR guided thoracentesis ordered. IV Lasix  20mg  q12 ordered. Continue pulmonary toilet, out of bed to chair, incentive spirometry.   Hemorrhagic shock secondary to acute GI bleed Acute blood loss  anemia. S/p EGD on 09/08/2023, showed tortuous esophagus, diffuse inflammation in the esophagus and the small bowel which is not amenable to any endoscopic procedure. GI signed off. Continue PPI BID.  Patient received blood transfusion on 09/07/23. Continue NG feeds. Continue to monitor H/H closely. Given her low platelets she is at high risk for bleeding.  FOBT positive. Hb today 7.6. transfuse if Hb less than 7. GI follow up requested. Dr. Jinny notified.   AKI (acute kidney injury) (HCC) 2/2 hypovolemia and severe sepsis. In the setting of poor p.o. intake, severe uncontrolled achalasia. Renal function improving. Continue NG feeds, free water . Stopped IV fluids 09/11/23. Avoid nephrotoxic drugs.   Hypokalemia Hypophosphatemia- Improved. Pharmacy on board. Continue to replace electrolytes as needed.  Hypernatremia Sodium improved to 144. Trend Na. Stop IV fluids. Free water  125ml q4hr.  Thrombocytopenia: Hematology consult appreciated. 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 Zosyn  changed to cefepime . Suspicion for TTP, DIC is low. Possible DITP from zosyn . Platelet count dropped to 15 09/12/23, got 2 units of platelets.  Platelets today 33.  Given high risk for GI bleed, Hb drop, I am going to transfuse 2 more units of platelets.   Acute metabolic encephalopathy In the setting of sepsis, hypovolemia, hemorrhagic shock, electrolyte abnormalities.  Mental status still not baseline, more lethargic, oriented to self, speaks slowly. MRI brain done 1/9 showed dural thickening versus thin subdural collection overlying the left cerebral hemisphere, measuring up to 3-4 mm in thickness. Neurosurgery reviewed imaging no surgical indication. EEG showed moderate diffuse encephalopathy. No seizures or epileptiform discharges. Continue nursing supportive care. Neurochecks. Delirium precautions. Continue aspiration precaution. Advised palliative care consult, Daughter agreed.    Pre-diabetes Continue Accucheks with q6hr sliding scale, continue tube feeds. Hypoglycemia protocol.  Closed wedge compression fracture of T3 vertebra (HCC) Incidental finding of a T3 compression fracture on imaging.  She had fall 2 weeks ago. Continue pain management   History of CVA (cerebrovascular accident) CT of the head obtained showed chronic infarcts scattered along the left frontoparietal convexity in the left basal ganglia, as well as lacunar infarcts in the right thalamus. No acute findings. Will consider aspirin, statin once stable.   Seizure disorder (HCC) History of seizure disorder since CVA in 2010.  No reported seizures since that time. EEG showed moderate diffuse encephalopathy. No seizures or epileptiform discharges. Continue seizure precautions.  Nutrition Documentation    Flowsheet Row ED to Hosp-Admission (Current) from 09/04/2023 in [facility] ICU/CCU  Nutrition Problem Moderate Malnutrition  Etiology chronic illness  Nutrition Goal Patient will meet greater than or equal to 90% of their needs     ,  Active Pressure Injury/Wound(s)     Pressure Ulcer  Duration          Pressure Injury 09/06/23 Buttocks Medial Stage 2 -  Partial thickness loss of dermis presenting as a shallow open injury with a red, pink wound bed without slough. small stage 2, skin breakdown at top of gluteal cleft 2 days           Nursing supportive care. Fall, aspiration precautions. DVT prophylaxis SCD   Code Status: Full Code  Subjective: Patient is seen and examined today. She is lethargic, arousable, easily goes back to sleep. No family at bedside. RN notified that she is more awake today morning, dark loose stools, FOBT positive Physical Exam: Vitals:   09/14/23 1100 09/14/23 1142 09/14/23 1200 09/14/23 1300  BP: 98/68  103/62 107/66  Pulse: 82  90 86  Resp: 14  17 13   Temp:  98 F (36.7 C)    TempSrc:  Axillary    SpO2: 99%  100% 100%  Weight:       Height:        General - Elderly ill looking Caucasian female, sleeping no acute distress, on 8L nasal cannula. HEENT - PERRLA, EOMI, atraumatic head, non tender sinuses. Lung - Clear, diffuse rhonchi, no wheezes. Heart - S1, S2 heard, no murmurs, rubs, trace pedal edema. Abdomen - Soft, non tender, nondistended, bowel sounds good Neuro - sleepy and lethargic, unable to do full neuro exam, non focal exam Skin - Warm and dry.  Data Reviewed:      Latest Ref Rng & Units 09/14/2023    4:17 AM 09/13/2023    6:44 AM 09/13/2023    3:22 AM  CBC  WBC 4.0 - 10.5 K/uL 6.6  7.0  5.9   Hemoglobin 12.0 - 15.0 g/dL 7.6  9.6  8.8   Hematocrit 36.0 - 46.0 % 22.4  28.0  25.7   Platelets 150 - 400 K/uL 33  30  30       Latest Ref Rng & Units 09/14/2023    4:17 AM 09/13/2023    3:22 AM 09/12/2023    4:29 AM  BMP  Glucose 70 - 99 mg/dL 817  837  762   BUN 8 - 23 mg/dL 72  66  75   Creatinine 0.44 - 1.00 mg/dL 8.77  8.80  8.69   Sodium 135 - 145 mmol/L 144  143  143   Potassium 3.5 - 5.1 mmol/L 3.7  3.6  3.8   Chloride 98 - 111 mmol/L 109  105  102   CO2 22 - 32  mmol/L 28  28  29    Calcium 8.9 - 10.3 mg/dL 7.4  7.3  7.1    US  Venous Img Lower Bilateral (DVT) Result Date: 09/13/2023 CLINICAL DATA:  Elevated D-dimer. EXAM: BILATERAL LOWER EXTREMITY VENOUS DOPPLER ULTRASOUND TECHNIQUE: Gray-scale sonography with compression, as well as color and duplex ultrasound, were performed to evaluate the deep venous system(s) from the level of the common femoral vein through the popliteal and proximal calf veins. COMPARISON:  None Available. FINDINGS: VENOUS Normal compressibility of the common femoral, superficial femoral, and popliteal veins, as well as the visualized calf veins. Visualized portions of profunda femoral vein and great saphenous vein unremarkable. No filling defects to suggest DVT on grayscale or color Doppler imaging. Doppler waveforms show normal direction of venous flow, normal respiratory  plasticity and response to augmentation. OTHER None. Limitations: none IMPRESSION: No evidence of DVT in either lower extremity. Electronically Signed   By: Andrea Gasman M.D.   On: 09/13/2023 15:40   CT Angio Chest Pulmonary Embolism (PE) W or WO Contrast Result Date: 09/13/2023 CLINICAL DATA:  Pulmonary embolism (PE) suspected, high prob high d-dimer, hypoxia EXAM: CT ANGIOGRAPHY CHEST WITH CONTRAST TECHNIQUE: Multidetector CT imaging of the chest was performed using the standard protocol during bolus administration of intravenous contrast. Multiplanar CT image reconstructions and MIPs were obtained to evaluate the vascular anatomy. RADIATION DOSE REDUCTION: This exam was performed according to the departmental dose-optimization program which includes automated exposure control, adjustment of the mA and/or kV according to patient size and/or use of iterative reconstruction technique. CONTRAST:  75mL OMNIPAQUE  IOHEXOL  350 MG/ML SOLN COMPARISON:  Radiograph yesterday.  Chest CT 09/04/2023 FINDINGS: Cardiovascular: There are no filling defects within the pulmonary arteries to suggest pulmonary embolus. Mild cardiomegaly. There are coronary artery calcifications. Normal caliber thoracic aorta without acute aortic findings. Mild atherosclerosis. Contrast refluxes into the left jugular vein. Trace pericardial effusion. Mediastinum/Nodes: Esophageal dilatation consistent with known achalasia. Enteric tube tip below the diaphragm not included in this chest field of view. Mild fluid within the mid and distal esophagus, although diminished from prior exam. No bulky adenopathy. Lungs/Pleura: New moderate to large bilateral pleural effusions. Associated compressive atelectasis including complete atelectasis of left lower lobe and subtotal atelectasis of the right lower lobe. Geographic areas of ground-glass within the aerated upper lobes with mild septal thickening are suspicious for pulmonary edema, although multifocal  pneumonia is also considered. There is scattered debris within the trachea and central airways. Upper Abdomen: Small amount of upper abdominal ascites. Suggestion of gallbladder wall thickening. Musculoskeletal: Stable osseous structures. Unchanged T3 superior endplate compression deformity. Body wall edema is most prominent inferiorly extending into the flanks. Review of the MIP images confirms the above findings. IMPRESSION: 1. No pulmonary embolus. 2. New moderate to large bilateral pleural effusions with associated compressive atelectasis including complete atelectasis of the left lower lobe and subtotal atelectasis of the right lower lobe. 3. Geographic areas of ground-glass within the aerated upper lobes with mild septal thickening are suspicious for pulmonary edema, although multifocal pneumonia is also considered. 4. Mild cardiomegaly with coronary artery calcifications. 5. Esophageal dilatation consistent with known achalasia. 6. Small amount of upper abdominal ascites. Suggestion of gallbladder wall thickening, nonspecific in this setting. 7. Body wall edema. Aortic Atherosclerosis (ICD10-I70.0). Electronically Signed   By: Andrea Gasman M.D.   On: 09/13/2023 15:39   DG Abd 1 View Result Date: 09/13/2023 CLINICAL DATA:  63 year old female status post nasogastric tube placement. EXAM: ABDOMEN - 1 VIEW COMPARISON:  09/09/2023. FINDINGS: Nasogastric tube extending into the stomach with side port approximately 9 cm distal to the gastroesophageal junction, and tip in the distal body of the stomach. Gas and stool are seen scattered throughout the colon extending to the level of the distal rectum. No pathologic distension of small bowel is noted. No gross evidence of pneumoperitoneum. IMPRESSION: 1. Support apparatus, as above. 2. Nonobstructive bowel-gas pattern. Electronically Signed   By: Toribio Aye M.D.   On: 09/13/2023 07:05   DG Chest Port 1 View Result Date: 09/12/2023 CLINICAL DATA:  [id number]  Oxygen desaturation [id number] EXAM: PORTABLE CHEST 1 VIEW COMPARISON:  09/08/2023 FINDINGS: Cardiomegaly. Aortic atherosclerosis. Enteric tube courses below the diaphragm with distal tip beyond the inferior margin of the film. Diffuse bilateral interstitial opacities, overall progressed. Previously seen airspace consolidation in the right perihilar region is improved. Probable small bilateral pleural effusions. No pneumothorax. IMPRESSION: 1. Diffuse bilateral interstitial opacities, overall progressed. Previously seen airspace consolidation in the right perihilar region is improved. 2. Probable small bilateral pleural effusions. Electronically Signed   By: Mabel Converse D.O.   On: 09/12/2023 16:15    Family Discussion- Discussed with daughter over phone regarding her current care, he understands, all questions answered. Advised palliative care consult.  Disposition: Status is: Inpatient for severe electrolyte abnormalities, very high white count, high lactic acid Remains inpatient appropriate because: Stepdown unit  Planned Discharge Destination: Skilled nursing facility     MDM level 3-patient is sick presented with altered mentation, hemorrhagic shock, respiratory failure, GI bleed.  Patient's platelet count dropped, her mental status, kidney function is not baseline, hypoxia requiring HFNC.  She is at high risk for sudden clinical deterioration.  Author: Concepcion Riser, MD 09/14/2023 1:57 PM Secure chat 7am to 7pm For on call review [email or web address].

## 2023-09-14 NOTE — Progress Notes (Signed)
 Dr. Clide Dales on the floor, also made aware patient was more alert this am. Appears to be more drowsy now, still alert to self and place, appears weaker now than this morning. Per if hgb drops below 7.0 will transfuse PRBC

## 2023-09-14 NOTE — Plan of Care (Signed)
  Problem: Fluid Volume: Goal: Hemodynamic stability will improve Outcome: Progressing   Problem: Clinical Measurements: Goal: Diagnostic test results will improve Outcome: Progressing   Problem: Respiratory: Goal: Ability to maintain adequate ventilation will improve Outcome: Progressing   Problem: Clinical Measurements: Goal: Respiratory complications will improve Outcome: Progressing   Problem: Elimination: Goal: Will not experience complications related to urinary retention Outcome: Progressing   Problem: Fluid Volume: Goal: Ability to maintain a balanced intake and output will improve Outcome: Progressing   Problem: Metabolic: Goal: Ability to maintain appropriate glucose levels will improve Outcome: Progressing   Problem: Nutritional: Goal: Maintenance of adequate nutrition will improve Outcome: Progressing

## 2023-09-14 NOTE — Progress Notes (Addendum)
 Made Dr. Clide Dales aware bp running soft current bp 94/59 map 69. Hgb dropped from 9.6 yesterday to 7.6 today

## 2023-09-15 ENCOUNTER — Inpatient Hospital Stay: Payer: Medicaid Other | Admitting: Certified Registered"

## 2023-09-15 ENCOUNTER — Encounter: Payer: Self-pay | Admitting: Internal Medicine

## 2023-09-15 ENCOUNTER — Encounter
Admission: EM | Disposition: A | Discharge status: Skilled Nursing Facility | Payer: Self-pay | Source: Home / Self Care | Attending: Internal Medicine

## 2023-09-15 DIAGNOSIS — G9340 Encephalopathy, unspecified: Secondary | ICD-10-CM | POA: Diagnosis not present

## 2023-09-15 DIAGNOSIS — K2980 Duodenitis without bleeding: Secondary | ICD-10-CM | POA: Diagnosis not present

## 2023-09-15 DIAGNOSIS — K264 Chronic or unspecified duodenal ulcer with hemorrhage: Secondary | ICD-10-CM | POA: Diagnosis not present

## 2023-09-15 DIAGNOSIS — N179 Acute kidney failure, unspecified: Secondary | ICD-10-CM | POA: Diagnosis not present

## 2023-09-15 DIAGNOSIS — E538 Deficiency of other specified B group vitamins: Secondary | ICD-10-CM | POA: Diagnosis not present

## 2023-09-15 DIAGNOSIS — Z515 Encounter for palliative care: Secondary | ICD-10-CM

## 2023-09-15 DIAGNOSIS — K2289 Other specified disease of esophagus: Secondary | ICD-10-CM | POA: Diagnosis not present

## 2023-09-15 DIAGNOSIS — K2970 Gastritis, unspecified, without bleeding: Secondary | ICD-10-CM | POA: Diagnosis not present

## 2023-09-15 DIAGNOSIS — R6520 Severe sepsis without septic shock: Secondary | ICD-10-CM | POA: Diagnosis not present

## 2023-09-15 DIAGNOSIS — A419 Sepsis, unspecified organism: Secondary | ICD-10-CM | POA: Diagnosis not present

## 2023-09-15 HISTORY — PX: HOT HEMOSTASIS: SHX5433

## 2023-09-15 HISTORY — PX: ESOPHAGOGASTRODUODENOSCOPY (EGD) WITH PROPOFOL: SHX5813

## 2023-09-15 HISTORY — PX: HEMOSTASIS CLIP PLACEMENT: SHX6857

## 2023-09-15 LAB — GLUCOSE, CAPILLARY
Glucose-Capillary: 105 mg/dL — ABNORMAL HIGH (ref 70–99)
Glucose-Capillary: 177 mg/dL — ABNORMAL HIGH (ref 70–99)
Glucose-Capillary: 182 mg/dL — ABNORMAL HIGH (ref 70–99)
Glucose-Capillary: 73 mg/dL (ref 70–99)
Glucose-Capillary: 87 mg/dL (ref 70–99)

## 2023-09-15 LAB — BPAM PLATELET PHERESIS
Blood Product Expiration Date: 202501172359
Blood Product Expiration Date: 202501172359
ISSUE DATE / TIME: 202501141402
ISSUE DATE / TIME: 202501141555
Unit Type and Rh: 7300
Unit Type and Rh: 7300

## 2023-09-15 LAB — PREPARE PLATELET PHERESIS
Unit division: 0
Unit division: 0

## 2023-09-15 LAB — BASIC METABOLIC PANEL
Anion gap: 7 (ref 5–15)
BUN: 71 mg/dL — ABNORMAL HIGH (ref 8–23)
CO2: 29 mmol/L (ref 22–32)
Calcium: 7.6 mg/dL — ABNORMAL LOW (ref 8.9–10.3)
Chloride: 112 mmol/L — ABNORMAL HIGH (ref 98–111)
Creatinine, Ser: 1.07 mg/dL — ABNORMAL HIGH (ref 0.44–1.00)
GFR, Estimated: 58 mL/min — ABNORMAL LOW (ref >60)
Glucose, Bld: 81 mg/dL (ref 70–99)
Potassium: 3.5 mmol/L (ref 3.5–5.1)
Sodium: 148 mmol/L — ABNORMAL HIGH (ref 135–145)

## 2023-09-15 LAB — CBC
HCT: 22 % — ABNORMAL LOW (ref 36.0–46.0)
Hemoglobin: 7.1 g/dL — ABNORMAL LOW (ref 12.0–15.0)
MCH: 31 pg (ref 26.0–34.0)
MCHC: 32.3 g/dL (ref 30.0–36.0)
MCV: 96.1 fL (ref 80.0–100.0)
Platelets: 116 10*3/uL — ABNORMAL LOW (ref 150–400)
RBC: 2.29 MIL/uL — ABNORMAL LOW (ref 3.87–5.11)
RDW: 15.5 % (ref 11.5–15.5)
WBC: 6.9 10*3/uL (ref 4.0–10.5)
nRBC: 0 % (ref 0.0–0.2)

## 2023-09-15 LAB — PREPARE RBC (CROSSMATCH)

## 2023-09-15 LAB — PHOSPHORUS: Phosphorus: 3.9 mg/dL (ref 2.5–4.6)

## 2023-09-15 SURGERY — ESOPHAGOGASTRODUODENOSCOPY (EGD) WITH PROPOFOL
Anesthesia: General

## 2023-09-15 MED ORDER — LIDOCAINE HCL (PF) 2 % IJ SOLN
INTRAMUSCULAR | Status: AC
  Filled 2023-09-15: qty 5

## 2023-09-15 MED ORDER — PHENYLEPHRINE 80 MCG/ML (10ML) SYRINGE FOR IV PUSH (FOR BLOOD PRESSURE SUPPORT)
PREFILLED_SYRINGE | INTRAVENOUS | Status: AC
  Filled 2023-09-15: qty 10

## 2023-09-15 MED ORDER — FENTANYL CITRATE (PF) 100 MCG/2ML IJ SOLN
INTRAMUSCULAR | Status: AC
  Filled 2023-09-15: qty 2

## 2023-09-15 MED ORDER — PROPOFOL 10 MG/ML IV BOLUS
INTRAVENOUS | Status: AC
  Filled 2023-09-15: qty 40

## 2023-09-15 MED ORDER — VASOPRESSIN 20 UNIT/ML IV SOLN
INTRAVENOUS | Status: DC | PRN
  Administered 2023-09-15 (×2): 2 [IU] via INTRAVENOUS

## 2023-09-15 MED ORDER — SUCCINYLCHOLINE CHLORIDE 200 MG/10ML IV SOSY
PREFILLED_SYRINGE | INTRAVENOUS | Status: AC
Start: 2023-09-15 — End: ?
  Filled 2023-09-15: qty 10

## 2023-09-15 MED ORDER — ONDANSETRON HCL 4 MG/2ML IJ SOLN
INTRAMUSCULAR | Status: DC | PRN
  Administered 2023-09-15: 4 mg via INTRAVENOUS

## 2023-09-15 MED ORDER — SODIUM CHLORIDE 0.9 % IV SOLN: INTRAVENOUS | Status: DC | PRN

## 2023-09-15 MED ORDER — FENTANYL CITRATE (PF) 100 MCG/2ML IJ SOLN
INTRAMUSCULAR | Status: DC | PRN
  Administered 2023-09-15: 25 ug via INTRAVENOUS

## 2023-09-15 MED ORDER — DEXAMETHASONE SODIUM PHOSPHATE 10 MG/ML IJ SOLN
INTRAMUSCULAR | Status: DC | PRN
  Administered 2023-09-15: 10 mg via INTRAVENOUS

## 2023-09-15 MED ORDER — SODIUM CHLORIDE 0.9% IV SOLUTION: Freq: Once | INTRAVENOUS | Status: AC

## 2023-09-15 MED ORDER — LIDOCAINE HCL (PF) 2 % IJ SOLN
INTRAMUSCULAR | Status: DC | PRN
  Administered 2023-09-15: 100 mg via INTRADERMAL

## 2023-09-15 MED ORDER — PROPOFOL 10 MG/ML IV BOLUS
INTRAVENOUS | Status: DC | PRN
  Administered 2023-09-15: 100 mg via INTRAVENOUS

## 2023-09-15 MED ORDER — SUCCINYLCHOLINE CHLORIDE 200 MG/10ML IV SOSY
PREFILLED_SYRINGE | INTRAVENOUS | Status: DC | PRN
  Administered 2023-09-15: 100 mg via INTRAVENOUS

## 2023-09-15 MED ORDER — PHENYLEPHRINE 80 MCG/ML (10ML) SYRINGE FOR IV PUSH (FOR BLOOD PRESSURE SUPPORT)
PREFILLED_SYRINGE | INTRAVENOUS | Status: DC | PRN
  Administered 2023-09-15: 80 ug via INTRAVENOUS
  Administered 2023-09-15: 160 ug via INTRAVENOUS
  Administered 2023-09-15 (×3): 80 ug via INTRAVENOUS
  Administered 2023-09-15: 160 ug via INTRAVENOUS

## 2023-09-15 NOTE — Progress Notes (Signed)
 Progress Note    Kaitlyn Hodges  ZOX:[id number] DOB: 03/26/1961  DOA: 09/04/2023 PCP: Lamon Pillow, MD      Brief Narrative:    Medical records reviewed and are as summarized below:  Kaitlyn Hodges is a 63 y.o. female with medical history significant of prediabetes, CVA, seizure disorder, type I achalasia,tobacco use disorder, erosive esophagitis, who presented to the hospital with altered mental status.  Her daughter reported cough and shortness of breath for about a month prior to admission.  She also reported nausea, vomiting and poor oral intake for about a week prior to admission Patient was afebrile, hypotensive and tachycardic on vitals. She had a mild acidosis with increased AG. Noted lactate of 7.7. WBC of 32.9. BUN/cr 114/4.61. COVID, flu, RSV negative. Pan scan notable for possible L parotiditis, central airway thickening and patchy airspace opacities in the bases, age indeterminate T3 fracture.  Patient was started on broad spectrum antibiotics and fluid resuscitated.       Assessment/Plan:   Principal Problem:   Severe sepsis (HCC) Active Problems:   Acute renal failure (HCC)   Acute encephalopathy   Achalasia   Pre-diabetes   Seizure disorder (HCC)   History of CVA (cerebrovascular accident)   Closed wedge compression fracture of T3 vertebra (HCC)   Melena   Hemorrhagic shock (HCC)   Gastrointestinal hemorrhage   Lactic acidosis   Pressure injury of skin   Malnutrition of moderate degree   Upper GI bleed   Thrombocytopenia (HCC)   Hypophosphatemia   Acute hypoxic respiratory failure (HCC)   Nutrition Problem: Moderate Malnutrition Etiology: chronic illness  Signs/Symptoms: mild fat depletion, moderate fat depletion, mild muscle depletion, moderate muscle depletion   Body mass index is 29.96 kg/m.   Severe sepsis. Community-acquired pneumonia. Acute hypoxic respiratory failure. Improved. Respiratory panel-negative. MRSA PCR negative. Off  Bipap and Levophed , PCCM signed off. HFNC 40% tapered to 8L nasal cannula, saturating 98%. Continue to taper O2 as tolerated to maintain sat > 92%. Initially treated with IV Zosyn  but this was changed to IV cefepime  because of suspicion of drug-induced ITP.  Cefepime  finished 09/13/23. Urine cultures insignificant growth.  Blood cultures negative   B/l pleural effusions with compressive atelectasis. CTA chest done on 09/13/2023- negative for PE, new moderate to large bilateral pleural effusions with associated compressive atelectasis. IR guided thoracentesis ordered. Discontinue IV Lasix  because of hypernatremia. Continue pulmonary toilet, incentive spirometry as needed    Hemorrhagic shock secondary to acute GI bleed Acute blood loss anemia. S/p EGD on 09/08/2023, showed tortuous esophagus, diffuse inflammation in the esophagus and the small bowel which is not amenable to any endoscopic procedure.  Continue PPI BID.  Patient received blood transfusion on 09/07/23.  Hemoglobin dropped from 8-7.1. Patient will be transfused with another unit of PRBCs Plan for repeat EGD today.  Follow-up with Dr. Ole Berkeley, gastroenterologist.    AKI (acute kidney injury) [facility]) 2/2 hypovolemia and severe sepsis. In the setting of poor p.o. intake, severe uncontrolled achalasia. Renal function improving. Enteral nutrition and free water  as able Stopped IV fluids 09/11/23. Avoid nephrotoxic drugs.    Hypokalemia Hypophosphatemia- Improved.  Replete as needed per protocol.    Hypernatremia Sodium up again from 144-148. Discontinue IV Lasix . Continue free water     Thrombocytopenia: Improved.  Hematology consult appreciated. 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 Suspicion for TTP, DIC is low. Possible DITP from zosyn . S/p transfusion with 2 units of platelets on 09/14/2023, 2 units  of platelets on 07/12/2024 and 1 unit of platelet on 09/07/2023.      Acute metabolic encephalopathy Slowly  improving.  MRI brain done 1/9 showed dural thickening versus thin subdural collection overlying the left cerebral hemisphere, measuring up to 3-4 mm in thickness. Neurosurgery reviewed imaging.  No indication for surgery. EEG showed moderate diffuse encephalopathy. No seizures or epileptiform discharges. Continue supportive care.    Pre-diabetes Sliding scale as needed.   Hypoglycemia protocol.    Closed wedge compression fracture of T3 vertebra (HCC) Incidental finding of a T3 compression fracture on imaging.  S/p fall prior to admission Continue pain management    History of CVA (cerebrovascular accident) CT of the head obtained showed chronic infarcts scattered along the left frontoparietal convexity in the left basal ganglia, as well as lacunar infarcts in the right thalamus. No acute findings.  Will consider aspirin, statin once stable.    Seizure disorder (HCC) History of seizure disorder since CVA in 2010.  No reported seizures since that time. EEG showed moderate diffuse encephalopathy. No seizures or epileptiform discharges. Continue seizure precautions.    Diet Order             Diet NPO time specified  Diet effective midnight                            Consultants: Hematologist Intensivist Gastroenterologist  Procedures: EGD on 09/08/2023    Medications:    sodium chloride    Intravenous Once   Chlorhexidine  Gluconate Cloth  6 each Topical Daily   cyanocobalamin   1,000 mcg Intramuscular Daily   free water   125 mL Per Tube Q4H   furosemide   20 mg Intravenous Q12H   insulin  aspart  0-9 Units Subcutaneous Q6H   nutrition supplement (JUVEN)  1 packet Per Tube BID BM   mouth rinse  15 mL Mouth Rinse 4 times per day   pantoprazole  (PROTONIX ) IV  40 mg Intravenous Q12H   sodium chloride  flush  3 mL Intravenous Q12H   thiamine  (VITAMIN B1) injection  100 mg Intravenous Daily   Continuous Infusions:  feeding supplement (OSMOLITE 1.5 CAL) Stopped  (09/15/23 0000)     Anti-infectives (From admission, onward)    Start     Dose/Rate Route Frequency Ordered Stop   09/12/23 1345  ceFEPIme  (MAXIPIME ) 2 g in sodium chloride  0.9 % 100 mL IVPB       Note to Pharmacy: Pharmacy to dose, heme/ onc advised to dc zosyn  in the setting of dropping platelets   2 g 200 mL/hr over 30 Minutes Intravenous Every 12 hours 09/12/23 1255 09/14/23 0118   09/11/23 1315  ceFEPIme  (MAXIPIME ) 1 g in sodium chloride  0.9 % 100 mL IVPB  Status:  Discontinued       Note to Pharmacy: Pharmacy to dose, heme/ onc advised to dc zosyn  in the setting of dropping platelets   1 g 200 mL/hr over 30 Minutes Intravenous Every 12 hours 09/11/23 1222 09/12/23 1255   09/08/23 1800  piperacillin -tazobactam (ZOSYN ) IVPB 3.375 g  Status:  Discontinued        3.375 g 12.5 mL/hr over 240 Minutes Intravenous Every 8 hours 09/08/23 1629 09/11/23 1222   09/08/23 1800  vancomycin  (VANCOREADY) IVPB 1500 mg/300 mL  Status:  Discontinued        1,500 mg 150 mL/hr over 120 Minutes Intravenous Every 48 hours 09/08/23 1629 09/09/23 0829   09/08/23 1702  vancomycin  variable dose per  unstable renal function (pharmacist dosing)  Status:  Discontinued         Does not apply See admin instructions 09/08/23 1702 09/09/23 0829   09/08/23 1000  cefTRIAXone  (ROCEPHIN ) 2 g in sodium chloride  0.9 % 100 mL IVPB  Status:  Discontinued        2 g 200 mL/hr over 30 Minutes Intravenous Every 24 hours 09/08/23 0852 09/08/23 1616   09/07/23 1000  doxycycline  (VIBRAMYCIN ) 100 mg in dextrose  5 % 250 mL IVPB  Status:  Discontinued        100 mg 125 mL/hr over 120 Minutes Intravenous Every 12 hours 09/07/23 0836 09/08/23 1616   09/07/23 0800  ceFEPIme  (MAXIPIME ) 2 g in sodium chloride  0.9 % 100 mL IVPB  Status:  Discontinued        2 g 200 mL/hr over 30 Minutes Intravenous Every 12 hours 09/07/23 0534 09/07/23 0538   09/07/23 0800  ceFEPIme  (MAXIPIME ) 2 g in sodium chloride  0.9 % 100 mL IVPB  Status:   Discontinued        2 g 200 mL/hr over 30 Minutes Intravenous Every 12 hours 09/07/23 0538 09/08/23 0852   09/06/23 1430  doxycycline  (VIBRA -TABS) tablet 100 mg  Status:  Discontinued        100 mg Oral Every 12 hours 09/06/23 1418 09/07/23 0836   09/05/23 1200  ceFEPIme  (MAXIPIME ) 2 g in sodium chloride  0.9 % 100 mL IVPB  Status:  Discontinued        2 g 200 mL/hr over 30 Minutes Intravenous Every 24 hours 09/04/23 1450 09/05/23 0956   09/05/23 1000  cefTRIAXone  (ROCEPHIN ) 2 g in sodium chloride  0.9 % 100 mL IVPB  Status:  Discontinued        2 g 200 mL/hr over 30 Minutes Intravenous Every 24 hours 09/05/23 0956 09/07/23 0527   09/04/23 2200  metroNIDAZOLE  (FLAGYL ) IVPB 500 mg  Status:  Discontinued        500 mg 100 mL/hr over 60 Minutes Intravenous Every 12 hours 09/04/23 1416 09/08/23 1616   09/04/23 1600  vancomycin  (VANCOREADY) IVPB 750 mg/150 mL        750 mg 150 mL/hr over 60 Minutes Intravenous  Once 09/04/23 1450 09/04/23 1611   09/04/23 1450  vancomycin  variable dose per unstable renal function (pharmacist dosing)  Status:  Discontinued         Does not apply See admin instructions 09/04/23 1450 09/06/23 1418   09/04/23 1130  ceFEPIme  (MAXIPIME ) 2 g in sodium chloride  0.9 % 100 mL IVPB        2 g 200 mL/hr over 30 Minutes Intravenous  Once 09/04/23 1123 09/04/23 1233   09/04/23 1130  metroNIDAZOLE  (FLAGYL ) IVPB 500 mg        500 mg 100 mL/hr over 60 Minutes Intravenous  Once 09/04/23 1123 09/04/23 1318   09/04/23 1130  vancomycin  (VANCOCIN ) IVPB 1000 mg/200 mL premix        1,000 mg 200 mL/hr over 60 Minutes Intravenous  Once 09/04/23 1123 09/04/23 1349              Family Communication/Anticipated D/C date and plan/Code Status   DVT prophylaxis:      Code Status: Full Code  Family Communication: None Disposition Plan: Likely discharge to SNF   Status is: Inpatient Remains inpatient appropriate because: Respiratory failure, sepsis      Subjective:    Interval events noted.  She has no complaints.  She feels a little better.  No shortness of breath or chest pain.  Objective:    Vitals:   09/15/23 0700 09/15/23 0715 09/15/23 0745 09/15/23 0800  BP: (!) 99/55  104/60 98/60  Pulse: 77 97 72 82  Resp: 12  12 12   Temp:  97.8 F (36.6 C) (!) 97.4 F (36.3 C)   TempSrc:  Axillary Axillary   SpO2: 100%  100% 100%  Weight:      Height:       No data found.   Intake/Output Summary (Last 24 hours) at 09/15/2023 9528 Last data filed at 09/15/2023 0400 Gross per 24 hour  Intake 1610.22 ml  Output 2300 ml  Net -689.78 ml   Filed Weights   09/13/23 0500 09/14/23 0500 09/15/23 0500  Weight: 84.4 kg 84.4 kg 84.2 kg    Exam:  GEN: NAD SKIN: Bruises on b/l upper extremities EYES: Pale but anicteric ENT: MMM CV: RRR PULM: CTA B ABD: soft, ND, NT, +BS CNS: AAO x 2 (person and place), non focal EXT: B/l leg edema, no tenderness Rectal tube with black liquid stools    Data Reviewed:   I have personally reviewed following labs and imaging studies:  Labs: Labs show the following:   Basic Metabolic Panel: Recent Labs  Lab 09/09/23 0724 09/09/23 1356 09/10/23 0608 09/11/23 0213 09/12/23 0429 09/13/23 0322 09/14/23 0417 09/15/23 0418  NA 150*  --  151* 149* 143 143 144 148*  K 2.5*   < > 3.1* 3.2* 3.8 3.6 3.7 3.5  CL 115*  --  116* 113* 102 105 109 112*  CO2 12*  --  24 29 29 28 28 29   GLUCOSE 87  --  179* 200* 237* 162* 182* 81  BUN 77*  --  76* 81* 75* 66* 72* 71*  CREATININE 1.79*  --  1.53* 1.37* 1.30* 1.19* 1.22* 1.07*  CALCIUM 7.7*  --  7.3* 7.2* 7.1* 7.3* 7.4* 7.6*  MG 1.9  --  1.8 1.7 2.1  --  1.8  --   PHOS 2.9  --  1.1* 1.6* 2.7  --  2.4* 3.9   < > = values in this interval not displayed.   GFR Estimated Creatinine Clearance: 58.9 mL/min (A) (by C-G formula based on SCr of 1.07 mg/dL (H)). Liver Function Tests: Recent Labs  Lab 09/11/23 0213 09/13/23 0322  AST 17 26  ALT 16 18  ALKPHOS 158*  151*  BILITOT 1.1 0.6  PROT 4.1* 4.5*  ALBUMIN  1.8* 1.6*   No results for input(s): "LIPASE", "AMYLASE" in the last 168 hours. No results for input(s): "AMMONIA" in the last 168 hours. Coagulation profile Recent Labs  Lab 09/11/23 1337  INR 1.4*    CBC: Recent Labs  Lab 09/08/23 2141 09/09/23 0724 09/09/23 1053 09/09/23 1545 09/11/23 0213 09/12/23 1503 09/13/23 0322 09/13/23 0644 09/14/23 0417 09/14/23 1558 09/15/23 0418  WBC 21.5* 22.8* 22.4* 23.4*   < > 6.1 5.9 7.0 6.6  --  6.9  NEUTROABS 20.7* 20.8* 21.3* 22.1*  --  5.2  --   --   --   --   --   HGB 12.0 13.4 12.8 14.1   < > 10.6* 8.8* 9.6* 7.6* 8.0* 7.1*  HCT 35.2* 39.6 36.5 40.9   < > 32.1* 25.7* 28.0* 22.4* 24.4* 22.0*  MCV 93.4 92.5 91.0 90.7   < > 92.2 90.8 91.2 90.7  --  96.1  PLT 63* 58* 53* 53*   < > 44* 30* 30* 33*  --  116*   < > = values in this interval not displayed.   Cardiac Enzymes: No results for input(s): "CKTOTAL", "CKMB", "CKMBINDEX", "TROPONINI" in the last 168 hours. BNP (last 3 results) No results for input(s): "PROBNP" in the last 8760 hours. CBG: Recent Labs  Lab 09/14/23 0554 09/14/23 1221 09/14/23 1751 09/15/23 0023 09/15/23 0611  GLUCAP 180* 229* 198* 182* 73   D-Dimer: Recent Labs    09/12/23 1915  DDIMER 6.87*   Hgb A1c: No results for input(s): "HGBA1C" in the last 72 hours. Lipid Profile: No results for input(s): "CHOL", "HDL", "LDLCALC", "TRIG", "CHOLHDL", "LDLDIRECT" in the last 72 hours. Thyroid  function studies: No results for input(s): "TSH", "T4TOTAL", "T3FREE", "THYROIDAB" in the last 72 hours.  Invalid input(s): "FREET3" Anemia work up: Recent Labs    09/12/23 1503  RETICCTPCT <0.4*   Sepsis Labs: Recent Labs  Lab 09/08/23 1529 09/08/23 2141 09/09/23 0724 09/09/23 0725 09/09/23 1053 09/09/23 1545 09/09/23 1830 09/11/23 0213 09/13/23 0322 09/13/23 0644 09/14/23 0417 09/15/23 0418  PROCALCITON  --   --  5.25  --   --   --   --   --   --   --    --   --   WBC 21.8*   < > 22.8*  --    < > 23.4*  --    < > 5.9 7.0 6.6 6.9  LATICACIDVEN 6.5*  --   --  8.4*  --  7.2* 2.3*  --   --   --   --   --    < > = values in this interval not displayed.    Microbiology Recent Results (from the past 240 hours)  MRSA Next Gen by PCR, Nasal     Status: None   Collection Time: 09/06/23  8:33 AM   Specimen: Nasal Mucosa; Nasal Swab  Result Value Ref Range Status   MRSA by PCR Next Gen NOT DETECTED NOT DETECTED Final    Comment: (NOTE) The GeneXpert MRSA Assay (FDA approved for NASAL specimens only), is one component of a comprehensive MRSA colonization surveillance program. It is not intended to diagnose MRSA infection nor to guide or monitor treatment for MRSA infections. Test performance is not FDA approved in patients less than 32 years old. Performed at [facility], [address] [place], [place] [address]   MRSA Next Gen by PCR, Nasal     Status: None   Collection Time: 09/08/23  4:20 PM   Specimen: Nasal Mucosa; Nasal Swab  Result Value Ref Range Status   MRSA by PCR Next Gen NOT DETECTED NOT DETECTED Final    Comment: (NOTE) The GeneXpert MRSA Assay (FDA approved for NASAL specimens only), is one component of a comprehensive MRSA colonization surveillance program. It is not intended to diagnose MRSA infection nor to guide or monitor treatment for MRSA infections. Test performance is not FDA approved in patients less than 93 years old. Performed at [facility], [address] [place], [place] [address]     Procedures and diagnostic studies:  US  Venous Img Lower Bilateral (DVT) Result Date: 09/13/2023 CLINICAL DATA:  Elevated D-dimer. EXAM: BILATERAL LOWER EXTREMITY VENOUS DOPPLER ULTRASOUND TECHNIQUE: Gray-scale sonography with compression, as well as color and duplex ultrasound, were performed to evaluate the deep venous system(s) from the level of the common femoral vein through the popliteal and  proximal calf veins. COMPARISON:  None Available. FINDINGS: VENOUS Normal compressibility of the common femoral, superficial femoral, and popliteal veins,  as well as the visualized calf veins. Visualized portions of profunda femoral vein and great saphenous vein unremarkable. No filling defects to suggest DVT on grayscale or color Doppler imaging. Doppler waveforms show normal direction of venous flow, normal respiratory plasticity and response to augmentation. OTHER None. Limitations: none IMPRESSION: No evidence of DVT in either lower extremity. Electronically Signed   By: Chadwick Colonel M.D.   On: 09/13/2023 15:40   CT Angio Chest Pulmonary Embolism (PE) W or WO Contrast Result Date: 09/13/2023 CLINICAL DATA:  Pulmonary embolism (PE) suspected, high prob high d-dimer, hypoxia EXAM: CT ANGIOGRAPHY CHEST WITH CONTRAST TECHNIQUE: Multidetector CT imaging of the chest was performed using the standard protocol during bolus administration of intravenous contrast. Multiplanar CT image reconstructions and MIPs were obtained to evaluate the vascular anatomy. RADIATION DOSE REDUCTION: This exam was performed according to the departmental dose-optimization program which includes automated exposure control, adjustment of the mA and/or kV according to patient size and/or use of iterative reconstruction technique. CONTRAST:  75mL OMNIPAQUE  IOHEXOL  350 MG/ML SOLN COMPARISON:  Radiograph yesterday.  Chest CT 09/04/2023 FINDINGS: Cardiovascular: There are no filling defects within the pulmonary arteries to suggest pulmonary embolus. Mild cardiomegaly. There are coronary artery calcifications. Normal caliber thoracic aorta without acute aortic findings. Mild atherosclerosis. Contrast refluxes into the left jugular vein. Trace pericardial effusion. Mediastinum/Nodes: Esophageal dilatation consistent with known achalasia. Enteric tube tip below the diaphragm not included in this chest field of view. Mild fluid within the mid  and distal esophagus, although diminished from prior exam. No bulky adenopathy. Lungs/Pleura: New moderate to large bilateral pleural effusions. Associated compressive atelectasis including complete atelectasis of left lower lobe and subtotal atelectasis of the right lower lobe. Geographic areas of ground-glass within the aerated upper lobes with mild septal thickening are suspicious for pulmonary edema, although multifocal pneumonia is also considered. There is scattered debris within the trachea and central airways. Upper Abdomen: Small amount of upper abdominal ascites. Suggestion of gallbladder wall thickening. Musculoskeletal: Stable osseous structures. Unchanged T3 superior endplate compression deformity. Body wall edema is most prominent inferiorly extending into the flanks. Review of the MIP images confirms the above findings. IMPRESSION: 1. No pulmonary embolus. 2. New moderate to large bilateral pleural effusions with associated compressive atelectasis including complete atelectasis of the left lower lobe and subtotal atelectasis of the right lower lobe. 3. Geographic areas of ground-glass within the aerated upper lobes with mild septal thickening are suspicious for pulmonary edema, although multifocal pneumonia is also considered. 4. Mild cardiomegaly with coronary artery calcifications. 5. Esophageal dilatation consistent with known achalasia. 6. Small amount of upper abdominal ascites. Suggestion of gallbladder wall thickening, nonspecific in this setting. 7. Body wall edema. Aortic Atherosclerosis (ICD10-I70.0). Electronically Signed   By: Chadwick Colonel M.D.   On: 09/13/2023 15:39               LOS: 11 days   Sabrie Moritz  [facility]   Pager on [email or web address]. If 7PM-7AM, please contact night-coverage at [email or web address]     09/15/2023, 9:23 AM

## 2023-09-15 NOTE — Consult Note (Signed)
Consultation Note Date: 09/15/2023 at 1100  Patient Name: Kaitlyn Hodges  DOB: [birth year]  MRN: [id number]  Age / Sex: 63 y.o., female  PCP: Kaitlyn Limes, MD Referring Physician: Lurene Shadow, MD  HPI/Patient Profile: 63 y.o. female  with past medical history of CVA (2010), seizure disorder, prediabetes, type I achalasia, tobacco use, and erosive esophagitis admitted on 09/04/2023 with AMS.  As per chart review, daughter endorsed cough and shortness of breath for about a month PTA.  Daughter also endorsed nausea with vomiting and poor oral intake for approximately 1 week PTA.  Course of hospitalization has revealed a pan scan notable for possible left parotiditis, central airway thickening, and patchy airspace opacities in the bases with an age indeterminate T3 fracture.  Patient's respiratory panel was negative.  She is being treated for community-acquired ammonia with acute hypoxic respiratory failure, bilateral pleural effusions with compressive atelectasis, hemorrhagic shock secondary to acute GI bleed with acute blood loss anemia, AKI, thrombocytopenia disease hematology consulted), and acute metabolic encephalopathy.  MRI of brain revealed dural thickening versus thin subdural collection overlying the left cerebral hemisphere, measuring up to 3-4 mm in thickness.  Neurosurgery reviewed imaging and recommended no indication for surgery at this time.  EEG revealed moderate diffuse encephalopathy with no seizures or epileptiform discharges noted.  CT of head revealed chronic infarcts but no acute findings.  PMT was consulted to discuss boundaries and goals of care.  Clinical Assessment and Goals of Care: Extensive chart review completed prior to meeting patient including labs, vital signs, imaging, progress notes, orders, and available advanced directive documents from current and previous encounters. I  then met with patient at bedside to discuss diagnosis prognosis, GOC, EOL wishes, disposition and options.  I introduced Palliative Medicine as specialized medical care for people living with serious illness. It focuses on providing relief from the symptoms and stress of a serious illness. The goal is to improve quality of life for both the patient and the family.  Patient is able to acknowledge my presence and make eye contact.  She shares yes/no answers appropriately.  However, she is not able to make other sentences or respond to open-ended questions.  During my visit, her pastor came to visit.  She recognized him.  During my assessment of patient's understanding of her current medical situation, patient's pastor began to answer for her.  I question patient's ability to understand her current medical status.  Symptoms assessed.  Patient has no acute complaints at this time.  She denies headache, chest pain, nausea/vomiting, or other acute issues at this time.  I counseled with RNs.  No adjustment to [facility] needed at this time.  After visiting with the patient, I spoke with patient's daughter Kaitlyn Hodges over the phone.  Brief medical update given.  Kaitlyn Hodges has a clear understanding of patient's current medical situation.  I attempted to elicit values and goals important to the patient.  Kaitlyn Hodges shares that the patient has never made her wishes known either verbally or in advance care planning documentation.  Kaitlyn Hodges shares that she and her brother know for certain that their mother would never want to live long-term on a ventilator or artificial life support.  She and her brother remain in agreement for full code and full scope with understanding that patient would never be accepting of trach/PEG.  Space and opportunity provided for Kaitlyn Hodges to ask questions and voiced her concerns regarding her mother's current hospitalization.  Kaitlyn Hodges ask if patient could have a feeding tube and how permanent a  feeding tube is.  Pros and cons of feeding tube reviewed.  Risk and benefits of artificial nutrition discussed.  Questions and concerns were addressed.  Kaitlyn Hodges shares that she believes her mother would be accepting of a feeding tube with the understanding they would be hopeful that she would have it removed and able to eat naturally on her own.  Though unprompted, Kaitlyn Hodges also shares that she thinks the patient would be open to accepting hemodialysis if that should become necessary.  Discussed that current plan is to treat the treatable, and see how patient improves.  Kaitlyn Hodges shares appreciation for my call.  PMT contact info given for Kaitlyn Hodges and family to call with any acute palliative needs.  Full code and full scope remain.  PMT will continue to follow and support patient and family throughout her hospitalization.  Primary Decision Maker NEXT OF KIN  Physical Exam Vitals reviewed.  Constitutional:      General: She is not in acute distress.    Appearance: She is normal weight.  HENT:     Head: Normocephalic.     Mouth/Throat:     Mouth: Mucous membranes are moist.  Eyes:     Pupils: Pupils are equal, round, and reactive to light.  Pulmonary:     Effort: Pulmonary effort is normal.  Abdominal:     Palpations: Abdomen is soft.  Skin:    General: Skin is warm and dry.  Neurological:     Mental Status: She is alert.     Comments: Oriented to self and place  Psychiatric:        Mood and Affect: Mood normal.        Behavior: Behavior normal.     Palliative Assessment/Data: 40-50%     Thank you for this consult. Palliative medicine will continue to follow and assist holistically.   Time Total: 75 minutes  Time spent includes: Detailed review of medical records (labs, imaging, vital signs), medically appropriate exam (mental status, respiratory, cardiac, skin), discussed with treatment team, counseling and educating patient, family and staff, documenting clinical  information, medication management and coordination of care.  Signed by: Kaitlyn Cocker, DNP, FNP-BC Palliative Medicine   Please contact Palliative Medicine Team providers via [facility] for questions and concerns.

## 2023-09-15 NOTE — Transfer of Care (Signed)
 Immediate Anesthesia Transfer of Care Note  Patient: Kaitlyn Hodges  Procedure(s) Performed: ESOPHAGOGASTRODUODENOSCOPY (EGD) WITH PROPOFOL  HEMOSTASIS CLIP PLACEMENT HOT HEMOSTASIS (ARGON PLASMA COAGULATION/BICAP)  Patient Location: Endoscopy Unit  Anesthesia Type:General  Level of Consciousness: drowsy  Airway & Oxygen Therapy: Patient Spontanous Breathing and Patient connected to face mask oxygen  Post-op Assessment: Report given to RN and Post -op Vital signs reviewed and stable  Post vital signs: Reviewed and stable  Last Vitals:  Vitals Value Taken Time  BP 105/68 09/15/23 1353  Temp 35.9 1353  Pulse 75 09/15/23 1356  Resp 20 1355  SpO2 94% 09/15/23 1355  Vitals shown include unfiled device data.  Last Pain:  Vitals:   09/15/23 1136  TempSrc: Axillary  PainSc:       Patients Stated Pain Goal: 0 (09/15/23 0400)  Complications: No notable events documented.

## 2023-09-15 NOTE — Op Note (Signed)
 [facility] Gastroenterology Patient Name: Kaitlyn Hodges Procedure Date: 09/15/2023 1:41 PM MRN: [id number] Account #: [id number] Date of Birth: 12-Aug-1961 Admit Type: Inpatient Age: 63 Room: [facility] ENDO ROOM 4 Gender: Female Note Status: Finalized Instrument Name: Upper Endoscope [id number] Procedure:             Upper GI endoscopy Indications:           Acute post hemorrhagic anemia Providers:             Marnee Sink MD, MD Medicines:             General Anesthesia Complications:         No immediate complications. Procedure:             Pre-Anesthesia Assessment:                        - Prior to the procedure, a History and Physical was                         performed, and patient medications and allergies were                         reviewed. The patient's tolerance of previous                         anesthesia was also reviewed. The risks and benefits                         of the procedure and the sedation options and risks                         were discussed with the patient. All questions were                         answered, and informed consent was obtained. Prior                         Anticoagulants: The patient has taken no anticoagulant                         or antiplatelet agents. ASA Grade Assessment: III - A                         patient with severe systemic disease. After reviewing                         the risks and benefits, the patient was deemed in                         satisfactory condition to undergo the procedure.                        After obtaining informed consent, the endoscope was                         passed under direct vision. Throughout the procedure,                         the patient's  blood pressure, pulse, and oxygen                         saturations were monitored continuously. The Endoscope                         was introduced through the mouth, and advanced to the                         second part of  duodenum. The upper GI endoscopy was                         accomplished without difficulty. The patient tolerated                         the procedure well. Findings:      The examined esophagus was significantly tortuous.      The lumen of the esophagus was severely dilated.      Localized mild inflammation was found in the gastric antrum.      Diffuse severe inflammation characterized by shallow ulcerations was       found in the duodenal bulb.      One oozing superficial duodenal ulcer with a visible vessel was found in       the second portion of the duodenum. For hemostasis, two hemostatic clips       were successfully placed (MR conditional). Clip manufacturer: [employer]. There was no bleeding at the end of the procedure.       Coagulation for hemostasis using argon plasma at 2 liters/minute and 20       watts was successful. Impression:            - Tortuous esophagus.                        - Dilation in the entire esophagus.                        - Gastritis.                        - Duodenitis.                        - Oozing duodenal ulcer with a visible vessel. Clips                         (MR conditional) were placed. Clip manufacturer:                         [employer]. Treated with argon plasma                         coagulation (APC).                        - No specimens collected. Recommendation:        - Return patient to ICU for ongoing care.                        - Resume previous diet.                        -  Continue present medications. Procedure Code(s):     --- Professional ---                        [phone], Esophagogastroduodenoscopy, flexible,                         transoral; with control of bleeding, any method Diagnosis Code(s):     --- Professional ---                        D62, Acute posthemorrhagic anemia                        K26.4, Chronic or unspecified duodenal ulcer with                         hemorrhage CPT  copyright 2022 American Medical Association. All rights reserved. The codes documented in this report are preliminary and upon coder review may  be revised to meet current compliance requirements. Marnee Sink MD, MD 09/15/2023 2:34:14 PM This report has been signed electronically. Number of Addenda: 0 Note Initiated On: 09/15/2023 1:41 PM Estimated Blood Loss:  Estimated blood loss: none.      [facility]

## 2023-09-15 NOTE — Anesthesia Preprocedure Evaluation (Signed)
 Anesthesia Evaluation  Patient identified by MRN, date of birth, ID band Patient awake    Reviewed: Allergy & Precautions, NPO status , Patient's Chart, lab work & pertinent test results  History of Anesthesia Complications Negative for: history of anesthetic complications  Airway Mallampati: III  TM Distance: >3 FB Neck ROM: full    Dental  (+) Poor Dentition, Missing   Pulmonary pneumonia, unresolved, Current Smoker   Pulmonary exam normal        Cardiovascular negative cardio ROS Normal cardiovascular exam     Neuro/Psych Seizures -, Well Controlled,  PSYCHIATRIC DISORDERS  Depression    CVA    GI/Hepatic Neg liver ROS, PUD,,,  Endo/Other  negative endocrine ROS    Renal/GU ARFRenal disease  negative genitourinary   Musculoskeletal   Abdominal   Peds  Hematology  (+) Blood dyscrasia, anemia   Anesthesia Other Findings Past Medical History: No date: Kidney stones No date: Stroke [facility])  Past Surgical History: 2003: ABDOMINAL HYSTERECTOMY     Comment:  Total; Polycystic ovarian Disease 01/17/2020: BIOPSY     Comment:  Procedure: BIOPSY;  Surgeon: Sergio Dandy, MD;                Location: [facility] ENDOSCOPY;  Service: Endoscopy;; 09/08/2023: ESOPHAGOGASTRODUODENOSCOPY; N/A     Comment:  Procedure: ESOPHAGOGASTRODUODENOSCOPY (EGD);  Surgeon:               Marnee Sink, MD;  Location[facility] ENDOSCOPY;  Service:               Endoscopy;  Laterality: N/A; 12/14/2019: ESOPHAGOGASTRODUODENOSCOPY (EGD) WITH PROPOFOL ; N/A     Comment:  Procedure: ESOPHAGOGASTRODUODENOSCOPY (EGD) WITH               PROPOFOL ;  Surgeon: Luke Salaam, MD;  Location: [facility]               ENDOSCOPY;  Service: Gastroenterology;  Laterality: N/A; 01/17/2020: ESOPHAGOGASTRODUODENOSCOPY (EGD) WITH PROPOFOL ; N/A     Comment:  Procedure: ESOPHAGOGASTRODUODENOSCOPY (EGD) WITH               PROPOFOL ;  Surgeon: Sergio Dandy, MD;  Location:                [facility] ENDOSCOPY;  Service: Endoscopy;  Laterality: N/A;                Gastric Lavage 01/17/2020: FOREIGN BODY REMOVAL     Comment:  Procedure: FOREIGN BODY REMOVAL;  Surgeon: Sergio Dandy, MD;  Location: [facility] ENDOSCOPY;  Service:               Endoscopy;;  Food impaction in Esophagus  BMI    Body Mass Index: 29.96 kg/m      Reproductive/Obstetrics negative OB ROS                             Anesthesia Physical Anesthesia Plan  ASA: 3  Anesthesia Plan: General   Post-op Pain Management: Minimal or no pain anticipated   Induction: Intravenous  PONV Risk Score and Plan: 2 and Propofol  infusion and TIVA  Airway Management Planned: Natural Airway and Nasal Cannula  Additional Equipment:   Intra-op Plan:   Post-operative Plan:   Informed Consent: I have reviewed the patients History and Physical, chart, labs and discussed the procedure including the risks, benefits and alternatives for  the proposed anesthesia with the patient or authorized representative who has indicated his/her understanding and acceptance.     Dental Advisory Given  Plan Discussed with: Anesthesiologist, CRNA and Surgeon  Anesthesia Plan Comments: (Patient consented for risks of anesthesia including but not limited to:  - adverse reactions to medications - risk of airway placement if required - damage to eyes, teeth, lips or other oral mucosa - nerve damage due to positioning  - sore throat or hoarseness - Damage to heart, brain, nerves, lungs, other parts of body or loss of life  Patient voiced understanding and assent.)        Anesthesia Quick Evaluation

## 2023-09-15 NOTE — Plan of Care (Signed)
  Problem: Fluid Volume: Goal: Hemodynamic stability will improve Outcome: Progressing   Problem: Respiratory: Goal: Ability to maintain adequate ventilation will improve Outcome: Progressing   Problem: Clinical Measurements: Goal: Cardiovascular complication will be avoided Outcome: Progressing   Problem: Coping: Goal: Level of anxiety will decrease Outcome: Progressing   Problem: Elimination: Goal: Will not experience complications related to bowel motility Outcome: Progressing Goal: Will not experience complications related to urinary retention Outcome: Progressing   Problem: Pain Management: Goal: General experience of comfort will improve Outcome: Progressing   Problem: Safety: Goal: Ability to remain free from injury will improve Outcome: Progressing   Problem: Fluid Volume: Goal: Ability to maintain a balanced intake and output will improve Outcome: Progressing   Problem: Metabolic: Goal: Ability to maintain appropriate glucose levels will improve Outcome: Progressing   Problem: Tissue Perfusion: Goal: Adequacy of tissue perfusion will improve Outcome: Progressing

## 2023-09-15 NOTE — Anesthesia Procedure Notes (Signed)
 Procedure Name: Intubation Date/Time: 09/15/2023 1:20 PM  Performed by: Ellwood Haber, CRNAPre-anesthesia Checklist: Patient identified, Patient being monitored, Timeout performed, Emergency Drugs available and Suction available Patient Re-evaluated:Patient Re-evaluated prior to induction Oxygen Delivery Method: Circle system utilized Preoxygenation: Pre-oxygenation with 100% oxygen Induction Type: IV induction, Rapid sequence and Cricoid Pressure applied Laryngoscope Size: 3 and McGrath Grade View: Grade I Tube type: Oral Tube size: 7.0 mm Number of attempts: 1 Airway Equipment and Method: Stylet Placement Confirmation: ETT inserted through vocal cords under direct vision, positive ETCO2 and breath sounds checked- equal and bilateral Secured at: 22 cm Tube secured with: Tape Dental Injury: Teeth and Oropharynx as per pre-operative assessment  Comments: Smooth atraumatic intubation, no complications noted.

## 2023-09-16 ENCOUNTER — Inpatient Hospital Stay: Payer: Medicaid Other

## 2023-09-16 ENCOUNTER — Encounter: Payer: Self-pay | Admitting: Gastroenterology

## 2023-09-16 DIAGNOSIS — E872 Acidosis, unspecified: Secondary | ICD-10-CM

## 2023-09-16 DIAGNOSIS — Z9889 Other specified postprocedural states: Secondary | ICD-10-CM

## 2023-09-16 DIAGNOSIS — K220 Achalasia of cardia: Secondary | ICD-10-CM | POA: Diagnosis not present

## 2023-09-16 DIAGNOSIS — R6520 Severe sepsis without septic shock: Secondary | ICD-10-CM | POA: Diagnosis not present

## 2023-09-16 DIAGNOSIS — K2980 Duodenitis without bleeding: Secondary | ICD-10-CM | POA: Insufficient documentation

## 2023-09-16 DIAGNOSIS — K209 Esophagitis, unspecified without bleeding: Secondary | ICD-10-CM | POA: Diagnosis not present

## 2023-09-16 DIAGNOSIS — A419 Sepsis, unspecified organism: Secondary | ICD-10-CM | POA: Diagnosis not present

## 2023-09-16 DIAGNOSIS — N179 Acute kidney failure, unspecified: Secondary | ICD-10-CM | POA: Diagnosis not present

## 2023-09-16 LAB — GLUCOSE, CAPILLARY
Glucose-Capillary: 227 mg/dL — ABNORMAL HIGH (ref 70–99)
Glucose-Capillary: 118 mg/dL — ABNORMAL HIGH (ref 70–99)
Glucose-Capillary: 95 mg/dL (ref 70–99)

## 2023-09-16 LAB — CBC
HCT: 25.2 % — ABNORMAL LOW (ref 36.0–46.0)
Hemoglobin: 8.3 g/dL — ABNORMAL LOW (ref 12.0–15.0)
MCH: 30.7 pg (ref 26.0–34.0)
MCHC: 32.9 g/dL (ref 30.0–36.0)
MCV: 93.3 fL (ref 80.0–100.0)
Platelets: 119 10*3/uL — ABNORMAL LOW (ref 150–400)
RBC: 2.7 MIL/uL — ABNORMAL LOW (ref 3.87–5.11)
RDW: 15.9 % — ABNORMAL HIGH (ref 11.5–15.5)
WBC: 7.8 10*3/uL (ref 4.0–10.5)
nRBC: 0 % (ref 0.0–0.2)

## 2023-09-16 LAB — PROTEIN, PLEURAL OR PERITONEAL FLUID: Total protein, fluid: <3 g/dL

## 2023-09-16 LAB — COMPREHENSIVE METABOLIC PANEL
ALT: 21 U/L (ref 0–44)
AST: 24 U/L (ref 15–41)
Albumin: 1.6 g/dL — ABNORMAL LOW (ref 3.5–5.0)
Alkaline Phosphatase: 149 U/L — ABNORMAL HIGH (ref 38–126)
Anion gap: 10 (ref 5–15)
BUN: 67 mg/dL — ABNORMAL HIGH (ref 8–23)
CO2: 26 mmol/L (ref 22–32)
Calcium: 7.5 mg/dL — ABNORMAL LOW (ref 8.9–10.3)
Chloride: 110 mmol/L (ref 98–111)
Creatinine, Ser: 1.14 mg/dL — ABNORMAL HIGH (ref 0.44–1.00)
GFR, Estimated: 54 mL/min — ABNORMAL LOW (ref >60)
Glucose, Bld: 268 mg/dL — ABNORMAL HIGH (ref 70–99)
Potassium: 4.2 mmol/L (ref 3.5–5.1)
Sodium: 146 mmol/L — ABNORMAL HIGH (ref 135–145)
Total Bilirubin: 0.4 mg/dL (ref 0.0–1.2)
Total Protein: 4.6 g/dL — ABNORMAL LOW (ref 6.5–8.1)

## 2023-09-16 LAB — TYPE AND SCREEN
ABO/RH(D): A POS
Antibody Screen: NEGATIVE
Unit division: 0

## 2023-09-16 LAB — BODY FLUID CELL COUNT WITH DIFFERENTIAL
Eos, Fluid: 0 %
Lymphs, Fluid: 5 %
Monocyte-Macrophage-Serous Fluid: 48 %
Neutrophil Count, Fluid: 47 %
Total Nucleated Cell Count, Fluid: 244 uL

## 2023-09-16 LAB — MAGNESIUM: Magnesium: 1.8 mg/dL (ref 1.7–2.4)

## 2023-09-16 LAB — LACTATE DEHYDROGENASE, PLEURAL OR PERITONEAL FLUID: LD, Fluid: 118 U/L — ABNORMAL HIGH (ref 3–23)

## 2023-09-16 LAB — BPAM RBC
Blood Product Expiration Date: 202502122359
ISSUE DATE / TIME: 202501150717
Unit Type and Rh: 6200

## 2023-09-16 LAB — PHOSPHORUS: Phosphorus: 3.7 mg/dL (ref 2.5–4.6)

## 2023-09-16 MED ORDER — LIDOCAINE HCL (PF) 1 % IJ SOLN
10.0000 mL | Freq: Once | INTRAMUSCULAR | Status: AC
Start: 2023-09-16 — End: 2023-09-16
  Administered 2023-09-16: 10 mL via INTRADERMAL
  Filled 2023-09-16: qty 10

## 2023-09-16 MED ORDER — VITAMIN B-12 1000 MCG PO TABS
1000.0000 ug | ORAL_TABLET | Freq: Every day | ORAL | Status: DC
  Administered 2023-09-17 – 2023-09-30 (×13): 1000 ug
  Filled 2023-09-16 (×14): qty 1

## 2023-09-16 NOTE — Progress Notes (Signed)
SLP Cancellation Note  Patient Details Name: Kaitlyn Hodges MRN: [id number] DOB: Jan 29, 1961   Cancelled treatment:       Reason Eval/Treat Not Completed: Patient at procedure or test/unavailable (chart reviewed)  Pt is currently out of room at a procedure, a thoracentesis. Pt currently has a NGT for TFs.  She has a KNOWN Baseline of Esophageal phase dysmotility w/ impaction events; esophagitis gastritis and duodenitis.  ST services will f/u w/ assessment tomorrow when pt is available.      Jerilynn Som, MS, CCC-SLP Speech Language Pathologist Rehab Services; [facility] [phone] (ascom) Milas Schappell 09/16/2023, 2:14 PM

## 2023-09-16 NOTE — Plan of Care (Signed)
  Problem: Fluid Volume: Goal: Hemodynamic stability will improve Outcome: Progressing   Problem: Clinical Measurements: Goal: Diagnostic test results will improve Outcome: Progressing   Problem: Respiratory: Goal: Ability to maintain adequate ventilation will improve Outcome: Progressing   Problem: Clinical Measurements: Goal: Respiratory complications will improve Outcome: Progressing Goal: Cardiovascular complication will be avoided Outcome: Progressing   Problem: Coping: Goal: Level of anxiety will decrease Outcome: Progressing   Problem: Elimination: Goal: Will not experience complications related to bowel motility Outcome: Progressing Goal: Will not experience complications related to urinary retention Outcome: Progressing   Problem: Pain Management: Goal: General experience of comfort will improve Outcome: Progressing   Problem: Safety: Goal: Ability to remain free from injury will improve Outcome: Progressing   Problem: Coping: Goal: Ability to adjust to condition or change in health will improve Outcome: Progressing   Problem: Fluid Volume: Goal: Ability to maintain a balanced intake and output will improve Outcome: Progressing   Problem: Metabolic: Goal: Ability to maintain appropriate glucose levels will improve Outcome: Progressing   Problem: Tissue Perfusion: Goal: Adequacy of tissue perfusion will improve Outcome: Progressing

## 2023-09-16 NOTE — Plan of Care (Signed)
  Problem: Fluid Volume: Goal: Hemodynamic stability will improve Outcome: Progressing   Problem: Clinical Measurements: Goal: Diagnostic test results will improve Outcome: Progressing Goal: Signs and symptoms of infection will decrease Outcome: Progressing   Problem: Clinical Measurements: Goal: Will remain free from infection Outcome: Progressing Goal: Diagnostic test results will improve Outcome: Progressing Goal: Cardiovascular complication will be avoided Outcome: Progressing   Problem: Nutrition: Goal: Adequate nutrition will be maintained Outcome: Progressing   Problem: Elimination: Goal: Will not experience complications related to bowel motility Outcome: Progressing   Problem: Safety: Goal: Ability to remain free from injury will improve Outcome: Progressing   Problem: Skin Integrity: Goal: Risk for impaired skin integrity will decrease Outcome: Progressing

## 2023-09-16 NOTE — Progress Notes (Addendum)
Progress Note    Kaitlyn Hodges  ZOX:[id number] DOB: 03-May-1961  DOA: 09/04/2023 PCP: Malva Limes, MD      Brief Narrative:    Medical records reviewed and are as summarized below:  Kaitlyn Hodges is a 63 y.o. female with medical history significant of prediabetes, CVA, seizure disorder, type I achalasia,tobacco use disorder, erosive esophagitis, who presented to the hospital with altered mental status.  Her daughter reported cough and shortness of breath for about a month prior to admission.  She also reported nausea, vomiting and poor oral intake for about a week prior to admission Patient was afebrile, hypotensive and tachycardic on vitals. She had a mild acidosis with increased AG. Noted lactate of 7.7. WBC of 32.9. BUN/cr 114/4.61. COVID, flu, RSV negative. Pan scan notable for possible L parotiditis, central airway thickening and patchy airspace opacities in the bases, age indeterminate T3 fracture.  Patient was started on broad spectrum antibiotics and fluid resuscitated.       Assessment/Plan:   Principal Problem:   Severe sepsis (HCC) Active Problems:   Acute renal failure (HCC)   Acute encephalopathy   Achalasia   Pre-diabetes   Seizure disorder (HCC)   History of CVA (cerebrovascular accident)   Closed wedge compression fracture of T3 vertebra (HCC)   Melena   Hemorrhagic shock (HCC)   Gastrointestinal hemorrhage   Lactic acidosis   Pressure injury of skin   Malnutrition of moderate degree   Upper GI bleed   Thrombocytopenia (HCC)   Hypophosphatemia   Acute hypoxic respiratory failure (HCC)   Nutrition Problem: Moderate Malnutrition Etiology: chronic illness  Signs/Symptoms: mild fat depletion, moderate fat depletion, mild muscle depletion, moderate muscle depletion   Body mass index is 29 kg/m.   Severe sepsis. Community-acquired pneumonia. Acute hypoxic respiratory failure. Improved. Respiratory panel-negative. MRSA PCR negative. Off Bipap  and Levophed, PCCM signed off. HFNC 40% tapered to 8L nasal cannula, saturating 98% Oxygen requirement down to 3 L/min. Initially treated with IV Zosyn but this was changed to IV cefepime because of suspicion of drug-induced ITP.  Cefepime finished 09/13/23. Urine cultures insignificant growth.  Blood cultures negative   B/l pleural effusions with compressive atelectasis. CTA chest done on 09/13/2023- negative for PE, new moderate to large bilateral pleural effusions with associated compressive atelectasis. Plan for thoracentesis today. Continue pulmonary toilet, incentive spirometry as needed    Hemorrhagic shock secondary to acute GI bleed, gastritis, duodenitis, oozing duodenal ulcer with visible vessel Acute blood loss anemia. S/p EGD on 09/08/2023, showed tortuous esophagus, diffuse inflammation in the esophagus and the small bowel which is not amenable to any endoscopic procedure.  Repeat EGD on 09/15/2023 showed tortuous esophagus, dilation in the entire esophagus, gastritis, duodenitis, oozing duodenal ulcer with a visible vessel.  Clips were placed.  She treated with argon plasma coagulation. Continue IV Protonix. Patient received blood transfusion on 09/07/23.  S/p transfusion with 1 unit of PRBCs on 09/15/2023 Hemoglobin up from 7.1-8.3.    AKI (acute kidney injury) (HCC) 2/2 hypovolemia and severe sepsis. In the setting of poor p.o. intake, severe uncontrolled achalasia. Renal function improving. Enteral nutrition and free water as able Stopped IV fluids 09/11/23. Avoid nephrotoxic drugs.    Hypokalemia Hypophosphatemia- Improved.  Replete as needed per protocol.    Hypernatremia Slowly improving.    Thrombocytopenia: Improved.  Hematology consult appreciated. 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 Suspicion for TTP, DIC is low. 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 S/p transfusion with  2 units of platelets on 09/14/2023, 2 units of platelets on 07/12/2024 and 1 unit of  platelet on 09/07/2023.      Acute metabolic encephalopathy Slowly improving.  MRI brain done 1/9 showed dural thickening versus thin subdural collection overlying the left cerebral hemisphere, measuring up to 3-4 mm in thickness. Neurosurgery reviewed imaging.  No indication for surgery. EEG showed moderate diffuse encephalopathy. No seizures or epileptiform discharges. Continue supportive care.    Pre-diabetes Sliding scale as needed.   Hypoglycemia protocol.    Closed wedge compression fracture of T3 vertebra (HCC) Incidental finding of a T3 compression fracture on imaging.  S/p fall prior to admission Continue pain management    History of CVA (cerebrovascular accident) CT of the head obtained showed chronic infarcts scattered along the left frontoparietal convexity in the left basal ganglia, as well as lacunar infarcts in the right thalamus. No acute findings.  Will consider aspirin, statin once stable.    Seizure disorder (HCC) History of seizure disorder since CVA in 2010.  No reported seizures since that time. EEG showed moderate diffuse encephalopathy. No seizures or epileptiform discharges. Continue seizure precautions.   Dysphagia, nutrition Restart enteral nutrition after thoracentesis.  Consult speech therapist to reevaluate swallowing.   General Weakness Consult PT and OT    Diet Order             Diet NPO time specified  Diet effective now                            Consultants: Hematologist Intensivist Gastroenterologist  Procedures: EGD on 09/08/2023 EGD on 09/15/2023    Medications:    Chlorhexidine Gluconate Cloth  6 each Topical Daily   [START ON 09/17/2023] vitamin B-12  1,000 mcg Per Tube Daily   free water  125 mL Per Tube Q4H   insulin aspart  0-9 Units Subcutaneous Q6H   nutrition supplement (JUVEN)  1 packet Per Tube BID BM   mouth rinse  15 mL Mouth Rinse 4 times per day   pantoprazole (PROTONIX) IV  40 mg Intravenous  Q12H   sodium chloride flush  3 mL Intravenous Q12H   Continuous Infusions:  feeding supplement (OSMOLITE 1.5 CAL) Stopped (09/16/23 0636)     Anti-infectives (From admission, onward)    Start     Dose/Rate Route Frequency Ordered Stop   09/12/23 1345  ceFEPIme (MAXIPIME) 2 g in sodium chloride 0.9 % 100 mL IVPB       Note to Pharmacy: Pharmacy to dose, heme/ onc advised to dc zosyn in the setting of dropping platelets   2 g 200 mL/hr over 30 Minutes Intravenous Every 12 hours 09/12/23 1255 09/14/23 0118   09/11/23 1315  ceFEPIme (MAXIPIME) 1 g in sodium chloride 0.9 % 100 mL IVPB  Status:  Discontinued       Note to Pharmacy: Pharmacy to dose, heme/ onc advised to dc zosyn in the setting of dropping platelets   1 g 200 mL/hr over 30 Minutes Intravenous Every 12 hours 09/11/23 1222 09/12/23 1255   09/08/23 1800  piperacillin-tazobactam (ZOSYN) IVPB 3.375 g  Status:  Discontinued        3.375 g 12.5 mL/hr over 240 Minutes Intravenous Every 8 hours 09/08/23 1629 09/11/23 1222   09/08/23 1800  vancomycin (VANCOREADY) IVPB 1500 mg/300 mL  Status:  Discontinued        1,500 mg 150 mL/hr over 120 Minutes Intravenous Every 48 hours  09/08/23 1629 09/09/23 0829   09/08/23 1702  vancomycin variable dose per unstable renal function (pharmacist dosing)  Status:  Discontinued         Does not apply See admin instructions 09/08/23 1702 09/09/23 0829   09/08/23 1000  cefTRIAXone (ROCEPHIN) 2 g in sodium chloride 0.9 % 100 mL IVPB  Status:  Discontinued        2 g 200 mL/hr over 30 Minutes Intravenous Every 24 hours 09/08/23 0852 09/08/23 1616   09/07/23 1000  doxycycline (VIBRAMYCIN) 100 mg in dextrose 5 % 250 mL IVPB  Status:  Discontinued        100 mg 125 mL/hr over 120 Minutes Intravenous Every 12 hours 09/07/23 0836 09/08/23 1616   09/07/23 0800  ceFEPIme (MAXIPIME) 2 g in sodium chloride 0.9 % 100 mL IVPB  Status:  Discontinued        2 g 200 mL/hr over 30 Minutes Intravenous Every 12 hours  09/07/23 0534 09/07/23 0538   09/07/23 0800  ceFEPIme (MAXIPIME) 2 g in sodium chloride 0.9 % 100 mL IVPB  Status:  Discontinued        2 g 200 mL/hr over 30 Minutes Intravenous Every 12 hours 09/07/23 0538 09/08/23 0852   09/06/23 1430  doxycycline (VIBRA-TABS) tablet 100 mg  Status:  Discontinued        100 mg Oral Every 12 hours 09/06/23 1418 09/07/23 0836   09/05/23 1200  ceFEPIme (MAXIPIME) 2 g in sodium chloride 0.9 % 100 mL IVPB  Status:  Discontinued        2 g 200 mL/hr over 30 Minutes Intravenous Every 24 hours 09/04/23 1450 09/05/23 0956   09/05/23 1000  cefTRIAXone (ROCEPHIN) 2 g in sodium chloride 0.9 % 100 mL IVPB  Status:  Discontinued        2 g 200 mL/hr over 30 Minutes Intravenous Every 24 hours 09/05/23 0956 09/07/23 0527   09/04/23 2200  metroNIDAZOLE (FLAGYL) IVPB 500 mg  Status:  Discontinued        500 mg 100 mL/hr over 60 Minutes Intravenous Every 12 hours 09/04/23 1416 09/08/23 1616   09/04/23 1600  vancomycin (VANCOREADY) IVPB 750 mg/150 mL        750 mg 150 mL/hr over 60 Minutes Intravenous  Once 09/04/23 1450 09/04/23 1611   09/04/23 1450  vancomycin variable dose per unstable renal function (pharmacist dosing)  Status:  Discontinued         Does not apply See admin instructions 09/04/23 1450 09/06/23 1418   09/04/23 1130  ceFEPIme (MAXIPIME) 2 g in sodium chloride 0.9 % 100 mL IVPB        2 g 200 mL/hr over 30 Minutes Intravenous  Once 09/04/23 1123 09/04/23 1233   09/04/23 1130  metroNIDAZOLE (FLAGYL) IVPB 500 mg        500 mg 100 mL/hr over 60 Minutes Intravenous  Once 09/04/23 1123 09/04/23 1318   09/04/23 1130  vancomycin (VANCOCIN) IVPB 1000 mg/200 mL premix        1,000 mg 200 mL/hr over 60 Minutes Intravenous  Once 09/04/23 1123 09/04/23 1349              Family Communication/Anticipated D/C date and plan/Code Status   DVT prophylaxis:      Code Status: Full Code  Family Communication: None Disposition Plan: Likely discharge to  SNF   Status is: Inpatient Remains inpatient appropriate because: Respiratory failure, sepsis      Subjective:   Interval  events noted.  She is talking less and just uses head movement for simple yes or no answers.  She does not report any complaints.  She indicated that she feels better with her head.  Objective:    Vitals:   09/16/23 0900 09/16/23 1000 09/16/23 1135 09/16/23 1200  BP: (!) 92/55 90/60  118/71  Pulse: 65 70    Resp: 12 13  12   Temp:   98.3 F (36.8 C)   TempSrc:   Axillary   SpO2: 99% 97%    Weight:      Height:       No data found.   Intake/Output Summary (Last 24 hours) at 09/16/2023 1236 Last data filed at 09/16/2023 0859 Gross per 24 hour  Intake 884.43 ml  Output 1600 ml  Net -715.57 ml   Filed Weights   09/14/23 0500 09/15/23 0500 09/16/23 0500  Weight: 84.4 kg 84.2 kg 81.5 kg    Exam:  GEN: NAD SKIN: Bruises on bilateral upper extremities EYES: No pallor or icterus ENT: MMM CV: RRR PULM: CTA B ABD: soft, ND, NT, +BS CNS: Alert, nonfocal EXT: Bilateral leg edema.  No tenderness or erythema Rectal tube with black liquid stools     Data Reviewed:   I have personally reviewed following labs and imaging studies:  Labs: Labs show the following:   Basic Metabolic Panel: Recent Labs  Lab 09/10/23 0608 09/11/23 0213 09/12/23 0429 09/13/23 0322 09/14/23 0417 09/15/23 0418 09/16/23 0437  NA 151* 149* 143 143 144 148* 146*  K 3.1* 3.2* 3.8 3.6 3.7 3.5 4.2  CL 116* 113* 102 105 109 112* 110  CO2 24 29 29 28 28 29 26   GLUCOSE 179* 200* 237* 162* 182* 81 268*  BUN 76* 81* 75* 66* 72* 71* 67*  CREATININE 1.53* 1.37* 1.30* 1.19* 1.22* 1.07* 1.14*  CALCIUM 7.3* 7.2* 7.1* 7.3* 7.4* 7.6* 7.5*  MG 1.8 1.7 2.1  --  1.8  --  1.8  PHOS 1.1* 1.6* 2.7  --  2.4* 3.9 3.7   GFR Estimated Creatinine Clearance: 54.4 mL/min (A) (by C-G formula based on SCr of 1.14 mg/dL (H)). Liver Function Tests: Recent Labs  Lab 09/11/23 0213  09/13/23 0322 09/16/23 0437  AST 17 26 24   ALT 16 18 21   ALKPHOS 158* 151* 149*  BILITOT 1.1 0.6 0.4  PROT 4.1* 4.5* 4.6*  ALBUMIN 1.8* 1.6* 1.6*   No results for input(s): "LIPASE", "AMYLASE" in the last 168 hours. No results for input(s): "AMMONIA" in the last 168 hours. Coagulation profile Recent Labs  Lab 09/11/23 1337  INR 1.4*    CBC: Recent Labs  Lab 09/09/23 1545 09/11/23 0213 09/12/23 1503 09/13/23 0322 09/13/23 0644 09/14/23 0417 09/14/23 1558 09/15/23 0418 09/16/23 0437  WBC 23.4*   < > 6.1 5.9 7.0 6.6  --  6.9 7.8  NEUTROABS 22.1*  --  5.2  --   --   --   --   --   --   HGB 14.1   < > 10.6* 8.8* 9.6* 7.6* 8.0* 7.1* 8.3*  HCT 40.9   < > 32.1* 25.7* 28.0* 22.4* 24.4* 22.0* 25.2*  MCV 90.7   < > 92.2 90.8 91.2 90.7  --  96.1 93.3  PLT 53*   < > 44* 30* 30* 33*  --  116* 119*   < > = values in this interval not displayed.   Cardiac Enzymes: No results for input(s): "CKTOTAL", "CKMB", "CKMBINDEX", "TROPONINI" in the last  168 hours. BNP (last 3 results) No results for input(s): "PROBNP" in the last 8760 hours. CBG: Recent Labs  Lab 09/15/23 1141 09/15/23 1827 09/15/23 2313 09/16/23 0540 09/16/23 1146  GLUCAP 87 105* 177* 227* 118*   D-Dimer: No results for input(s): "DDIMER" in the last 72 hours.  Hgb A1c: No results for input(s): "HGBA1C" in the last 72 hours. Lipid Profile: No results for input(s): "CHOL", "HDL", "LDLCALC", "TRIG", "CHOLHDL", "LDLDIRECT" in the last 72 hours. Thyroid function studies: No results for input(s): "TSH", "T4TOTAL", "T3FREE", "THYROIDAB" in the last 72 hours.  Invalid input(s): "FREET3" Anemia work up: No results for input(s): "VITAMINB12", "FOLATE", "FERRITIN", "TIBC", "IRON", "RETICCTPCT" in the last 72 hours.  Sepsis Labs: Recent Labs  Lab 09/09/23 1545 09/09/23 1830 09/11/23 0213 09/13/23 0644 09/14/23 0417 09/15/23 0418 09/16/23 0437  WBC 23.4*  --    < > 7.0 6.6 6.9 7.8  LATICACIDVEN 7.2* 2.3*  --    --   --   --   --    < > = values in this interval not displayed.    Microbiology Recent Results (from the past 240 hours)  MRSA Next Gen by PCR, Nasal     Status: None   Collection Time: 09/08/23  4:20 PM   Specimen: Nasal Mucosa; Nasal Swab  Result Value Ref Range Status   MRSA by PCR Next Gen NOT DETECTED NOT DETECTED Final    Comment: (NOTE) The GeneXpert MRSA Assay (FDA approved for NASAL specimens only), is one component of a comprehensive MRSA colonization surveillance program. It is not intended to diagnose MRSA infection nor to guide or monitor treatment for MRSA infections. Test performance is not FDA approved in patients less than 54 years old. Performed at [facility], [address] [place], [place] [address]     Procedures and diagnostic studies:  No results found.              LOS: 12 days   Lanita Stammen  [employer]ered loss adjuster on [email or web address]. If 7PM-7AM, please contact night-coverage at [email or web address]     09/16/2023, 12:36 PM

## 2023-09-16 NOTE — Progress Notes (Signed)
Midge Minium, MD [facility]   [address], [address] [place], [place] [address] Phone: [phone] Fax : [phone]   Subjective: The patient underwent an EGD yesterday with a small bleeding vessel in the duodenum and severe duodenitis with esophagitis seen.  The patient denies any abdominal pain at present time.   Objective: Vital signs in last 24 hours: Vitals:   09/16/23 0900 09/16/23 1000 09/16/23 1135 09/16/23 1200  BP: (!) 92/55 90/60  118/71  Pulse: 65 70    Resp: 12 13  12   Temp:   98.3 F (36.8 C)   TempSrc:   Axillary   SpO2: 99% 97%    Weight:      Height:       Weight change: -2.7 kg  Intake/Output Summary (Last 24 hours) at 09/16/2023 1244 Last data filed at 09/16/2023 0859 Gross per 24 hour  Intake 884.43 ml  Output 1600 ml  Net -715.57 ml     Exam: Heart:: Regular rate and rhythm, S1S2 present, or without murmur or extra heart sounds Lungs: normal and clear to auscultation and percussion Abdomen: soft, nontender, normal bowel sounds   Lab Results: @LABTEST2 @ Micro Results: Recent Results (from the past 240 hours)  MRSA Next Gen by PCR, Nasal     Status: None   Collection Time: 09/08/23  4:20 PM   Specimen: Nasal Mucosa; Nasal Swab  Result Value Ref Range Status   MRSA by PCR Next Gen NOT DETECTED NOT DETECTED Final    Comment: (NOTE) The GeneXpert MRSA Assay (FDA approved for NASAL specimens only), is one component of a comprehensive MRSA colonization surveillance program. It is not intended to diagnose MRSA infection nor to guide or monitor treatment for MRSA infections. Test performance is not FDA approved in patients less than 62 years old. Performed at [facility], [address] [place], [place] [address]    Studies/Results: No results found. Medications: I have reviewed the patient's current medications. Scheduled Meds:  Chlorhexidine Gluconate Cloth  6 each Topical Daily   [START ON 09/17/2023] vitamin B-12  1,000 mcg  Per Tube Daily   free water  125 mL Per Tube Q4H   insulin aspart  0-9 Units Subcutaneous Q6H   nutrition supplement (JUVEN)  1 packet Per Tube BID BM   mouth rinse  15 mL Mouth Rinse 4 times per day   pantoprazole (PROTONIX) IV  40 mg Intravenous Q12H   sodium chloride flush  3 mL Intravenous Q12H   Continuous Infusions:  feeding supplement (OSMOLITE 1.5 CAL) Stopped (09/16/23 0636)   PRN Meds:.acetaminophen **OR** acetaminophen, liver oil-zinc oxide, ondansetron **OR** ondansetron (ZOFRAN) IV, mouth rinse   Assessment: Principal Problem:   Severe sepsis (HCC) Active Problems:   Seizure disorder (HCC)   Achalasia   Pre-diabetes   Acute renal failure (HCC)   Acute encephalopathy   History of CVA (cerebrovascular accident)   Closed wedge compression fracture of T3 vertebra (HCC)   Melena   Hemorrhagic shock (HCC)   Gastrointestinal hemorrhage   Lactic acidosis   Pressure injury of skin   Malnutrition of moderate degree   Upper GI bleed   Thrombocytopenia (HCC)   Hypophosphatemia   Acute hypoxic respiratory failure (HCC)    Plan: The patient is doing much better and her hemoglobin is better today than it was yesterday.  The patient has no complaints and has had no further sign of bleeding.  The patient had flecks placed on a bleeding vessel with some cautery  yesterday.  Nothing further to do from GI point of view at this time.  Please do not history to call if she should have any further sign of bleeding.  There may be a slight ooze from the esophagitis due to the achalasia and duodenitis before completely heals.  I would continue to treat the patient with the PPI.  I will sign off.  Please call if any further GI concerns or questions.  We would like to thank you for the opportunity to participate in the care of Kaitlyn Hodges.    LOS: 12 days   Midge Minium, MD.FACG 09/16/2023, 12:44 PM Pager [phone] 7am-5pm  Check AMION for 5pm -7am coverage and on weekends

## 2023-09-16 NOTE — Progress Notes (Signed)
Palliative Care Progress Note, Assessment & Plan   Patient Name: Kaitlyn Hodges       Date: 09/16/2023 DOB: 07-08-1961  Age: 63 y.o. MRN#: [id number] Attending Physician: Lurene Shadow, MD Primary Care Physician: Malva Limes, MD Admit Date: 09/04/2023  Subjective: Patient is sitting up in bed.  She is awake, she acknowledges my presence.  I question whether or not she is able to make her wishes known.  No family or friends present during my visit.  HPI: 63 y.o. female  with past medical history of CVA (2010), seizure disorder, prediabetes, type I achalasia, tobacco use, and erosive esophagitis admitted on 09/04/2023 with AMS.   As per chart review, daughter endorsed cough and shortness of breath for about a month PTA.  Daughter also endorsed nausea with vomiting and poor oral intake for approximately 1 week PTA.   Course of hospitalization has revealed a pan scan notable for possible left parotiditis, central airway thickening, and patchy airspace opacities in the bases with an age indeterminate T3 fracture.   Patient's respiratory panel was negative.  She is being treated for community-acquired ammonia with acute hypoxic respiratory failure, bilateral pleural effusions with compressive atelectasis, hemorrhagic shock secondary to acute GI bleed with acute blood loss anemia, AKI, thrombocytopenia disease hematology consulted), and acute metabolic encephalopathy.   MRI of brain revealed dural thickening versus thin subdural collection overlying the left cerebral hemisphere, measuring up to 3-4 mm in thickness.  Neurosurgery reviewed imaging and recommended no indication for surgery at this time.   EEG revealed moderate diffuse encephalopathy with no seizures or epileptiform discharges noted.   CT of head  revealed chronic infarcts but no acute findings.   PMT was consulted to discuss boundaries and goals of care.  Summary of counseling/coordination of care: Extensive chart review completed prior to meeting patient including labs, vital signs, imaging, progress notes, orders, and available advanced directive documents from current and previous encounters.   After reviewing the patient's chart and assessing the patient at bedside, I spoke with patient in regards to symptom management and goals of care.   Symptoms assessed.  Patient denies pain or discomfort by shaking her head no.  No adjustment to [facility] needed at this time.  I attempted to gauge patient's understanding of her current medical situation, when asked why she is here, she stares at me for a long time and has no response.  When asked if she knows where she is she nods yes.  When asked for her to share with me where she is, she again stares at me and makes no response.  I asked if it was difficult for her to get her words out and she loudly said no.  I shared that I had spoken with her daughter and was keeping her updated on patient's current medical situation.  The patient made hand gestures shares with a crossover her heart and then putting her hand up in the air like a high-five.  I cannot make sense of these gestures and patient could not make vocalizations to clarify what she was trying to convey to me.  Brief medical update given, including description of NG tube, tube feedings, blood draws, vital sign monitoring,  and use of supplemental oxygen.  Discussed that patient's mental status is not back to baseline and that workup continues to address her medical issues.  Again, she had no verbal response or input.  Patient is unable to participate in medical decision making or goals of care discussions independently at this time.  As per discussions with daughter, full code and full scope remain.  PMT remains unable to patient and family  throughout her hospitalization.  PMT will continue to follow, monitor, and support.  Physical Exam Vitals reviewed.  Constitutional:      General: She is not in acute distress.    Appearance: She is normal weight.  HENT:     Head: Normocephalic.     Mouth/Throat:     Mouth: Mucous membranes are moist.  Eyes:     Pupils: Pupils are equal, round, and reactive to light.  Cardiovascular:     Rate and Rhythm: Normal rate.  Pulmonary:     Effort: Pulmonary effort is normal.  Abdominal:     Palpations: Abdomen is soft.  Skin:    General: Skin is warm and dry.     Coloration: Skin is pale.  Neurological:     Mental Status: She is alert.  Psychiatric:        Mood and Affect: Mood normal.        Behavior: Behavior normal.             Total Time 25 minutes   Time spent includes: Detailed review of medical records (labs, imaging, vital signs), medically appropriate exam (mental status, respiratory, cardiac, skin), discussed with treatment team, counseling and educating patient, family and staff, documenting clinical information, medication management and coordination of care.  Samara Deist L. Bonita Quin, DNP, FNP-BC Palliative Medicine Team

## 2023-09-16 NOTE — Progress Notes (Signed)
Nutrition Follow Up Note   DOCUMENTATION CODES:   Non-severe (moderate) malnutrition in context of chronic illness  INTERVENTION:   Osmolite 1.5@55ml /hr continuous   Free water flushes q4 hours   Regimen provides 1980kcal/day, 83g/day protein and 1760ml/day of free water.   Cyanocobalamin 1000 mcg daily via tube   Juven Fruit Punch BID via tube, each serving provides 95kcal and 2.5g of protein (amino acids glutamine and arginine)  Daily weights   NUTRITION DIAGNOSIS:   Moderate Malnutrition related to chronic illness as evidenced by mild fat depletion, moderate fat depletion, mild muscle depletion, moderate muscle depletion. -ongoing   GOAL:   Patient will meet greater than or equal to 90% of their needs -met with tube feeds   MONITOR:   Diet advancement, Labs, Weight trends, TF tolerance, I & O's, Skin  ASSESSMENT:   63 y/o female with h/o seizures, dysphagia secondary to achalasia with esophageal dilation and numerous food impactions, CVA (2009), depression, DM and compression fracture who is admitted with AMS, AKI, GIB and sepsis.  -Pt s/p EGD 1/8; pt noted to have tortuous esophagus, esophagitis with dilation & erythematous duodenopathy   -Pt s/p EGD 1/15; pt noted to have tortuous esophagus, esophagitis with dilation, duodenitis & oozing duodenal ulcer   Visited pt's room today. Pt is more alert. NGT remains in place. Tube feeds held since yesterday after EGD. Plan is to resume tube feeds after thoracentesis today. Palliative care is following; family is agreeable to G-tube if needed. Refeed labs stable; discontinue thiamine. Hypernatremia improved. Per chart, pt is up ~19lbs since admission; pt +5.3L on her I & Os. Volume status improving; edema improved on exam. Pt with B12 deficiency; pt is receiving supplementation.      Medications reviewed and include: insulin, juven, protonix  Labs reviewed: Na 146(H), K 4.2 wnl, BUN 67(H), creat 1.14(H), P 3.7 wnl, Mg  1.8 wnl B12- 172(L)- 1/11 Hgb 8.3(L), Hct 25.2(L) Cbgs- 118, 227 x 24 hrs   UOP-   Diet Order:   Diet Order             Diet NPO time specified  Diet effective now                  EDUCATION NEEDS:   No education needs have been identified at this time  Skin:  Skin Assessment: Reviewed RN Assessment (Stage II buttocks)  Last BM:  1/16- type 7- via rectal  Height:   Ht Readings from Last 1 Encounters:  09/04/23 5\' 6"  (1.676 m)    Weight:   Wt Readings from Last 1 Encounters:  09/16/23 81.5 kg    Ideal Body Weight:  59 kg  BMI:  Body mass index is 29 kg/m.  Estimated Nutritional Needs:   Kcal:  1700-1900kcal/day  Protein:  85-95g/day  Fluid:  1.8-2.1L/day  Betsey Holiday MS, RD, LDN If unable to be reached, please send secure chat to "RD inpatient" available from 8:00a-4:00p daily

## 2023-09-16 NOTE — Anesthesia Postprocedure Evaluation (Signed)
Anesthesia Post Note  Patient: Caili Muldrew  Procedure(s) Performed: ESOPHAGOGASTRODUODENOSCOPY (EGD) WITH PROPOFOL HEMOSTASIS CLIP PLACEMENT HOT HEMOSTASIS (ARGON PLASMA COAGULATION/BICAP)  Patient location during evaluation: ICU Anesthesia Type: General Level of consciousness: awake Pain management: pain level controlled Vital Signs Assessment: post-procedure vital signs reviewed and stable Respiratory status: spontaneous breathing Cardiovascular status: stable Anesthetic complications: no   No notable events documented.   Last Vitals:  Vitals:   09/16/23 0545 09/16/23 0600  BP: 95/70 (!) 91/59  Pulse: 72 77  Resp: 13 13  Temp:    SpO2: 100% 99%    Last Pain:  Vitals:   09/16/23 0400  TempSrc: Oral  PainSc: 0-No pain                 Jaye Beagle

## 2023-09-16 NOTE — Procedures (Signed)
PROCEDURE SUMMARY:  Successful US guided right thoracentesis. Yielded 600 mL of yellow fluid. Pt tolerated procedure well. No immediate complications.  Specimen was sent for labs. CXR ordered.  EBL < 5 mL  Hoyt Koch PA-C 09/16/2023 2:57 PM

## 2023-09-17 DIAGNOSIS — R6520 Severe sepsis without septic shock: Secondary | ICD-10-CM | POA: Diagnosis not present

## 2023-09-17 DIAGNOSIS — A419 Sepsis, unspecified organism: Secondary | ICD-10-CM | POA: Diagnosis not present

## 2023-09-17 LAB — BASIC METABOLIC PANEL
Anion gap: 7 (ref 5–15)
BUN: 56 mg/dL — ABNORMAL HIGH (ref 8–23)
CO2: 29 mmol/L (ref 22–32)
Calcium: 7.7 mg/dL — ABNORMAL LOW (ref 8.9–10.3)
Chloride: 115 mmol/L — ABNORMAL HIGH (ref 98–111)
Creatinine, Ser: 1.07 mg/dL — ABNORMAL HIGH (ref 0.44–1.00)
GFR, Estimated: 58 mL/min — ABNORMAL LOW (ref >60)
Glucose, Bld: 207 mg/dL — ABNORMAL HIGH (ref 70–99)
Potassium: 4 mmol/L (ref 3.5–5.1)
Sodium: 151 mmol/L — ABNORMAL HIGH (ref 135–145)

## 2023-09-17 LAB — CBC
HCT: 25.1 % — ABNORMAL LOW (ref 36.0–46.0)
Hemoglobin: 8.1 g/dL — ABNORMAL LOW (ref 12.0–15.0)
MCH: 31.4 pg (ref 26.0–34.0)
MCHC: 32.3 g/dL (ref 30.0–36.0)
MCV: 97.3 fL (ref 80.0–100.0)
Platelets: 131 10*3/uL — ABNORMAL LOW (ref 150–400)
RBC: 2.58 MIL/uL — ABNORMAL LOW (ref 3.87–5.11)
RDW: 15.6 % — ABNORMAL HIGH (ref 11.5–15.5)
WBC: 6.5 10*3/uL (ref 4.0–10.5)
nRBC: 0 % (ref 0.0–0.2)

## 2023-09-17 LAB — GLUCOSE, CAPILLARY
Glucose-Capillary: 195 mg/dL — ABNORMAL HIGH (ref 70–99)
Glucose-Capillary: 197 mg/dL — ABNORMAL HIGH (ref 70–99)
Glucose-Capillary: 202 mg/dL — ABNORMAL HIGH (ref 70–99)
Glucose-Capillary: 203 mg/dL — ABNORMAL HIGH (ref 70–99)
Glucose-Capillary: 205 mg/dL — ABNORMAL HIGH (ref 70–99)

## 2023-09-17 LAB — PATHOLOGIST SMEAR REVIEW

## 2023-09-17 MED ORDER — FREE WATER
125.0000 mL | Status: DC
  Administered 2023-09-17 – 2023-09-18 (×11): 125 mL

## 2023-09-17 MED ORDER — CHLORHEXIDINE GLUCONATE CLOTH 2 % EX PADS
6.0000 | MEDICATED_PAD | Freq: Every day | CUTANEOUS | Status: DC
  Administered 2023-09-17 – 2023-09-25 (×9): 6 via TOPICAL

## 2023-09-17 NOTE — Evaluation (Signed)
Physical Therapy Evaluation Patient Details Name: Kaitlyn Hodges MRN: [id number] DOB: 11-Dec-1960 Today's Date: 09/17/2023  History of Present Illness  Kaitlyn Hodges is a 63 y.o. female with medical history significant of prediabetes, CVA, seizure disorder, type I achalasia,tobacco use disorder, erosive esophagitis who presents to the ED due to altered mental status. History obtained from patient's daughter at bedside due to patient's altered mental status.  Patient's daughter states that over the last 1 month, patient has been experiencing significant shortness of breath and cough that has progressively worsened.  Then for the last 1 week, she has been experiencing persistent nausea, vomiting and poor p.o. intake.  She notes that her mom has been handling her achalasia for years, and she has not noticed any choking episodes.   Clinical Impression  Patient received in bed, alert, minimally responsive. Does not seem oriented. Limited ability to follow direction. Patient requires total +2 assist for all mobility at this time, she is profoundly weak. She will continue to benefit from skilled PT to improve strength, endurance and functional independence.          If plan is discharge home, recommend the following: Two people to help with walking and/or transfers;Two people to help with bathing/dressing/bathroom   Can travel by private vehicle   No    Equipment Recommendations Other (comment) (TBD as she progresses)  Recommendations for Other Services       Functional Status Assessment Patient has had a recent decline in their functional status and/or demonstrates limited ability to make significant improvements in function in a reasonable and predictable amount of time     Precautions / Restrictions Precautions Precautions: Fall Restrictions Weight Bearing Restrictions Per Provider Order: No      Mobility  Bed Mobility Overal bed mobility: Needs Assistance Bed Mobility: Rolling, Supine to  Sit, Sit to Supine Rolling: Total assist, +2 for physical assistance   Supine to sit: Total assist, +2 for physical assistance Sit to supine: Total assist, +2 for physical assistance        Transfers                   General transfer comment: unable to attempt-patient unable to sit EOB unsupported    Ambulation/Gait                  Stairs            Wheelchair Mobility     Tilt Bed    Modified Rankin (Stroke Patients Only)       Balance Overall balance assessment: Needs assistance Sitting-balance support: Feet supported Sitting balance-Leahy Scale: Poor                                       Pertinent Vitals/Pain Pain Assessment Pain Assessment: PAINAD Breathing: normal Negative Vocalization: occasional moan/groan, low speech, negative/disapproving quality Facial Expression: smiling or inexpressive Body Language: relaxed Consolability: no need to console PAINAD Score: 1    Home Living Family/patient expects to be discharged to:: Private residence Living Arrangements: Children (lives with daughter, spouse passed away last year per daughter.) Available Help at Discharge: Family;Available PRN/intermittently Type of Home: Mobile home Home Access: Stairs to enter Entrance Stairs-Rails: Right Entrance Stairs-Number of Steps: 6   Home Layout: One level Home Equipment: Hospital bed Additional Comments: daughter reports they may have walker and such from her husband    Prior Function Prior Level  of Function : Independent/Modified Independent;Driving             Mobility Comments: per daughter patient was very independent, driving, helping take care of 55 year old grandchild while dtr worked.  Did not use AD. Daughter states patient had no residual effects from CVA ADLs Comments: independent     Extremity/Trunk Assessment   Upper Extremity Assessment Upper Extremity Assessment: Defer to OT evaluation    Lower  Extremity Assessment Lower Extremity Assessment: Generalized weakness;Difficult to assess due to impaired cognition;RLE deficits/detail;LLE deficits/detail RLE Deficits / Details: no active movement of LEs RLE Coordination: decreased gross motor LLE Deficits / Details: No active movement of LEs LLE Coordination: decreased gross motor    Cervical / Trunk Assessment Cervical / Trunk Assessment: Other exceptions Cervical / Trunk Exceptions: forward flexed head in sitting edge of bed due to profound weakness  Communication   Communication Communication: Difficulty communicating thoughts/reduced clarity of speech Cueing Techniques: Verbal cues;Gestural cues  Cognition Arousal: Lethargic Behavior During Therapy: Flat affect Overall Cognitive Status: Impaired/Different from baseline Area of Impairment: Orientation, Following commands, Safety/judgement, Awareness, Attention, Problem solving, Memory                 Orientation Level: Disoriented to, Place, Time, Situation Current Attention Level: Sustained   Following Commands: Follows one step commands inconsistently Safety/Judgement: Decreased awareness of safety, Decreased awareness of deficits Awareness: Intellectual Problem Solving: Slow processing, Decreased initiation, Difficulty sequencing, Requires verbal cues, Requires tactile cues          General Comments      Exercises     Assessment/Plan    PT Assessment Patient needs continued PT services  PT Problem List Decreased strength;Decreased range of motion;Decreased activity tolerance;Decreased balance;Decreased mobility;Decreased cognition       PT Treatment Interventions Functional mobility training;Therapeutic activities;Therapeutic exercise;Gait training;Balance training;Patient/family education;Cognitive remediation;Neuromuscular re-education    PT Goals (Current goals can be found in the Care Plan section)  Acute Rehab PT Goals Patient Stated Goal: to go to  rehab PT Goal Formulation: With family Time For Goal Achievement: 10/01/23 Potential to Achieve Goals: Good    Frequency Min 1X/week     Co-evaluation PT/OT/SLP Co-Evaluation/Treatment: Yes Reason for Co-Treatment: For patient/therapist safety;Necessary to address cognition/behavior during functional activity;To address functional/ADL transfers PT goals addressed during session: Mobility/safety with mobility;Balance         AM-PAC PT "6 Clicks" Mobility  Outcome Measure Help needed turning from your back to your side while in a flat bed without using bedrails?: Total Help needed moving from lying on your back to sitting on the side of a flat bed without using bedrails?: Total Help needed moving to and from a bed to a chair (including a wheelchair)?: Total Help needed standing up from a chair using your arms (e.g., wheelchair or bedside chair)?: Total Help needed to walk in hospital room?: Total Help needed climbing 3-5 steps with a railing? : Total 6 Click Score: 6    End of Session Equipment Utilized During Treatment: Oxygen Activity Tolerance: Patient limited by fatigue;Patient limited by lethargy Patient left: in bed;with call bell/phone within reach Nurse Communication: Mobility status PT Visit Diagnosis: Muscle weakness (generalized) (M62.81);Other abnormalities of gait and mobility (R26.89)    Time: 0950-1002 PT Time Calculation (min) (ACUTE ONLY): 12 min   Charges:   PT Evaluation $PT Eval Moderate Complexity: 1 Mod   PT General Charges $$ ACUTE PT VISIT: 1 Visit         Lissa Merlin, PT,  GCS 09/17/23,11:19 AM

## 2023-09-17 NOTE — Evaluation (Signed)
Clinical/Bedside Swallow Evaluation Patient Details  Name: Kaitlyn Hodges MRN: [id number] Date of Birth: Aug 20, 1961  Today's Date: 09/17/2023 Time: SLP Start Time (ACUTE ONLY): 1130 SLP Stop Time (ACUTE ONLY): 1141 SLP Time Calculation (min) (ACUTE ONLY): 11 min  Past Medical History:  Past Medical History:  Diagnosis Date   Kidney stones    Stroke [facility])    Past Surgical History:  Past Surgical History:  Procedure Laterality Date   ABDOMINAL HYSTERECTOMY  2003   Total; Polycystic ovarian Disease   BIOPSY  01/17/2020   Procedure: BIOPSY;  Surgeon: Napoleon Form, MD;  Location: [facility] ENDOSCOPY;  Service: Endoscopy;;   ESOPHAGOGASTRODUODENOSCOPY N/A 09/08/2023   Procedure: ESOPHAGOGASTRODUODENOSCOPY (EGD);  Surgeon: Midge Minium, MD;  Location: [facility] ENDOSCOPY;  Service: Endoscopy;  Laterality: N/A;   ESOPHAGOGASTRODUODENOSCOPY (EGD) WITH PROPOFOL N/A 12/14/2019   Procedure: ESOPHAGOGASTRODUODENOSCOPY (EGD) WITH PROPOFOL;  Surgeon: Wyline Mood, MD;  Location: [facility] ENDOSCOPY;  Service: Gastroenterology;  Laterality: N/A;   ESOPHAGOGASTRODUODENOSCOPY (EGD) WITH PROPOFOL N/A 01/17/2020   Procedure: ESOPHAGOGASTRODUODENOSCOPY (EGD) WITH PROPOFOL;  Surgeon: Napoleon Form, MD;  Location: [facility] ENDOSCOPY;  Service: Endoscopy;  Laterality: N/A;  Gastric Lavage   ESOPHAGOGASTRODUODENOSCOPY (EGD) WITH PROPOFOL N/A 09/15/2023   Procedure: ESOPHAGOGASTRODUODENOSCOPY (EGD) WITH PROPOFOL;  Surgeon: Midge Minium, MD;  Location: [facility] ENDOSCOPY;  Service: Endoscopy;  Laterality: N/A;   FOREIGN BODY REMOVAL  01/17/2020   Procedure: FOREIGN BODY REMOVAL;  Surgeon: Napoleon Form, MD;  Location: [facility] ENDOSCOPY;  Service: Endoscopy;;  Food impaction in Esophagus   HEMOSTASIS CLIP PLACEMENT  09/15/2023   Procedure: HEMOSTASIS CLIP PLACEMENT;  Surgeon: Midge Minium, MD;  Location: [facility] ENDOSCOPY;  Service: Endoscopy;;   HOT HEMOSTASIS  09/15/2023   Procedure: HOT HEMOSTASIS (ARGON PLASMA COAGULATION/BICAP);   Surgeon: Midge Minium, MD;  Location: [facility] ENDOSCOPY;  Service: Endoscopy;;   HPI:  Kaitlyn Hodges is a 63 y.o. female with medical history significant of prediabetes, CVA, seizure disorder, type I achalasia, tobacco use disorder, erosive esophagitis who presents to the ED due to altered mental status. Patient's daughter states that over the last 1 month, patient has been experiencing significant shortness of breath and cough that has progressively worsened.  Then for the last 1 week, she has been experiencing persistent nausea, vomiting and poor p.o. intake. Pt admited on 09/04/2023, transferred to ICU d/t possible GI bleed with multiple EGDs, currently has NGT for external feeding.    Assessment / Plan / Recommendation  Clinical Impression  Pt presents as awake, responds to her name but remains minimally responsive. She continues to be significantly delayed in her responses. Given that she was awake and able to imitate some phonation (able to imitate "ah"), SLP provided skilled observation of pt consuming 1 ice chip. Pt presented with delayed oral response/movement and a delayed appearance of potential pharyngeal swallow. When presented with an additional ice chip, pt closed her mouth and with delay of ~ 30 seconds gestured "no" to the question "do you want another ice chip?" given the above, her acute illness and extensive history of esophageal dysfunction, it is not likely that pt will be able to sustain her hydration and nutrition orally. Will recommend ice chips for stimulation of the swallow musculature but no further diet advancement while inpatient. Education provided to pt's treatment team on consideration of long term means of nutrition vs GOC/Palliative/Hospice. ST services to sign off at this time. SLP Visit Diagnosis: Dysphagia, unspecified (R13.10)    Aspiration Risk  Severe aspiration risk;Risk for inadequate nutrition/hydration  Diet Recommendation Ice chips PRN after oral care     Medication Administration: Via alternative means Compensations: Minimize environmental distractions;Slow rate;Small sips/bites Postural Changes: Seated upright at 90 degrees;Remain upright for at least 30 minutes after po intake    Other  Recommendations Oral Care Recommendations: Oral care prior to ice chip/H20;Oral care QID    Recommendations for follow up therapy are one component of a multi-disciplinary discharge planning process, led by the attending physician.  Recommendations may be updated based on patient status, additional functional criteria and insurance authorization.  Follow up Recommendations No SLP follow up      Assistance Recommended at Discharge  N/A  Functional Status Assessment  N/A  Frequency and Duration   N/A         Prognosis Prognosis for improved oropharyngeal function: Guarded Barriers to Reach Goals: Cognitive deficits;Time post onset;Severity of deficits (significant esophageal dysfunction)      Swallow Study   General Date of Onset: 09/17/23 (pt admitted on 09/04/2023 - long standing esohpageal dysfunction) HPI: Kaitlyn Hodges is a 63 y.o. female with medical history significant of prediabetes, CVA, seizure disorder, type I achalasia, tobacco use disorder, erosive esophagitis who presents to the ED due to altered mental status. Patient's daughter states that over the last 1 month, patient has been experiencing significant shortness of breath and cough that has progressively worsened.  Then for the last 1 week, she has been experiencing persistent nausea, vomiting and poor p.o. intake. Pt admited on 09/04/2023, transferred to ICU d/t possible GI bleed with multiple EGDs, currently has NGT for external feeding. Type of Study: Bedside Swallow Evaluation Previous Swallow Assessment: previous interactions, pt unable to participate Diet Prior to this Study: NPO Temperature Spikes Noted: No Respiratory Status: Nasal cannula History of Recent Intubation:  No Behavior/Cognition: Doesn't follow directions;Requires cueing;Confused Oral Cavity Assessment: Within Functional Limits Oral Care Completed by SLP: Recent completion by staff Oral Cavity - Dentition: Poor condition;Missing dentition Self-Feeding Abilities: Total assist Patient Positioning: Upright in bed Baseline Vocal Quality: Hoarse Volitional Cough: Cognitively unable to elicit Volitional Swallow: Unable to elicit    Oral/Motor/Sensory Function Overall Oral Motor/Sensory Function: Generalized oral weakness (grossly intact, no focal deficits)   Ice Chips Ice chips: Impaired Presentation: Spoon Oral Phase Impairments: Reduced lingual movement/coordination;Poor awareness of bolus Oral Phase Functional Implications: Prolonged oral transit Pharyngeal Phase Impairments: Suspected delayed Swallow   Thin Liquid Thin Liquid: Not tested    Nectar Thick Nectar Thick Liquid: Not tested   Honey Thick Honey Thick Liquid: Not tested   Puree Puree: Not tested   Solid     Solid: Not tested     Aliani Caccavale B. Dreama Saa, M.S., CCC-SLP, Tree surgeon Certified Brain Injury Specialist [facility]  [facility] Rehabilitation Services Office [phone] Ascom [phone] Fax [phone]

## 2023-09-17 NOTE — Progress Notes (Addendum)
Progress Note    Kaitlyn Hodges  HWE:[id number] DOB: Feb 22, 1961  DOA: 09/04/2023 PCP: Malva Limes, MD      Brief Narrative:    Medical records reviewed and are as summarized below:  Kaitlyn Hodges is a 63 y.o. female with medical history significant of prediabetes, CVA, seizure disorder, type I achalasia,tobacco use disorder, erosive esophagitis, who presented to the hospital with altered mental status.  Her daughter reported cough and shortness of breath for about a month prior to admission.  She also reported nausea, vomiting and poor oral intake for about a week prior to admission Patient was afebrile, hypotensive and tachycardic on vitals. She had a mild acidosis with increased AG. Noted lactate of 7.7. WBC of 32.9. BUN/cr 114/4.61. COVID, flu, RSV negative. Pan scan notable for possible L parotiditis, central airway thickening and patchy airspace opacities in the bases, age indeterminate T3 fracture.  Patient was started on broad spectrum antibiotics and fluid resuscitated.       Assessment/Plan:   Principal Problem:   Severe sepsis (HCC) Active Problems:   Acute renal failure (HCC)   Acute encephalopathy   Achalasia   Pre-diabetes   Seizure disorder (HCC)   History of CVA (cerebrovascular accident)   Closed wedge compression fracture of T3 vertebra (HCC)   Melena   Hemorrhagic shock (HCC)   Gastrointestinal hemorrhage   Lactic acidosis   Pressure injury of skin   Malnutrition of moderate degree   Upper GI bleed   Thrombocytopenia (HCC)   Hypophosphatemia   Acute hypoxic respiratory failure (HCC)   Duodenitis   Nutrition Problem: Moderate Malnutrition Etiology: chronic illness  Signs/Symptoms: mild fat depletion, moderate fat depletion, mild muscle depletion, moderate muscle depletion   Body mass index is 29 kg/m.   Severe sepsis. Community-acquired pneumonia. Acute hypoxic respiratory failure. Improved. Respiratory panel-negative. MRSA PCR  negative. Off Bipap and Levophed, PCCM signed off. HFNC 40% tapered to 8L nasal cannula, saturating 98% Oxygen requirement is down to 1 L/min. Initially treated with IV Zosyn but this was changed to IV cefepime because of suspicion of drug-induced ITP.  Cefepime finished 09/13/23. Urine cultures insignificant growth.  Blood cultures negative   B/l pleural effusions with compressive atelectasis. CTA chest done on 09/13/2023- negative for PE, new moderate to large bilateral pleural effusions with associated compressive atelectasis. S/p right-sided thoracentesis with removal of 600 mL of pleural fluid on 09/16/2023.  Repeat chest x-ray shows improvement in pleural effusions.    Hemorrhagic shock secondary to acute GI bleed, gastritis, duodenitis, oozing duodenal ulcer with visible vessel Acute blood loss anemia. S/p EGD on 09/08/2023, showed tortuous esophagus, diffuse inflammation in the esophagus and the small bowel which is not amenable to any endoscopic procedure.  Repeat EGD on 09/15/2023 showed tortuous esophagus, dilation in the entire esophagus, gastritis, duodenitis, oozing duodenal ulcer with a visible vessel.  Clips were placed.  She treated with argon plasma coagulation. Continue IV Protonix. Patient received blood transfusion on 09/07/23.  S/p transfusion with 1 unit of PRBCs on 09/15/2023 H&H is stable.   Thrombocytopenia: Improved.  Hematology consult appreciated. 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 Suspicion for TTP, DIC is low. 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 S/p transfusion with 2 units of platelets on 09/14/2023, 2 units of platelets on 07/12/2024 and 1 unit of platelet on 09/07/2023.      AKI (acute kidney injury) (HCC) 2/2 hypovolemia and severe sepsis. In the setting of poor p.o. intake, severe uncontrolled achalasia. Renal  function improving. Enteral nutrition and free water as able Stopped IV fluids 09/11/23. Avoid nephrotoxic drugs.     Hypokalemia Hypophosphatemia- Improved.  Replete as needed per protocol.    Hypernatremia Sodium has gone up again.  Sodium up from 146-151. Increase free water from 125 every 4 hours to every 2 hours for the next 24 hours.    Acute metabolic encephalopathy Overall, mental status is better but she is still confused.  MRI brain done 1/9 showed dural thickening versus thin subdural collection overlying the left cerebral hemisphere, measuring up to 3-4 mm in thickness. Neurosurgery reviewed imaging.  No indication for surgery. EEG showed moderate diffuse encephalopathy. No seizures or epileptiform discharges. Continue supportive care.    Pre-diabetes Sliding scale as needed.   Hypoglycemia protocol.    Closed wedge compression fracture of T3 vertebra (HCC) Incidental finding of a T3 compression fracture on imaging.  S/p fall prior to admission Continue pain management    History of CVA (cerebrovascular accident) CT of the head obtained showed chronic infarcts scattered along the left frontoparietal convexity in the left basal ganglia, as well as lacunar infarcts in the right thalamus. No acute findings.  Will consider aspirin, statin once stable.    Seizure disorder (HCC) History of seizure disorder since CVA in 2010.  No reported seizures since that time. EEG showed moderate diffuse encephalopathy. No seizures or epileptiform discharges. Continue seizure precautions.   Dysphagia, achalasia, nutrition She was reevaluated by speech therapist.  Unfortunately, patient could not have a safe diet at this point.  Continue enteral nutrition via NG tube for now.  Discussed PEG tube placement for long-term nutrition versus palliative care with Kaitlyn Hodges, daughter.  She wants to proceed with PEG tube placement. Gastroenterologist will be consulted to evaluate for PEG tube placement early next week since nonemergent procedures are not done over the weekend  Kaitlyn Hodges, daughter, said  patient has had achalasia for over 15 years.  She was told she needed surgery about 10 years ago but she was about 400 pounds at that time.  She was reevaluated about 5 years ago and was told she needed surgery.  She had been putting it off since then.    General Weakness PT and OT recommended discharge to SNF   Transfer from stepdown unit to progressive cardiac unit    Diet Order             Diet NPO time specified  Diet effective now                            Consultants: Hematologist Intensivist Gastroenterologist  Procedures: EGD on 09/08/2023 EGD on 09/15/2023    Medications:    Chlorhexidine Gluconate Cloth  6 each Topical Daily   vitamin B-12  1,000 mcg Per Tube Daily   free water  125 mL Per Tube Q2H   insulin aspart  0-9 Units Subcutaneous Q6H   nutrition supplement (JUVEN)  1 packet Per Tube BID BM   mouth rinse  15 mL Mouth Rinse 4 times per day   pantoprazole (PROTONIX) IV  40 mg Intravenous Q12H   sodium chloride flush  3 mL Intravenous Q12H   Continuous Infusions:  feeding supplement (OSMOLITE 1.5 CAL) 1,000 mL (09/17/23 1101)     Anti-infectives (From admission, onward)    Start     Dose/Rate Route Frequency Ordered Stop   09/12/23 1345  ceFEPIme (MAXIPIME) 2 g in sodium chloride 0.9 % 100  mL IVPB       Note to Pharmacy: Pharmacy to dose, heme/ onc advised to dc zosyn in the setting of dropping platelets   2 g 200 mL/hr over 30 Minutes Intravenous Every 12 hours 09/12/23 1255 09/14/23 0118   09/11/23 1315  ceFEPIme (MAXIPIME) 1 g in sodium chloride 0.9 % 100 mL IVPB  Status:  Discontinued       Note to Pharmacy: Pharmacy to dose, heme/ onc advised to dc zosyn in the setting of dropping platelets   1 g 200 mL/hr over 30 Minutes Intravenous Every 12 hours 09/11/23 1222 09/12/23 1255   09/08/23 1800  piperacillin-tazobactam (ZOSYN) IVPB 3.375 g  Status:  Discontinued        3.375 g 12.5 mL/hr over 240 Minutes Intravenous Every 8 hours  09/08/23 1629 09/11/23 1222   09/08/23 1800  vancomycin (VANCOREADY) IVPB 1500 mg/300 mL  Status:  Discontinued        1,500 mg 150 mL/hr over 120 Minutes Intravenous Every 48 hours 09/08/23 1629 09/09/23 0829   09/08/23 1702  vancomycin variable dose per unstable renal function (pharmacist dosing)  Status:  Discontinued         Does not apply See admin instructions 09/08/23 1702 09/09/23 0829   09/08/23 1000  cefTRIAXone (ROCEPHIN) 2 g in sodium chloride 0.9 % 100 mL IVPB  Status:  Discontinued        2 g 200 mL/hr over 30 Minutes Intravenous Every 24 hours 09/08/23 0852 09/08/23 1616   09/07/23 1000  doxycycline (VIBRAMYCIN) 100 mg in dextrose 5 % 250 mL IVPB  Status:  Discontinued        100 mg 125 mL/hr over 120 Minutes Intravenous Every 12 hours 09/07/23 0836 09/08/23 1616   09/07/23 0800  ceFEPIme (MAXIPIME) 2 g in sodium chloride 0.9 % 100 mL IVPB  Status:  Discontinued        2 g 200 mL/hr over 30 Minutes Intravenous Every 12 hours 09/07/23 0534 09/07/23 0538   09/07/23 0800  ceFEPIme (MAXIPIME) 2 g in sodium chloride 0.9 % 100 mL IVPB  Status:  Discontinued        2 g 200 mL/hr over 30 Minutes Intravenous Every 12 hours 09/07/23 0538 09/08/23 0852   09/06/23 1430  doxycycline (VIBRA-TABS) tablet 100 mg  Status:  Discontinued        100 mg Oral Every 12 hours 09/06/23 1418 09/07/23 0836   09/05/23 1200  ceFEPIme (MAXIPIME) 2 g in sodium chloride 0.9 % 100 mL IVPB  Status:  Discontinued        2 g 200 mL/hr over 30 Minutes Intravenous Every 24 hours 09/04/23 1450 09/05/23 0956   09/05/23 1000  cefTRIAXone (ROCEPHIN) 2 g in sodium chloride 0.9 % 100 mL IVPB  Status:  Discontinued        2 g 200 mL/hr over 30 Minutes Intravenous Every 24 hours 09/05/23 0956 09/07/23 0527   09/04/23 2200  metroNIDAZOLE (FLAGYL) IVPB 500 mg  Status:  Discontinued        500 mg 100 mL/hr over 60 Minutes Intravenous Every 12 hours 09/04/23 1416 09/08/23 1616   09/04/23 1600  vancomycin (VANCOREADY) IVPB  750 mg/150 mL        750 mg 150 mL/hr over 60 Minutes Intravenous  Once 09/04/23 1450 09/04/23 1611   09/04/23 1450  vancomycin variable dose per unstable renal function (pharmacist dosing)  Status:  Discontinued         Does not  apply See admin instructions 09/04/23 1450 09/06/23 1418   09/04/23 1130  ceFEPIme (MAXIPIME) 2 g in sodium chloride 0.9 % 100 mL IVPB        2 g 200 mL/hr over 30 Minutes Intravenous  Once 09/04/23 1123 09/04/23 1233   09/04/23 1130  metroNIDAZOLE (FLAGYL) IVPB 500 mg        500 mg 100 mL/hr over 60 Minutes Intravenous  Once 09/04/23 1123 09/04/23 1318   09/04/23 1130  vancomycin (VANCOCIN) IVPB 1000 mg/200 mL premix        1,000 mg 200 mL/hr over 60 Minutes Intravenous  Once 09/04/23 1123 09/04/23 1349              Family Communication/Anticipated D/C date and plan/Code Status   DVT prophylaxis:      Code Status: Full Code  Family Communication: Kaitlyn Hodges, daughter, over the phone Disposition Plan: Likely discharge to SNF   Status is: Inpatient Remains inpatient appropriate because: Respiratory failure, sepsis      Subjective:   Interval is noted.  She has no complaints.  Her speech is soft and difficult to understand.  Objective:    Vitals:   09/17/23 0900 09/17/23 1000 09/17/23 1100 09/17/23 1110  BP: (!) 99/57 107/68 106/64   Pulse: 86 85 87   Resp: (!) 9 14 14    Temp:      TempSrc:      SpO2: 90% 90% 93% 90%  Weight:      Height:       No data found.   Intake/Output Summary (Last 24 hours) at 09/17/2023 1358 Last data filed at 09/17/2023 0802 Gross per 24 hour  Intake 1073.5 ml  Output 1450 ml  Net -376.5 ml   Filed Weights   09/15/23 0500 09/16/23 0500 09/17/23 0710  Weight: 84.2 kg 81.5 kg 81.5 kg    Exam:  GEN: NAD SKIN: Warm and dry.  Chronic hyperpigmented lesions on bilateral groin.  Bruises on bilateral upper extremities EYES: No pallor or icterus ENT: MMM CV: RRR PULM: CTA B ABD: soft, ND, NT,  +BS CNS: Alert. Speech is soft and barely audible EXT: Edema of bilateral upper and lower extremities Rectal tube with black stools    Data Reviewed:   I have personally reviewed following labs and imaging studies:  Labs: Labs show the following:   Basic Metabolic Panel: Recent Labs  Lab 09/11/23 0213 09/12/23 0429 09/13/23 0322 09/14/23 0417 09/15/23 0418 09/16/23 0437 09/17/23 0417  NA 149* 143 143 144 148* 146* 151*  K 3.2* 3.8 3.6 3.7 3.5 4.2 4.0  CL 113* 102 105 109 112* 110 115*  CO2 29 29 28 28 29 26 29   GLUCOSE 200* 237* 162* 182* 81 268* 207*  BUN 81* 75* 66* 72* 71* 67* 56*  CREATININE 1.37* 1.30* 1.19* 1.22* 1.07* 1.14* 1.07*  CALCIUM 7.2* 7.1* 7.3* 7.4* 7.6* 7.5* 7.7*  MG 1.7 2.1  --  1.8  --  1.8  --   PHOS 1.6* 2.7  --  2.4* 3.9 3.7  --    GFR Estimated Creatinine Clearance: 57.9 mL/min (A) (by C-G formula based on SCr of 1.07 mg/dL (H)). Liver Function Tests: Recent Labs  Lab 09/11/23 0213 09/13/23 0322 09/16/23 0437  AST 17 26 24   ALT 16 18 21   ALKPHOS 158* 151* 149*  BILITOT 1.1 0.6 0.4  PROT 4.1* 4.5* 4.6*  ALBUMIN 1.8* 1.6* 1.6*   No results for input(s): "LIPASE", "AMYLASE" in the last 168 hours.  No results for input(s): "AMMONIA" in the last 168 hours. Coagulation profile Recent Labs  Lab 09/11/23 1337  INR 1.4*    CBC: Recent Labs  Lab 09/12/23 1503 09/13/23 0322 09/13/23 0644 09/14/23 0417 09/14/23 1558 09/15/23 0418 09/16/23 0437 09/17/23 0417  WBC 6.1   < > 7.0 6.6  --  6.9 7.8 6.5  NEUTROABS 5.2  --   --   --   --   --   --   --   HGB 10.6*   < > 9.6* 7.6* 8.0* 7.1* 8.3* 8.1*  HCT 32.1*   < > 28.0* 22.4* 24.4* 22.0* 25.2* 25.1*  MCV 92.2   < > 91.2 90.7  --  96.1 93.3 97.3  PLT 44*   < > 30* 33*  --  116* 119* 131*   < > = values in this interval not displayed.   Cardiac Enzymes: No results for input(s): "CKTOTAL", "CKMB", "CKMBINDEX", "TROPONINI" in the last 168 hours. BNP (last 3 results) No results for  input(s): "PROBNP" in the last 8760 hours. CBG: Recent Labs  Lab 09/16/23 1146 09/16/23 1750 09/17/23 0003 09/17/23 0703 09/17/23 1131  GLUCAP 118* 95 205* 202* 197*   D-Dimer: No results for input(s): "DDIMER" in the last 72 hours.  Hgb A1c: No results for input(s): "HGBA1C" in the last 72 hours. Lipid Profile: No results for input(s): "CHOL", "HDL", "LDLCALC", "TRIG", "CHOLHDL", "LDLDIRECT" in the last 72 hours. Thyroid function studies: No results for input(s): "TSH", "T4TOTAL", "T3FREE", "THYROIDAB" in the last 72 hours.  Invalid input(s): "FREET3" Anemia work up: No results for input(s): "VITAMINB12", "FOLATE", "FERRITIN", "TIBC", "IRON", "RETICCTPCT" in the last 72 hours.  Sepsis Labs: Recent Labs  Lab 09/14/23 0417 09/15/23 0418 09/16/23 0437 09/17/23 0417  WBC 6.6 6.9 7.8 6.5    Microbiology Recent Results (from the past 240 hours)  MRSA Next Gen by PCR, Nasal     Status: None   Collection Time: 09/08/23  4:20 PM   Specimen: Nasal Mucosa; Nasal Swab  Result Value Ref Range Status   MRSA by PCR Next Gen NOT DETECTED NOT DETECTED Final    Comment: (NOTE) The GeneXpert MRSA Assay (FDA approved for NASAL specimens only), is one component of a comprehensive MRSA colonization surveillance program. It is not intended to diagnose MRSA infection nor to guide or monitor treatment for MRSA infections. Test performance is not FDA approved in patients less than 73 years old. Performed at [facility], [address] [place], [place] [address]   Body fluid culture w Gram Stain     Status: None (Preliminary result)   Collection Time: 09/16/23  2:20 PM   Specimen: PATH Cytology Pleural fluid  Result Value Ref Range Status   Specimen Description   Final    FLUID Performed at [facility], [address], [place], [place] [address]    Special Requests   Final    CYTO PLEU Performed at [facility], [address] [place], [place]  [address]    Gram Stain   Final    RARE WBC PRESENT, PREDOMINANTLY MONONUCLEAR NO ORGANISMS SEEN    Culture   Final    NO GROWTH < 12 HOURS Performed at [facility], [address], [place], [place] [address]    Report Status PENDING  Incomplete    Procedures and diagnostic studies:  DG Chest Port 1 View Result Date: 09/16/2023 CLINICAL DATA:  Status post thoracentesis EXAM: PORTABLE CHEST 1 VIEW COMPARISON:  X-ray 09/12/2023 and older FINDINGS: Enteric tube in place extending beneath the diaphragm. Stable enlarged cardiopericardial silhouette with calcified tortuous aorta. Vascular congestion is improving. Pleural effusions are improving particularly on the right. No pneumothorax post thoracentesis. Persistent left retrocardiac opacity. Overlapping cardiac leads. IMPRESSION: Improving pleural effusions and opacities.  No pneumothorax Electronically Signed   By: Karen Kays M.D.   On: 09/16/2023 15:39   US THORACENTESIS ASP PLEURAL SPACE W/IMG GUIDE Result Date: 09/16/2023 INDICATION: 63 year old female with cough, shortness of breath, bilateral pleural effusions. Request made for thoracentesis. EXAM: ULTRASOUND GUIDED RIGHT DIAGNOSTIC AND THERAPEUTIC THORACENTESIS MEDICATIONS: 10 mL 1% lidocaine COMPLICATIONS: None immediate. PROCEDURE: An ultrasound guided thoracentesis was thoroughly discussed with the patient and questions answered. The benefits, risks, alternatives and complications were also discussed. The patient understands and wishes to proceed with the procedure. Written consent was obtained. Ultrasound was performed to localize and mark an adequate pocket of fluid in the right chest. The area was then prepped and draped in the normal sterile fashion. 1% Lidocaine was used for local anesthesia. Under ultrasound guidance a 6 Fr Safe-T-Centesis catheter was introduced. Thoracentesis was performed. The catheter was removed and a dressing applied. FINDINGS: A total of approximately 600  mL of yellow serous fluid was removed. Samples were sent to the laboratory as requested by the clinical team. IMPRESSION: Successful ultrasound guided diagnostic and therapeutic right thoracentesis yielding 600 mL of pleural fluid. Performed by: Loyce Dys PA-C Electronically Signed   By: Simonne Come M.D.   On: 09/16/2023 15:39                LOS: 13 days   Harlin Mazzoni  [facility]   Pager on [email or web address]. If 7PM-7AM, please contact night-coverage at [email or web address]     09/17/2023, 1:58 PM

## 2023-09-17 NOTE — TOC Initial Note (Signed)
Transition of Care [facility]) - Initial/Assessment Note    Patient Details  Name: Kaitlyn Hodges MRN: [id number] Date of Birth: 10-Jul-1961  Transition of Care [facility]) CM/SW Contact:    Allena Katz, LCSW Phone Number: 09/17/2023, 3:33 PM  Clinical Narrative:  Pt admitted from home with daughter with sepsis. Pt currently disoriented. CSW attempted to contact daughter regarding SNF recs but unable to reach. CSW will continue to follow up with family.                Expected Discharge Plan: Skilled Nursing Facility Barriers to Discharge: Continued Medical Work up   Patient Goals and CMS Choice       [place] ownership interest in [facility].provided to:: Adult Children    Expected Discharge Plan and Services     Post Acute Care Choice: Skilled Nursing Facility                                        Prior Living Arrangements/Services   Lives with:: Adult Children Patient language and need for interpreter reviewed:: Yes        Need for Family Participation in Patient Care: Yes (Comment) Care giver support system in place?: Yes (comment)      Activities of Daily Living   ADL Screening (condition at time of admission) Independently performs ADLs?: No Does the patient have a NEW difficulty with bathing/dressing/toileting/self-feeding that is expected to last >3 days?: No Does the patient have a NEW difficulty with getting in/out of bed, walking, or climbing stairs that is expected to last >3 days?: No Does the patient have a NEW difficulty with communication that is expected to last >3 days?: No Is the patient deaf or have difficulty hearing?: No Does the patient have difficulty seeing, even when wearing glasses/contacts?: No Does the patient have difficulty concentrating, remembering, or making decisions?: Yes  Permission Sought/Granted                  Emotional Assessment       Orientation: : Fluctuating Orientation (Suspected and/or reported  Sundowners)      Admission diagnosis:  Lactic acidosis [E87.20] Severe sepsis (HCC) [A41.9, R65.20] Acute renal failure, unspecified acute renal failure type (HCC) [N17.9] Patient Active Problem List   Diagnosis Date Noted   Duodenitis 09/16/2023   Acute hypoxic respiratory failure (HCC) 09/13/2023   Thrombocytopenia (HCC) 09/11/2023   Hypophosphatemia 09/11/2023   Malnutrition of moderate degree 09/09/2023   Upper GI bleed 09/09/2023   Melena 09/07/2023   Hemorrhagic shock (HCC) 09/07/2023   Gastrointestinal hemorrhage 09/07/2023   Lactic acidosis 09/07/2023   Pressure injury of skin 09/07/2023   Severe sepsis (HCC) 09/04/2023   Pre-diabetes 09/04/2023   Acute renal failure (HCC) 09/04/2023   Acute encephalopathy 09/04/2023   History of CVA (cerebrovascular accident) 09/04/2023   Closed wedge compression fracture of T3 vertebra (HCC) 09/04/2023   Achalasia 04/12/2020   Esophageal dysphagia    Erosive esophagitis    Food impaction of esophagus    Acute non-recurrent sinusitis 10/06/2019   Aortic atherosclerosis (HCC) 04/10/2019   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 [place] - [address] [place] [address] Phone: [phone] Fax: [phone]     Social Drivers of Health (SDOH) Social History: SDOH Screenings   Food Insecurity: No Food Insecurity (09/06/2023)  Housing: Low Risk  (09/06/2023)  Transportation Needs: No Transportation Needs (09/06/2023)  Utilities: Not At Risk (09/06/2023)  Alcohol Screen: Low Risk  (03/21/2021)  Depression (PHQ2-9): Medium Risk (03/21/2021)  Tobacco Use:  High Risk (09/15/2023)   SDOH Interventions:     Readmission Risk Interventions     No data to display

## 2023-09-17 NOTE — Plan of Care (Signed)
  Problem: Fluid Volume: Goal: Hemodynamic stability will improve Outcome: Progressing   Problem: Clinical Measurements: Goal: Diagnostic test results will improve Outcome: Progressing Goal: Signs and symptoms of infection will decrease Outcome: Progressing   Problem: Respiratory: Goal: Ability to maintain adequate ventilation will improve Outcome: Progressing   Problem: Clinical Measurements: Goal: Will remain free from infection Outcome: Progressing Goal: Diagnostic test results will improve Outcome: Progressing Goal: Respiratory complications will improve Outcome: Progressing Goal: Cardiovascular complication will be avoided Outcome: Progressing   Problem: Pain Management: Goal: General experience of comfort will improve Outcome: Progressing   Problem: Safety: Goal: Ability to remain free from injury will improve Outcome: Progressing   Problem: Metabolic: Goal: Ability to maintain appropriate glucose levels will improve Outcome: Progressing

## 2023-09-17 NOTE — Evaluation (Signed)
Occupational Therapy Evaluation Patient Details Name: Kaitlyn Hodges MRN: [id number] DOB: 09/19/1960 Today's Date: 09/17/2023   History of Present Illness Kaitlyn Hodges is a 63 y.o. female with medical history significant of prediabetes, CVA, seizure disorder, type I achalasia,tobacco use disorder, erosive esophagitis who presents to the ED due to altered mental status. Patient's daughter states that over the last 1 month, patient has been experiencing significant shortness of breath and cough that has progressively worsened.  Then for the last 1 week, she has been experiencing persistent nausea, vomiting and poor p.o. intake.   Clinical Impression   Kaitlyn Hodges was seen for OT evaluation this date. Prior to hospital admission, pt was IND. Pt lives with daughter. Pt currently requires TOTAL A for bed mobility, MIN A static sitting with BUE support. RUE 3/5, LUE 2/5 grossly. Pt oriented to name, follows less than 50% commands with no BLE movement noted. Pt would benefit from skilled OT to address noted impairments and functional limitations (see below for any additional details). Upon hospital discharge, recommend OT follow up.    If plan is discharge home, recommend the following: Two people to help with walking and/or transfers;Two people to help with bathing/dressing/bathroom    Functional Status Assessment  Patient has had a recent decline in their functional status and demonstrates the ability to make significant improvements in function in a reasonable and predictable amount of time.  Equipment Recommendations  Hospital bed;Hoyer lift    Recommendations for Other Services       Precautions / Restrictions Precautions Precautions: Fall Restrictions Weight Bearing Restrictions Per Provider Order: No      Mobility Bed Mobility Overal bed mobility: Needs Assistance Bed Mobility: Rolling, Supine to Sit, Sit to Supine Rolling: Total assist, +2 for physical assistance   Supine to sit: Total  assist, +2 for physical assistance Sit to supine: Total assist, +2 for physical assistance        Transfers                   General transfer comment: unable to attempt-patient unable to sit EOB unsupported      Balance Overall balance assessment: Needs assistance Sitting-balance support: Feet supported Sitting balance-Leahy Scale: Poor                                     ADL either performed or assessed with clinical judgement   ADL Overall ADL's : Needs assistance/impaired                                       General ADL Comments: MOD A simulated supine grooming tasks. TOTAL A seated grooming tasks      Pertinent Vitals/Pain Pain Assessment Pain Assessment: PAINAD Breathing: normal Negative Vocalization: occasional moan/groan, low speech, negative/disapproving quality Facial Expression: smiling or inexpressive Body Language: relaxed Consolability: no need to console PAINAD Score: 1     Extremity/Trunk Assessment Upper Extremity Assessment Upper Extremity Assessment: RUE deficits/detail;LUE deficits/detail RUE Deficits / Details: 3/5 grossly LUE Deficits / Details: 2/-5 grossly   Lower Extremity Assessment Lower Extremity Assessment: Difficult to assess due to impaired cognition RLE Deficits / Details: no active movement of LEs RLE Coordination: decreased gross motor LLE Deficits / Details: No active movement of LEs LLE Coordination: decreased gross motor   Cervical / Trunk Assessment Cervical /  Trunk Assessment: Other exceptions Cervical / Trunk Exceptions: forward flexed head in sitting edge of bed due to profound weakness   Communication Communication Communication: Difficulty communicating thoughts/reduced clarity of speech Cueing Techniques: Verbal cues;Gestural cues   Cognition Arousal: Lethargic Behavior During Therapy: Flat affect Overall Cognitive Status: Impaired/Different from baseline Area of  Impairment: Orientation, Following commands, Safety/judgement, Problem solving                 Orientation Level: Disoriented to, Place, Time, Situation     Following Commands: Follows one step commands inconsistently Safety/Judgement: Decreased awareness of safety, Decreased awareness of deficits   Problem Solving: Slow processing, Decreased initiation, Difficulty sequencing, Requires verbal cues, Requires tactile cues General Comments: pt states her name but minimally verbally responsive during session                Home Living Family/patient expects to be discharged to:: Private residence Living Arrangements: Children (lives with daughter, spouse passed away last year per daughter.) Available Help at Discharge: Family;Available PRN/intermittently Type of Home: Mobile home Home Access: Stairs to enter Entrance Stairs-Number of Steps: 6 Entrance Stairs-Rails: Right Home Layout: One level     Bathroom Shower/Tub: Tub/shower unit         Home Equipment: Hospital bed   Additional Comments: daughter reports they may have walker and such from her husband      Prior Functioning/Environment Prior Level of Function : Independent/Modified Independent;Driving             Mobility Comments: per daughter patient was very independent, driving, helping take care of 65 year old grandchild while dtr worked.  Did not use AD. Daughter states patient had no residual effects from CVA ADLs Comments: independent        OT Problem List: Decreased strength;Decreased range of motion;Decreased activity tolerance;Impaired balance (sitting and/or standing);Decreased safety awareness;Impaired UE functional use      OT Treatment/Interventions: Self-care/ADL training;Therapeutic exercise;Energy conservation;DME and/or AE instruction;Therapeutic activities;Balance training;Patient/family education    OT Goals(Current goals can be found in the care plan section) Acute Rehab OT  Goals Patient Stated Goal: none stated OT Goal Formulation: Patient unable to participate in goal setting Time For Goal Achievement: 10/01/23 Potential to Achieve Goals: Fair ADL Goals Pt Will Perform Grooming: with set-up;with supervision;sitting (tolerate >5 mins) Pt Will Perform Lower Body Dressing: with mod assist;sitting/lateral leans Pt Will Transfer to Toilet: with max assist;squat pivot transfer;bedside commode  OT Frequency: Min 1X/week    Co-evaluation PT/OT/SLP Co-Evaluation/Treatment: Yes Reason for Co-Treatment: Complexity of the patient's impairments (multi-system involvement);Necessary to address cognition/behavior during functional activity;For patient/therapist safety;To address functional/ADL transfers PT goals addressed during session: Mobility/safety with mobility;Balance OT goals addressed during session: ADL's and self-care      AM-PAC OT "6 Clicks" Daily Activity     Outcome Measure Help from another person eating meals?: A Little Help from another person taking care of personal grooming?: A Lot Help from another person toileting, which includes using toliet, bedpan, or urinal?: Total Help from another person bathing (including washing, rinsing, drying)?: A Lot Help from another person to put on and taking off regular upper body clothing?: A Lot Help from another person to put on and taking off regular lower body clothing?: Total 6 Click Score: 11   End of Session Nurse Communication: Mobility status  Activity Tolerance: Patient tolerated treatment well Patient left: in bed;with call bell/phone within reach  OT Visit Diagnosis: Other abnormalities of gait and mobility (R26.89);Muscle weakness (generalized) (M62.81)  Time: 0950-1002 OT Time Calculation (min): 12 min Charges:  OT General Charges $OT Visit: 1 Visit OT Evaluation $OT Eval Low Complexity: 1 Low  Kathie Dike, M.S. OTR/L  09/17/23, 1:20 PM  ascom [phone]

## 2023-09-17 NOTE — Plan of Care (Signed)
  Problem: Clinical Measurements: Goal: Will remain free from infection Outcome: Progressing   Problem: Clinical Measurements: Goal: Respiratory complications will improve Outcome: Progressing   Problem: Nutrition: Goal: Adequate nutrition will be maintained Outcome: Progressing   Problem: Pain Management: Goal: General experience of comfort will improve Outcome: Progressing   Problem: Safety: Goal: Ability to remain free from injury will improve Outcome: Progressing

## 2023-09-18 DIAGNOSIS — R6520 Severe sepsis without septic shock: Secondary | ICD-10-CM | POA: Diagnosis not present

## 2023-09-18 DIAGNOSIS — A419 Sepsis, unspecified organism: Secondary | ICD-10-CM | POA: Diagnosis not present

## 2023-09-18 LAB — GLUCOSE, CAPILLARY
Glucose-Capillary: 182 mg/dL — ABNORMAL HIGH (ref 70–99)
Glucose-Capillary: 203 mg/dL — ABNORMAL HIGH (ref 70–99)
Glucose-Capillary: 206 mg/dL — ABNORMAL HIGH (ref 70–99)
Glucose-Capillary: 227 mg/dL — ABNORMAL HIGH (ref 70–99)
Glucose-Capillary: 240 mg/dL — ABNORMAL HIGH (ref 70–99)
Glucose-Capillary: 243 mg/dL — ABNORMAL HIGH (ref 70–99)

## 2023-09-18 LAB — BASIC METABOLIC PANEL
Anion gap: 6 (ref 5–15)
BUN: 58 mg/dL — ABNORMAL HIGH (ref 8–23)
CO2: 27 mmol/L (ref 22–32)
Calcium: 8.1 mg/dL — ABNORMAL LOW (ref 8.9–10.3)
Chloride: 119 mmol/L — ABNORMAL HIGH (ref 98–111)
Creatinine, Ser: 0.9 mg/dL (ref 0.44–1.00)
GFR, Estimated: >60 mL/min (ref >60)
Glucose, Bld: 195 mg/dL — ABNORMAL HIGH (ref 70–99)
Potassium: 4.2 mmol/L (ref 3.5–5.1)
Sodium: 152 mmol/L — ABNORMAL HIGH (ref 135–145)

## 2023-09-18 LAB — CBC
HCT: 24.8 % — ABNORMAL LOW (ref 36.0–46.0)
Hemoglobin: 8.1 g/dL — ABNORMAL LOW (ref 12.0–15.0)
MCH: 30.8 pg (ref 26.0–34.0)
MCHC: 32.7 g/dL (ref 30.0–36.0)
MCV: 94.3 fL (ref 80.0–100.0)
Platelets: 155 10*3/uL (ref 150–400)
RBC: 2.63 MIL/uL — ABNORMAL LOW (ref 3.87–5.11)
RDW: 15.5 % (ref 11.5–15.5)
WBC: 6.4 10*3/uL (ref 4.0–10.5)
nRBC: 0 % (ref 0.0–0.2)

## 2023-09-18 MED ORDER — FREE WATER
200.0000 mL | Status: AC
  Administered 2023-09-18 – 2023-09-19 (×6): 200 mL

## 2023-09-18 MED ORDER — FREE WATER: 125.0000 mL | Status: DC

## 2023-09-18 NOTE — Progress Notes (Signed)
Pt blood sugar at 182. Glucometer not sinking yet. Will continue to monitor.

## 2023-09-18 NOTE — Progress Notes (Signed)
Progress Note    Kaitlyn Hodges  EXB:[id number] DOB: [birth year]  DOA: 09/04/2023 PCP: Malva Limes, MD      Brief Narrative:    Medical records reviewed and are as summarized below:  Kaitlyn Hodges is a 63 y.o. female with medical history significant of prediabetes, CVA, seizure disorder, type I achalasia,tobacco use disorder, erosive esophagitis, who presented to the hospital with altered mental status.  Her daughter reported cough and shortness of breath for about a month prior to admission.  She also reported nausea, vomiting and poor oral intake for about a week prior to admission Patient was afebrile, hypotensive and tachycardic on vitals. She had a mild acidosis with increased AG. Noted lactate of 7.7. WBC of 32.9. BUN/cr 114/4.61. COVID, flu, RSV negative. Pan scan notable for possible L parotiditis, central airway thickening and patchy airspace opacities in the bases, age indeterminate T3 fracture.  Patient was started on broad spectrum antibiotics and fluid resuscitated.       Assessment/Plan:   Principal Problem:   Severe sepsis (HCC) Active Problems:   Acute renal failure (HCC)   Acute encephalopathy   Achalasia   Pre-diabetes   Seizure disorder (HCC)   History of CVA (cerebrovascular accident)   Closed wedge compression fracture of T3 vertebra (HCC)   Melena   Hemorrhagic shock (HCC)   Gastrointestinal hemorrhage   Lactic acidosis   Pressure injury of skin   Malnutrition of moderate degree   Upper GI bleed   Thrombocytopenia (HCC)   Hypophosphatemia   Acute hypoxic respiratory failure (HCC)   Duodenitis   Nutrition Problem: Moderate Malnutrition Etiology: chronic illness  Signs/Symptoms: mild fat depletion, moderate fat depletion, mild muscle depletion, moderate muscle depletion   Body mass index is 27.47 kg/m.   Severe sepsis. Community-acquired pneumonia. Acute hypoxic respiratory failure. Improved.  She is tolerating room air. Respiratory  panel-negative. MRSA PCR negative. Off Bipap and Levophed, PCCM signed off. HFNC 40% tapered to 8L nasal cannula Initially treated with IV Zosyn but this was changed to IV cefepime because of suspicion of drug-induced ITP.  Cefepime finished 09/13/23. Urine cultures insignificant growth.  Blood cultures negative   B/l pleural effusions with compressive atelectasis. CTA chest done on 09/13/2023- negative for PE, new moderate to large bilateral pleural effusions with associated compressive atelectasis. S/p right-sided thoracentesis with removal of 600 mL of pleural fluid on 09/16/2023.  Repeat chest x-ray shows improvement in pleural effusions.    Hemorrhagic shock secondary to acute GI bleed, gastritis, duodenitis, oozing duodenal ulcer with visible vessel Acute blood loss anemia. S/p EGD on 09/08/2023, showed tortuous esophagus, diffuse inflammation in the esophagus and the small bowel which is not amenable to any endoscopic procedure.  Repeat EGD on 09/15/2023 showed tortuous esophagus, dilation in the entire esophagus, gastritis, duodenitis, oozing duodenal ulcer with a visible vessel.  Clips were placed.  She treated with argon plasma coagulation. Change IV to oral Protonix. Patient received blood transfusion on 09/07/23.  S/p transfusion with 1 unit of PRBCs on 09/15/2023 H&H is stable.   Thrombocytopenia: Resolved.  Hematology consult appreciated. 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 Suspicion for TTP, DIC is low. 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 S/p transfusion with 2 units of platelets on 09/14/2023, 2 units of platelets on 07/12/2024 and 1 unit of platelet on 09/07/2023.      AKI (acute kidney injury) (HCC) Improved 2/2 hypovolemia and severe sepsis. In the setting of poor p.o. intake, severe uncontrolled achalasia.  Enteral nutrition and free water as able Stopped IV fluids 09/11/23. Avoid nephrotoxic drugs.    Hypokalemia Hypophosphatemia- Improved.  Replete as needed per  protocol.    Hypernatremia Sodium is still high.  Sodium up from 146-151-152. Change free water 200 ml every 4 hours for the next 24 hours.    Acute metabolic encephalopathy Overall, mental status is better but she is still confused.  Not sure whether this will be a new baseline.  MRI brain done 1/9 showed dural thickening versus thin subdural collection overlying the left cerebral hemisphere, measuring up to 3-4 mm in thickness. Neurosurgery reviewed imaging.  No indication for surgery. EEG showed moderate diffuse encephalopathy. No seizures or epileptiform discharges. Continue supportive care.    Pre-diabetes Sliding scale as needed.   Hypoglycemia protocol.    Closed wedge compression fracture of T3 vertebra (HCC) Incidental finding of a T3 compression fracture on imaging.  S/p fall prior to admission Analgesics as needed for pain    History of CVA (cerebrovascular accident) CT of the head obtained showed chronic infarcts scattered along the left frontoparietal convexity in the left basal ganglia, as well as lacunar infarcts in the right thalamus. No acute findings.  Will consider aspirin, statin once stable.    Seizure disorder (HCC) History of seizure disorder since CVA in 2010.  No reported seizures since that time. EEG showed moderate diffuse encephalopathy. No seizures or epileptiform discharges. Continue seizure precautions.   Dysphagia, achalasia, nutrition She was reevaluated by speech therapist.  Unfortunately, patient can't have a safe diet at this point.  Continue enteral nutrition via NG tube for now.   Plan for PEG tube placement after the weekend.  Patient and daughter are agreeable with PEG tube placement. Gastroenterologist will be consulted early next week for PEG tube placement.   [name], daughter, said patient has had achalasia for over 15 years.  She was told she needed surgery about 10 years ago but she was about 400 pounds at that time.  She was  reevaluated about 5 years ago and was told she needed surgery.  She had been putting it off since then.    General Weakness PT and OT recommended discharge to SNF       Diet Order             Diet NPO time specified  Diet effective now                            Consultants: Hematologist Intensivist Gastroenterologist  Procedures: EGD on 09/08/2023 EGD on 09/15/2023    Medications:    Chlorhexidine Gluconate Cloth  6 each Topical Daily   vitamin B-12  1,000 mcg Per Tube Daily   free water  200 mL Per Tube Q4H   insulin aspart  0-9 Units Subcutaneous Q6H   nutrition supplement (JUVEN)  1 packet Per Tube BID BM   mouth rinse  15 mL Mouth Rinse 4 times per day   pantoprazole (PROTONIX) IV  40 mg Intravenous Q12H   sodium chloride flush  3 mL Intravenous Q12H   Continuous Infusions:  feeding supplement (OSMOLITE 1.5 CAL) 1,000 mL (09/18/23 0620)     Anti-infectives (From admission, onward)    Start     Dose/Rate Route Frequency Ordered Stop   09/12/23 1345  ceFEPIme (MAXIPIME) 2 g in sodium chloride 0.9 % 100 mL IVPB       Note to Pharmacy: Pharmacy to dose, heme/ onc  advised to dc zosyn in the setting of dropping platelets   2 g 200 mL/hr over 30 Minutes Intravenous Every 12 hours 09/12/23 1255 09/14/23 0118   09/11/23 1315  ceFEPIme (MAXIPIME) 1 g in sodium chloride 0.9 % 100 mL IVPB  Status:  Discontinued       Note to Pharmacy: Pharmacy to dose, heme/ onc advised to dc zosyn in the setting of dropping platelets   1 g 200 mL/hr over 30 Minutes Intravenous Every 12 hours 09/11/23 1222 09/12/23 1255   09/08/23 1800  piperacillin-tazobactam (ZOSYN) IVPB 3.375 g  Status:  Discontinued        3.375 g 12.5 mL/hr over 240 Minutes Intravenous Every 8 hours 09/08/23 1629 09/11/23 1222   09/08/23 1800  vancomycin (VANCOREADY) IVPB 1500 mg/300 mL  Status:  Discontinued        1,500 mg 150 mL/hr over 120 Minutes Intravenous Every 48 hours 09/08/23 1629  09/09/23 0829   09/08/23 1702  vancomycin variable dose per unstable renal function (pharmacist dosing)  Status:  Discontinued         Does not apply See admin instructions 09/08/23 1702 09/09/23 0829   09/08/23 1000  cefTRIAXone (ROCEPHIN) 2 g in sodium chloride 0.9 % 100 mL IVPB  Status:  Discontinued        2 g 200 mL/hr over 30 Minutes Intravenous Every 24 hours 09/08/23 0852 09/08/23 1616   09/07/23 1000  doxycycline (VIBRAMYCIN) 100 mg in dextrose 5 % 250 mL IVPB  Status:  Discontinued        100 mg 125 mL/hr over 120 Minutes Intravenous Every 12 hours 09/07/23 0836 09/08/23 1616   09/07/23 0800  ceFEPIme (MAXIPIME) 2 g in sodium chloride 0.9 % 100 mL IVPB  Status:  Discontinued        2 g 200 mL/hr over 30 Minutes Intravenous Every 12 hours 09/07/23 0534 09/07/23 0538   09/07/23 0800  ceFEPIme (MAXIPIME) 2 g in sodium chloride 0.9 % 100 mL IVPB  Status:  Discontinued        2 g 200 mL/hr over 30 Minutes Intravenous Every 12 hours 09/07/23 0538 09/08/23 0852   09/06/23 1430  doxycycline (VIBRA-TABS) tablet 100 mg  Status:  Discontinued        100 mg Oral Every 12 hours 09/06/23 1418 09/07/23 0836   09/05/23 1200  ceFEPIme (MAXIPIME) 2 g in sodium chloride 0.9 % 100 mL IVPB  Status:  Discontinued        2 g 200 mL/hr over 30 Minutes Intravenous Every 24 hours 09/04/23 1450 09/05/23 0956   09/05/23 1000  cefTRIAXone (ROCEPHIN) 2 g in sodium chloride 0.9 % 100 mL IVPB  Status:  Discontinued        2 g 200 mL/hr over 30 Minutes Intravenous Every 24 hours 09/05/23 0956 09/07/23 0527   09/04/23 2200  metroNIDAZOLE (FLAGYL) IVPB 500 mg  Status:  Discontinued        500 mg 100 mL/hr over 60 Minutes Intravenous Every 12 hours 09/04/23 1416 09/08/23 1616   09/04/23 1600  vancomycin (VANCOREADY) IVPB 750 mg/150 mL        750 mg 150 mL/hr over 60 Minutes Intravenous  Once 09/04/23 1450 09/04/23 1611   09/04/23 1450  vancomycin variable dose per unstable renal function (pharmacist dosing)   Status:  Discontinued         Does not apply See admin instructions 09/04/23 1450 09/06/23 1418   09/04/23 1130  ceFEPIme (MAXIPIME) 2  g in sodium chloride 0.9 % 100 mL IVPB        2 g 200 mL/hr over 30 Minutes Intravenous  Once 09/04/23 1123 09/04/23 1233   09/04/23 1130  metroNIDAZOLE (FLAGYL) IVPB 500 mg        500 mg 100 mL/hr over 60 Minutes Intravenous  Once 09/04/23 1123 09/04/23 1318   09/04/23 1130  vancomycin (VANCOCIN) IVPB 1000 mg/200 mL premix        1,000 mg 200 mL/hr over 60 Minutes Intravenous  Once 09/04/23 1123 09/04/23 1349              Family Communication/Anticipated D/C date and plan/Code Status   DVT prophylaxis:      Code Status: Full Code  Family Communication: None Disposition Plan: Likely discharge to SNF   Status is: Inpatient Remains inpatient appropriate because: Needs PEG tube for long-term nutrition      Subjective:   Interval events noted.  No complaints.  Objective:    Vitals:   09/18/23 0415 09/18/23 0711 09/18/23 0741 09/18/23 1146  BP: 104/65  111/68 115/81  Pulse:   92 93  Resp:   17 20  Temp: 98.2 F (36.8 C)  99.1 F (37.3 C) 99.1 F (37.3 C)  TempSrc: Oral  Oral   SpO2: 100%  98% 96%  Weight:  77.2 kg    Height:       No data found.   Intake/Output Summary (Last 24 hours) at 09/18/2023 1152 Last data filed at 09/18/2023 1018 Gross per 24 hour  Intake 498 ml  Output 1900 ml  Net -1402 ml   Filed Weights   09/16/23 0500 09/17/23 0710 09/18/23 0711  Weight: 81.5 kg 81.5 kg 77.2 kg    Exam:   GEN: NAD SKIN: Warm and dry EYES: No pallor or icterus ENT: MMM CV: RRR PULM: CTA B ABD: soft, some abdominal distention, NT, +BS CNS: AAO x 1 (person), speech is soft and slightly slurred EXT: Edema of bilateral upper extremities and bilateral thighs Rectal tube in place with dark stools     Data Reviewed:   I have personally reviewed following labs and imaging studies:  Labs: Labs show the  following:   Basic Metabolic Panel: Recent Labs  Lab 09/12/23 0429 09/13/23 0322 09/14/23 0417 09/15/23 0418 09/16/23 0437 09/17/23 0417 09/18/23 0525  NA 143   < > 144 148* 146* 151* 152*  K 3.8   < > 3.7 3.5 4.2 4.0 4.2  CL 102   < > 109 112* 110 115* 119*  CO2 29   < > 28 29 26 29 27   GLUCOSE 237*   < > 182* 81 268* 207* 195*  BUN 75*   < > 72* 71* 67* 56* 58*  CREATININE 1.30*   < > 1.22* 1.07* 1.14* 1.07* 0.90  CALCIUM 7.1*   < > 7.4* 7.6* 7.5* 7.7* 8.1*  MG 2.1  --  1.8  --  1.8  --   --   PHOS 2.7  --  2.4* 3.9 3.7  --   --    < > = values in this interval not displayed.   GFR Estimated Creatinine Clearance: 67.2 mL/min (by C-G formula based on SCr of 0.9 mg/dL). Liver Function Tests: Recent Labs  Lab 09/13/23 0322 09/16/23 0437  AST 26 24  ALT 18 21  ALKPHOS 151* 149*  BILITOT 0.6 0.4  PROT 4.5* 4.6*  ALBUMIN 1.6* 1.6*   No results for input(s): "LIPASE", "  AMYLASE" in the last 168 hours. No results for input(s): "AMMONIA" in the last 168 hours. Coagulation profile Recent Labs  Lab 09/11/23 1337  INR 1.4*    CBC: Recent Labs  Lab 09/12/23 1503 09/13/23 0322 09/14/23 0417 09/14/23 1558 09/15/23 0418 09/16/23 0437 09/17/23 0417 09/18/23 0525  WBC 6.1   < > 6.6  --  6.9 7.8 6.5 6.4  NEUTROABS 5.2  --   --   --   --   --   --   --   HGB 10.6*   < > 7.6* 8.0* 7.1* 8.3* 8.1* 8.1*  HCT 32.1*   < > 22.4* 24.4* 22.0* 25.2* 25.1* 24.8*  MCV 92.2   < > 90.7  --  96.1 93.3 97.3 94.3  PLT 44*   < > 33*  --  116* 119* 131* 155   < > = values in this interval not displayed.   Cardiac Enzymes: No results for input(s): "CKTOTAL", "CKMB", "CKMBINDEX", "TROPONINI" in the last 168 hours. BNP (last 3 results) No results for input(s): "PROBNP" in the last 8760 hours. CBG: Recent Labs  Lab 09/17/23 1707 09/17/23 2042 09/18/23 0011 09/18/23 0506 09/18/23 0745  GLUCAP 203* 195* 227* 182* 203*   D-Dimer: No results for input(s): "DDIMER" in the last 72  hours.  Hgb A1c: No results for input(s): "HGBA1C" in the last 72 hours. Lipid Profile: No results for input(s): "CHOL", "HDL", "LDLCALC", "TRIG", "CHOLHDL", "LDLDIRECT" in the last 72 hours. Thyroid function studies: No results for input(s): "TSH", "T4TOTAL", "T3FREE", "THYROIDAB" in the last 72 hours.  Invalid input(s): "FREET3" Anemia work up: No results for input(s): "VITAMINB12", "FOLATE", "FERRITIN", "TIBC", "IRON", "RETICCTPCT" in the last 72 hours.  Sepsis Labs: Recent Labs  Lab 09/15/23 0418 09/16/23 0437 09/17/23 0417 09/18/23 0525  WBC 6.9 7.8 6.5 6.4    Microbiology Recent Results (from the past 240 hours)  MRSA Next Gen by PCR, Nasal     Status: None   Collection Time: 09/08/23  4:20 PM   Specimen: Nasal Mucosa; Nasal Swab  Result Value Ref Range Status   MRSA by PCR Next Gen NOT DETECTED NOT DETECTED Final    Comment: (NOTE) The GeneXpert MRSA Assay (FDA approved for NASAL specimens only), is one component of a comprehensive MRSA colonization surveillance program. It is not intended to diagnose MRSA infection nor to guide or monitor treatment for MRSA infections. Test performance is not FDA approved in patients less than 14 years old. Performed at [facility], [address] [place], [place] [address]   Body fluid culture w Gram Stain     Status: None (Preliminary result)   Collection Time: 09/16/23  2:20 PM   Specimen: PATH Cytology Pleural fluid  Result Value Ref Range Status   Specimen Description   Final    FLUID Performed at [facility], [address], [place], [place] [address]    Special Requests   Final    CYTO PLEU Performed at [facility], [address] [place], [place] [address]    Gram Stain   Final    RARE WBC PRESENT, PREDOMINANTLY MONONUCLEAR NO ORGANISMS SEEN    Culture   Final    NO GROWTH 2 DAYS Performed at [facility], [address] [place], [place] [address]    Report Status  PENDING  Incomplete    Procedures and diagnostic studies:  DG Chest Port 1 View Result Date: 09/16/2023 CLINICAL DATA:  Status post thoracentesis EXAM:  PORTABLE CHEST 1 VIEW COMPARISON:  X-ray 09/12/2023 and older FINDINGS: Enteric tube in place extending beneath the diaphragm. Stable enlarged cardiopericardial silhouette with calcified tortuous aorta. Vascular congestion is improving. Pleural effusions are improving particularly on the right. No pneumothorax post thoracentesis. Persistent left retrocardiac opacity. Overlapping cardiac leads. IMPRESSION: Improving pleural effusions and opacities.  No pneumothorax Electronically Signed   By: Karen Kays M.D.   On: 09/16/2023 15:39   US THORACENTESIS ASP PLEURAL SPACE W/IMG GUIDE Result Date: 09/16/2023 INDICATION: 63 year old female with cough, shortness of breath, bilateral pleural effusions. Request made for thoracentesis. EXAM: ULTRASOUND GUIDED RIGHT DIAGNOSTIC AND THERAPEUTIC THORACENTESIS MEDICATIONS: 10 mL 1% lidocaine COMPLICATIONS: None immediate. PROCEDURE: An ultrasound guided thoracentesis was thoroughly discussed with the patient and questions answered. The benefits, risks, alternatives and complications were also discussed. The patient understands and wishes to proceed with the procedure. Written consent was obtained. Ultrasound was performed to localize and mark an adequate pocket of fluid in the right chest. The area was then prepped and draped in the normal sterile fashion. 1% Lidocaine was used for local anesthesia. Under ultrasound guidance a 6 Fr Safe-T-Centesis catheter was introduced. Thoracentesis was performed. The catheter was removed and a dressing applied. FINDINGS: A total of approximately 600 mL of yellow serous fluid was removed. Samples were sent to the laboratory as requested by the clinical team. IMPRESSION: Successful ultrasound guided diagnostic and therapeutic right thoracentesis yielding 600 mL of pleural fluid.  Performed by: Loyce Dys PA-C Electronically Signed   By: Simonne Come M.D.   On: 09/16/2023 15:39                LOS: 14 days   Tomeka Kantner  [facility]   Pager on [email or web address]. If 7PM-7AM, please contact night-coverage at [email or web address]     09/18/2023, 11:52 AM

## 2023-09-18 NOTE — Plan of Care (Signed)
  Problem: Clinical Measurements: Goal: Diagnostic test results will improve Outcome: Progressing Goal: Signs and symptoms of infection will decrease Outcome: Progressing   Problem: Clinical Measurements: Goal: Will remain free from infection Outcome: Progressing Goal: Diagnostic test results will improve Outcome: Progressing   Problem: Activity: Goal: Risk for activity intolerance will decrease Outcome: Progressing   Problem: Nutrition: Goal: Adequate nutrition will be maintained Outcome: Progressing   Problem: Safety: Goal: Ability to remain free from injury will improve Outcome: Progressing   Problem: Skin Integrity: Goal: Risk for impaired skin integrity will decrease Outcome: Progressing

## 2023-09-19 DIAGNOSIS — R6520 Severe sepsis without septic shock: Secondary | ICD-10-CM | POA: Diagnosis not present

## 2023-09-19 DIAGNOSIS — A419 Sepsis, unspecified organism: Secondary | ICD-10-CM | POA: Diagnosis not present

## 2023-09-19 LAB — BASIC METABOLIC PANEL
Anion gap: 5 (ref 5–15)
BUN: 57 mg/dL — ABNORMAL HIGH (ref 8–23)
CO2: 25 mmol/L (ref 22–32)
Calcium: 7.9 mg/dL — ABNORMAL LOW (ref 8.9–10.3)
Chloride: 118 mmol/L — ABNORMAL HIGH (ref 98–111)
Creatinine, Ser: 0.79 mg/dL (ref 0.44–1.00)
GFR, Estimated: >60 mL/min (ref >60)
Glucose, Bld: 225 mg/dL — ABNORMAL HIGH (ref 70–99)
Potassium: 4.7 mmol/L (ref 3.5–5.1)
Sodium: 148 mmol/L — ABNORMAL HIGH (ref 135–145)

## 2023-09-19 LAB — GLUCOSE, CAPILLARY
Glucose-Capillary: 131 mg/dL — ABNORMAL HIGH (ref 70–99)
Glucose-Capillary: 185 mg/dL — ABNORMAL HIGH (ref 70–99)
Glucose-Capillary: 186 mg/dL — ABNORMAL HIGH (ref 70–99)
Glucose-Capillary: 190 mg/dL — ABNORMAL HIGH (ref 70–99)
Glucose-Capillary: 199 mg/dL — ABNORMAL HIGH (ref 70–99)
Glucose-Capillary: 204 mg/dL — ABNORMAL HIGH (ref 70–99)
Glucose-Capillary: 219 mg/dL — ABNORMAL HIGH (ref 70–99)

## 2023-09-19 MED ORDER — FREE WATER
200.0000 mL | Freq: Four times a day (QID) | Status: DC
  Administered 2023-09-19 – 2023-09-30 (×43): 200 mL

## 2023-09-19 NOTE — Plan of Care (Signed)
  Problem: Clinical Measurements: Goal: Diagnostic test results will improve Outcome: Progressing Goal: Signs and symptoms of infection will decrease Outcome: Progressing   Problem: Respiratory: Goal: Ability to maintain adequate ventilation will improve Outcome: Progressing   Problem: Education: Goal: Knowledge of General Education information will improve Description: Including pain rating scale, medication(s)/side effects and non-pharmacologic comfort measures Outcome: Progressing   Problem: Health Behavior/Discharge Planning: Goal: Ability to manage health-related needs will improve Outcome: Progressing   Problem: Clinical Measurements: Goal: Will remain free from infection Outcome: Progressing Goal: Cardiovascular complication will be avoided Outcome: Progressing   Problem: Nutrition: Goal: Adequate nutrition will be maintained Outcome: Progressing

## 2023-09-19 NOTE — Progress Notes (Signed)
Progress Note    Kaitlyn Hodges  YQM:[id number] DOB: Aug 02, 1961  DOA: 09/04/2023 PCP: Malva Limes, MD      Brief Narrative:    Medical records reviewed and are as summarized below:  Kaitlyn Hodges is a 63 y.o. female with medical history significant of prediabetes, CVA, seizure disorder, type I achalasia,tobacco use disorder, erosive esophagitis, who presented to the hospital with altered mental status.  Her daughter reported cough and shortness of breath for about a month prior to admission.  She also reported nausea, vomiting and poor oral intake for about a week prior to admission Patient was afebrile, hypotensive and tachycardic on vitals. She had a mild acidosis with increased AG. Noted lactate of 7.7. WBC of 32.9. BUN/cr 114/4.61. COVID, flu, RSV negative. Pan scan notable for possible L parotiditis, central airway thickening and patchy airspace opacities in the bases, age indeterminate T3 fracture.  Patient was started on broad spectrum antibiotics and fluid resuscitated.       Assessment/Plan:   Principal Problem:   Severe sepsis (HCC) Active Problems:   Acute renal failure (HCC)   Acute encephalopathy   Achalasia   Pre-diabetes   Seizure disorder (HCC)   History of CVA (cerebrovascular accident)   Closed wedge compression fracture of T3 vertebra (HCC)   Melena   Hemorrhagic shock (HCC)   Gastrointestinal hemorrhage   Lactic acidosis   Pressure injury of skin   Malnutrition of moderate degree   Upper GI bleed   Thrombocytopenia (HCC)   Hypophosphatemia   Acute hypoxic respiratory failure (HCC)   Duodenitis   Nutrition Problem: Moderate Malnutrition Etiology: chronic illness  Signs/Symptoms: mild fat depletion, moderate fat depletion, mild muscle depletion, moderate muscle depletion   Body mass index is 28.57 kg/m.   Severe sepsis. Community-acquired pneumonia. Acute hypoxic respiratory failure. Improved.  She is tolerating room air. Respiratory  panel-negative. MRSA PCR negative. Off Bipap and Levophed, PCCM signed off. HFNC 40% tapered to 8L nasal cannula Initially treated with IV Zosyn but this was changed to IV cefepime because of suspicion of drug-induced ITP.  Cefepime finished 09/13/23. Urine cultures insignificant growth.  Blood cultures negative   B/l pleural effusions with compressive atelectasis. CTA chest done on 09/13/2023- negative for PE, new moderate to large bilateral pleural effusions with associated compressive atelectasis. S/p right-sided thoracentesis with removal of 600 mL of pleural fluid on 09/16/2023.  Repeat chest x-ray shows improvement in pleural effusions.    Hemorrhagic shock secondary to acute GI bleed, gastritis, duodenitis, oozing duodenal ulcer with visible vessel Acute blood loss anemia. S/p EGD on 09/08/2023, showed tortuous esophagus, diffuse inflammation in the esophagus and the small bowel which is not amenable to any endoscopic procedure.  Repeat EGD on 09/15/2023 showed tortuous esophagus, dilation in the entire esophagus, gastritis, duodenitis, oozing duodenal ulcer with a visible vessel.  Clips were placed.  She treated with argon plasma coagulation. Continue IV Protonix Patient received blood transfusion on 09/07/23.  S/p transfusion with 1 unit of PRBCs on 09/15/2023 H&H is stable.   Thrombocytopenia: Resolved.  Hematology consult appreciated. 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 Suspicion for TTP, DIC is low. 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 S/p transfusion with 2 units of platelets on 09/14/2023, 2 units of platelets on 07/12/2024 and 1 unit of platelet on 09/07/2023.      AKI (acute kidney injury) (HCC) Improved 2/2 hypovolemia and severe sepsis. In the setting of poor p.o. intake, severe uncontrolled achalasia.  Enteral nutrition  and free water as able Stopped IV fluids 09/11/23. Avoid nephrotoxic drugs.    Hypokalemia Hypophosphatemia- Improved.  Replete as needed per  protocol.    Hypernatremia Slowly improving.  Sodium up from [id number]. Continue free water with decreased dose from 200 mL to every 6 hours.    Acute metabolic encephalopathy Overall, mental status is better but she is still confused.  Not sure whether this will be a new baseline.  MRI brain done 1/9 showed dural thickening versus thin subdural collection overlying the left cerebral hemisphere, measuring up to 3-4 mm in thickness. Neurosurgery reviewed imaging.  No indication for surgery. EEG showed moderate diffuse encephalopathy. No seizures or epileptiform discharges. Continue supportive care.    Pre-diabetes Sliding scale as needed.   Hypoglycemia protocol.    Closed wedge compression fracture of T3 vertebra (HCC) Incidental finding of a T3 compression fracture on imaging.  S/p fall prior to admission Analgesics as needed for pain    History of CVA (cerebrovascular accident) CT of the head obtained showed chronic infarcts scattered along the left frontoparietal convexity in the left basal ganglia, as well as lacunar infarcts in the right thalamus. No acute findings.  Will consider aspirin, statin once stable.    Seizure disorder (HCC) History of seizure disorder since CVA in 2010.  No reported seizures since that time. EEG showed moderate diffuse encephalopathy. No seizures or epileptiform discharges. Continue seizure precautions.   Dysphagia, achalasia, nutrition She was reevaluated by speech therapist.  Unfortunately, patient can't have a safe diet at this point.  Continue enteral nutrition via NG tube for now.   Consult GI tomorrow to evaluate patient for PEG tube placement.   [name], daughter, said patient has had achalasia for over 15 years.  She was told she needed surgery about 10 years ago but she was about 400 pounds at that time.  She was reevaluated about 5 years ago and was told she needed surgery.  She had been putting it off since  then.    General Weakness PT and OT recommended discharge to SNF       Diet Order             Diet NPO time specified  Diet effective now                            Consultants: Hematologist Intensivist Gastroenterologist  Procedures: EGD on 09/08/2023 EGD on 09/15/2023    Medications:    Chlorhexidine Gluconate Cloth  6 each Topical Daily   vitamin B-12  1,000 mcg Per Tube Daily   insulin aspart  0-9 Units Subcutaneous Q6H   nutrition supplement (JUVEN)  1 packet Per Tube BID BM   mouth rinse  15 mL Mouth Rinse 4 times per day   pantoprazole (PROTONIX) IV  40 mg Intravenous Q12H   sodium chloride flush  3 mL Intravenous Q12H   Continuous Infusions:  feeding supplement (OSMOLITE 1.5 CAL) 1,000 mL (09/19/23 0204)     Anti-infectives (From admission, onward)    Start     Dose/Rate Route Frequency Ordered Stop   09/12/23 1345  ceFEPIme (MAXIPIME) 2 g in sodium chloride 0.9 % 100 mL IVPB       Note to Pharmacy: Pharmacy to dose, heme/ onc advised to dc zosyn in the setting of dropping platelets   2 g 200 mL/hr over 30 Minutes Intravenous Every 12 hours 09/12/23 1255 09/14/23 0118   09/11/23 1315  ceFEPIme (  MAXIPIME) 1 g in sodium chloride 0.9 % 100 mL IVPB  Status:  Discontinued       Note to Pharmacy: Pharmacy to dose, heme/ onc advised to dc zosyn in the setting of dropping platelets   1 g 200 mL/hr over 30 Minutes Intravenous Every 12 hours 09/11/23 1222 09/12/23 1255   09/08/23 1800  piperacillin-tazobactam (ZOSYN) IVPB 3.375 g  Status:  Discontinued        3.375 g 12.5 mL/hr over 240 Minutes Intravenous Every 8 hours 09/08/23 1629 09/11/23 1222   09/08/23 1800  vancomycin (VANCOREADY) IVPB 1500 mg/300 mL  Status:  Discontinued        1,500 mg 150 mL/hr over 120 Minutes Intravenous Every 48 hours 09/08/23 1629 09/09/23 0829   09/08/23 1702  vancomycin variable dose per unstable renal function (pharmacist dosing)  Status:  Discontinued          Does not apply See admin instructions 09/08/23 1702 09/09/23 0829   09/08/23 1000  cefTRIAXone (ROCEPHIN) 2 g in sodium chloride 0.9 % 100 mL IVPB  Status:  Discontinued        2 g 200 mL/hr over 30 Minutes Intravenous Every 24 hours 09/08/23 0852 09/08/23 1616   09/07/23 1000  doxycycline (VIBRAMYCIN) 100 mg in dextrose 5 % 250 mL IVPB  Status:  Discontinued        100 mg 125 mL/hr over 120 Minutes Intravenous Every 12 hours 09/07/23 0836 09/08/23 1616   09/07/23 0800  ceFEPIme (MAXIPIME) 2 g in sodium chloride 0.9 % 100 mL IVPB  Status:  Discontinued        2 g 200 mL/hr over 30 Minutes Intravenous Every 12 hours 09/07/23 0534 09/07/23 0538   09/07/23 0800  ceFEPIme (MAXIPIME) 2 g in sodium chloride 0.9 % 100 mL IVPB  Status:  Discontinued        2 g 200 mL/hr over 30 Minutes Intravenous Every 12 hours 09/07/23 0538 09/08/23 0852   09/06/23 1430  doxycycline (VIBRA-TABS) tablet 100 mg  Status:  Discontinued        100 mg Oral Every 12 hours 09/06/23 1418 09/07/23 0836   09/05/23 1200  ceFEPIme (MAXIPIME) 2 g in sodium chloride 0.9 % 100 mL IVPB  Status:  Discontinued        2 g 200 mL/hr over 30 Minutes Intravenous Every 24 hours 09/04/23 1450 09/05/23 0956   09/05/23 1000  cefTRIAXone (ROCEPHIN) 2 g in sodium chloride 0.9 % 100 mL IVPB  Status:  Discontinued        2 g 200 mL/hr over 30 Minutes Intravenous Every 24 hours 09/05/23 0956 09/07/23 0527   09/04/23 2200  metroNIDAZOLE (FLAGYL) IVPB 500 mg  Status:  Discontinued        500 mg 100 mL/hr over 60 Minutes Intravenous Every 12 hours 09/04/23 1416 09/08/23 1616   09/04/23 1600  vancomycin (VANCOREADY) IVPB 750 mg/150 mL        750 mg 150 mL/hr over 60 Minutes Intravenous  Once 09/04/23 1450 09/04/23 1611   09/04/23 1450  vancomycin variable dose per unstable renal function (pharmacist dosing)  Status:  Discontinued         Does not apply See admin instructions 09/04/23 1450 09/06/23 1418   09/04/23 1130  ceFEPIme (MAXIPIME) 2 g in  sodium chloride 0.9 % 100 mL IVPB        2 g 200 mL/hr over 30 Minutes Intravenous  Once 09/04/23 1123 09/04/23 1233   09/04/23  1130  metroNIDAZOLE (FLAGYL) IVPB 500 mg        500 mg 100 mL/hr over 60 Minutes Intravenous  Once 09/04/23 1123 09/04/23 1318   09/04/23 1130  vancomycin (VANCOCIN) IVPB 1000 mg/200 mL premix        1,000 mg 200 mL/hr over 60 Minutes Intravenous  Once 09/04/23 1123 09/04/23 1349              Family Communication/Anticipated D/C date and plan/Code Status   DVT prophylaxis:      Code Status: Full Code  Family Communication: None Disposition Plan: Likely discharge to SNF   Status is: Inpatient Remains inpatient appropriate because: Needs PEG tube for long-term nutrition      Subjective:   Interval events noted.  She does not provide much history.  She still has some confusion.  Speech is soft and difficult to understand.  Objective:    Vitals:   09/19/23 0445 09/19/23 0500 09/19/23 0750 09/19/23 1121  BP: 98/64  127/85 121/82  Pulse: 98  99 (!) 104  Resp: 14  16 16   Temp: 98.4 F (36.9 C)  98.2 F (36.8 C) 98.2 F (36.8 C)  TempSrc: Oral     SpO2: 95%  98% 96%  Weight:  80.3 kg    Height:       No data found.   Intake/Output Summary (Last 24 hours) at 09/19/2023 1400 Last data filed at 09/19/2023 1308 Gross per 24 hour  Intake 973 ml  Output 1800 ml  Net -827 ml   Filed Weights   09/17/23 0710 09/18/23 0711 09/19/23 0500  Weight: 81.5 kg 77.2 kg 80.3 kg    Exam:  GEN: NAD SKIN: Warm and dry EYES: No pallor or icterus ENT: MMM CV: RRR PULM: CTA B ABD: soft, mild abdominal distension/fluid, NT, +BS CNS: AAO x 1 (person),   EXT: No edema or tenderness      Data Reviewed:   I have personally reviewed following labs and imaging studies:  Labs: Labs show the following:   Basic Metabolic Panel: Recent Labs  Lab 09/14/23 0417 09/15/23 0418 09/16/23 0437 09/17/23 0417 09/18/23 0525 09/19/23 0527  NA  144 148* 146* 151* 152* 148*  K 3.7 3.5 4.2 4.0 4.2 4.7  CL 109 112* 110 115* 119* 118*  CO2 28 29 26 29 27 25   GLUCOSE 182* 81 268* 207* 195* 225*  BUN 72* 71* 67* 56* 58* 57*  CREATININE 1.22* 1.07* 1.14* 1.07* 0.90 0.79  CALCIUM 7.4* 7.6* 7.5* 7.7* 8.1* 7.9*  MG 1.8  --  1.8  --   --   --   PHOS 2.4* 3.9 3.7  --   --   --    GFR Estimated Creatinine Clearance: 76.9 mL/min (by C-G formula based on SCr of 0.79 mg/dL). Liver Function Tests: Recent Labs  Lab 09/13/23 0322 09/16/23 0437  AST 26 24  ALT 18 21  ALKPHOS 151* 149*  BILITOT 0.6 0.4  PROT 4.5* 4.6*  ALBUMIN 1.6* 1.6*   No results for input(s): "LIPASE", "AMYLASE" in the last 168 hours. No results for input(s): "AMMONIA" in the last 168 hours. Coagulation profile No results for input(s): "INR", "PROTIME" in the last 168 hours.   CBC: Recent Labs  Lab 09/12/23 1503 09/13/23 0322 09/14/23 0417 09/14/23 1558 09/15/23 0418 09/16/23 0437 09/17/23 0417 09/18/23 0525  WBC 6.1   < > 6.6  --  6.9 7.8 6.5 6.4  NEUTROABS 5.2  --   --   --   --   --   --   --  HGB 10.6*   < > 7.6* 8.0* 7.1* 8.3* 8.1* 8.1*  HCT 32.1*   < > 22.4* 24.4* 22.0* 25.2* 25.1* 24.8*  MCV 92.2   < > 90.7  --  96.1 93.3 97.3 94.3  PLT 44*   < > 33*  --  116* 119* 131* 155   < > = values in this interval not displayed.   Cardiac Enzymes: No results for input(s): "CKTOTAL", "CKMB", "CKMBINDEX", "TROPONINI" in the last 168 hours. BNP (last 3 results) No results for input(s): "PROBNP" in the last 8760 hours. CBG: Recent Labs  Lab 09/18/23 1714 09/18/23 2333 09/19/23 0611 09/19/23 0742 09/19/23 1116  GLUCAP 240* 206* 186* 190* 219*   D-Dimer: No results for input(s): "DDIMER" in the last 72 hours.  Hgb A1c: No results for input(s): "HGBA1C" in the last 72 hours. Lipid Profile: No results for input(s): "CHOL", "HDL", "LDLCALC", "TRIG", "CHOLHDL", "LDLDIRECT" in the last 72 hours. Thyroid function studies: No results for input(s):  "TSH", "T4TOTAL", "T3FREE", "THYROIDAB" in the last 72 hours.  Invalid input(s): "FREET3" Anemia work up: No results for input(s): "VITAMINB12", "FOLATE", "FERRITIN", "TIBC", "IRON", "RETICCTPCT" in the last 72 hours.  Sepsis Labs: Recent Labs  Lab 09/15/23 0418 09/16/23 0437 09/17/23 0417 09/18/23 0525  WBC 6.9 7.8 6.5 6.4    Microbiology Recent Results (from the past 240 hours)  Body fluid culture w Gram Stain     Status: None (Preliminary result)   Collection Time: 09/16/23  2:20 PM   Specimen: PATH Cytology Pleural fluid  Result Value Ref Range Status   Specimen Description   Final    FLUID Performed at [facility], [address] [place], [place] [address]    Special Requests   Final    CYTO PLEU Performed at [facility], [address] [place], [place] [address]    Gram Stain   Final    RARE WBC PRESENT, PREDOMINANTLY MONONUCLEAR NO ORGANISMS SEEN    Culture   Final    NO GROWTH 3 DAYS Performed at [facility], [address], [place], [place] [address]    Report Status PENDING  Incomplete    Procedures and diagnostic studies:  No results found.               LOS: 15 days   Kaitlyn Hodges  [employer]ered loss adjuster on [email or web address]. If 7PM-7AM, please contact night-coverage at [email or web address]     09/19/2023, 2:00 PM

## 2023-09-19 NOTE — Plan of Care (Signed)
  Problem: Respiratory: Goal: Ability to maintain adequate ventilation will improve Outcome: Progressing   Problem: Pain Management: Goal: General experience of comfort will improve Outcome: Progressing   Problem: Safety: Goal: Ability to remain free from injury will improve Outcome: Progressing   Problem: Nutrition: Goal: Adequate nutrition will be maintained Outcome: Progressing

## 2023-09-20 DIAGNOSIS — Z4659 Encounter for fitting and adjustment of other gastrointestinal appliance and device: Secondary | ICD-10-CM

## 2023-09-20 DIAGNOSIS — A419 Sepsis, unspecified organism: Secondary | ICD-10-CM | POA: Diagnosis not present

## 2023-09-20 DIAGNOSIS — R6520 Severe sepsis without septic shock: Secondary | ICD-10-CM | POA: Diagnosis not present

## 2023-09-20 LAB — GLUCOSE, CAPILLARY
Glucose-Capillary: 195 mg/dL — ABNORMAL HIGH (ref 70–99)
Glucose-Capillary: 177 mg/dL — ABNORMAL HIGH (ref 70–99)
Glucose-Capillary: 178 mg/dL — ABNORMAL HIGH (ref 70–99)
Glucose-Capillary: 235 mg/dL — ABNORMAL HIGH (ref 70–99)
Glucose-Capillary: 131 mg/dL — ABNORMAL HIGH (ref 70–99)

## 2023-09-20 LAB — BODY FLUID CULTURE W GRAM STAIN: Culture: NO GROWTH

## 2023-09-20 NOTE — Progress Notes (Signed)
                                                     Palliative Care Progress Note   Patient Name: Kaitlyn Hodges       Date: 09/20/2023 DOB: [birth year]  Age: 63 y.o. MRN#: [id number] Attending Physician: Lurene Shadow, MD Primary Care Physician: Malva Limes, MD Admit Date: 09/04/2023  Extensive chart review completed including labs, vital signs, imaging, progress notes, orders, and available advanced directive documentation.  Plan is for PEG tube placement tomorrow.  Full code and full scope remain.  PMT remains available to patient and family throughout her hospitalization.  We will shadow her chart and monitor her peripherally.  Please reengage when appropriate.  Thank you for allowing the Palliative Medicine Team to assist in the care of Kaitlyn Davidoff.  Samara Deist L. Bonita Quin, DNP, FNP-BC Palliative Medicine Team    No charge

## 2023-09-20 NOTE — Plan of Care (Signed)
  Problem: Fluid Volume: Goal: Hemodynamic stability will improve Outcome: Progressing   Problem: Clinical Measurements: Goal: Diagnostic test results will improve Outcome: Progressing   Problem: Respiratory: Goal: Ability to maintain adequate ventilation will improve Outcome: Progressing   Problem: Health Behavior/Discharge Planning: Goal: Ability to manage health-related needs will improve Outcome: Progressing

## 2023-09-20 NOTE — Progress Notes (Signed)
Progress Note    Kaitlyn Hodges  ONG:[id number] DOB: [birth year]  DOA: 09/04/2023 PCP: Kaitlyn Limes, MD      Brief Narrative:    Medical records reviewed and are as summarized below:  Kaitlyn Hodges is a 63 y.o. female with medical history significant of prediabetes, CVA, seizure disorder, type I achalasia,tobacco use disorder, erosive esophagitis, who presented to the hospital with altered mental status.  Her daughter reported cough and shortness of breath for about a month prior to admission.  She also reported nausea, vomiting and poor oral intake for about a week prior to admission Patient was afebrile, hypotensive and tachycardic on vitals. She had a mild acidosis with increased AG. Noted lactate of 7.7. WBC of 32.9. BUN/cr 114/4.61. COVID, flu, RSV negative. Pan scan notable for possible L parotiditis, central airway thickening and patchy airspace opacities in the bases, age indeterminate T3 fracture.  Patient was started on broad spectrum antibiotics and fluid resuscitated.       Assessment/Plan:   Principal Problem:   Severe sepsis (HCC) Active Problems:   Acute renal failure (HCC)   Acute encephalopathy   Achalasia   Pre-diabetes   Seizure disorder (HCC)   History of CVA (cerebrovascular accident)   Closed wedge compression fracture of T3 vertebra (HCC)   Melena   Hemorrhagic shock (HCC)   Gastrointestinal hemorrhage   Lactic acidosis   Pressure injury of skin   Malnutrition of moderate degree   Upper GI bleed   Thrombocytopenia (HCC)   Hypophosphatemia   Acute hypoxic respiratory failure (HCC)   Duodenitis   Nutrition Problem: Moderate Malnutrition Etiology: chronic illness  Signs/Symptoms: mild fat depletion, moderate fat depletion, mild muscle depletion, moderate muscle depletion   Body mass index is 28.5 kg/m.   Severe sepsis. Community-acquired pneumonia. Acute hypoxic respiratory failure. Improved.  She is tolerating room air. Respiratory  panel-negative. MRSA PCR negative. Off Bipap and Levophed, PCCM signed off. HFNC 40% tapered to 8L nasal cannula Initially treated with IV Zosyn but this was changed to IV cefepime because of suspicion of drug-induced ITP.  Cefepime finished 09/13/23. Urine cultures insignificant growth.  Blood cultures negative   B/l pleural effusions with compressive atelectasis. CTA chest done on 09/13/2023- negative for PE, new moderate to large bilateral pleural effusions with associated compressive atelectasis. S/p right-sided thoracentesis with removal of 600 mL of pleural fluid on 09/16/2023.  Repeat chest x-ray shows improvement in pleural effusions.    Hemorrhagic shock secondary to acute GI bleed, gastritis, duodenitis, oozing duodenal ulcer with visible vessel Acute blood loss anemia. S/p EGD on 09/08/2023, showed tortuous esophagus, diffuse inflammation in the esophagus and the small bowel which is not amenable to any endoscopic procedure.  Repeat EGD on 09/15/2023 showed tortuous esophagus, dilation in the entire esophagus, gastritis, duodenitis, oozing duodenal ulcer with a visible vessel.  Clips were placed.  She treated with argon plasma coagulation. Continue IV Protonix Patient received blood transfusion on 09/07/23.  S/p transfusion with 1 unit of PRBCs on 09/15/2023 H&H is stable. No antiplatelets or anticoagulants for now   Thrombocytopenia: Resolved.  Hematology consult appreciated. 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 Suspicion for TTP, DIC is low. 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 S/p transfusion with 2 units of platelets on 09/14/2023, 2 units of platelets on 07/12/2024 and 1 unit of platelet on 09/07/2023.      AKI (acute kidney injury) (HCC) Improved 2/2 hypovolemia and severe sepsis. In the setting of poor p.o. intake,  severe uncontrolled achalasia.  Enteral nutrition and free water as able Stopped IV fluids 09/11/23. Avoid nephrotoxic drugs.     Hypokalemia Hypophosphatemia- Improved.  Replete as needed per protocol.    Hypernatremia Slowly improving.  Sodium up from [phone]. Continue free water with decreased dose from 200 mL to every 6 hours.    Acute metabolic encephalopathy Overall, mental status is better but she is still confused.  Not sure whether this will be a new baseline.  MRI brain done 1/9 showed dural thickening versus thin subdural collection overlying the left cerebral hemisphere, measuring up to 3-4 mm in thickness. Neurosurgery reviewed imaging.  No indication for surgery. EEG showed moderate diffuse encephalopathy. No seizures or epileptiform discharges. Continue supportive care.    Pre-diabetes Sliding scale as needed.   Hypoglycemia protocol.    Closed wedge compression fracture of T3 vertebra (HCC) Incidental finding of a T3 compression fracture on imaging.  S/p fall prior to admission Analgesics as needed for pain    History of CVA (cerebrovascular accident) CT of the head obtained showed chronic infarcts scattered along the left frontoparietal convexity in the left basal ganglia, as well as lacunar infarcts in the right thalamus. No acute findings.     Seizure disorder (HCC) History of seizure disorder since CVA in 2010.  No reported seizures since that time. EEG showed moderate diffuse encephalopathy. No seizures or epileptiform discharges. Continue seizure precautions.   Dysphagia, achalasia, nutrition She was reevaluated by speech therapist.  Unfortunately, patient can't have a safe diet at this point.  Continue enteral nutrition via NG tube for now.   Consulted Dr. Servando Snare, gastroenterologist to evaluate patient for PEG tube placement.  Noted plan for PEG tube placement tomorrow.   Kaitlyn Hodges, daughter, said patient has had achalasia for over 15 years.  She was told she needed surgery about 10 years ago but she was about 400 pounds at that time.  She was reevaluated about 5 years ago  and was told she needed surgery.  She had been putting it off since then.    General Weakness PT and OT recommended discharge to SNF       Diet Order             Diet NPO time specified  Diet effective midnight                            Consultants: Hematologist Intensivist Gastroenterologist  Procedures: EGD on 09/08/2023 EGD on 09/15/2023    Medications:    Chlorhexidine Gluconate Cloth  6 each Topical Daily   vitamin B-12  1,000 mcg Per Tube Daily   free water  200 mL Per Tube Q6H   insulin aspart  0-9 Units Subcutaneous Q6H   nutrition supplement (JUVEN)  1 packet Per Tube BID BM   mouth rinse  15 mL Mouth Rinse 4 times per day   pantoprazole (PROTONIX) IV  40 mg Intravenous Q12H   sodium chloride flush  3 mL Intravenous Q12H   Continuous Infusions:  feeding supplement (OSMOLITE 1.5 CAL) 1,000 mL (09/20/23 0054)     Anti-infectives (From admission, onward)    Start     Dose/Rate Route Frequency Ordered Stop   09/12/23 1345  ceFEPIme (MAXIPIME) 2 g in sodium chloride 0.9 % 100 mL IVPB       Note to Pharmacy: Pharmacy to dose, heme/ onc advised to dc zosyn in the setting of dropping platelets   2 g  200 mL/hr over 30 Minutes Intravenous Every 12 hours 09/12/23 1255 09/14/23 0118   09/11/23 1315  ceFEPIme (MAXIPIME) 1 g in sodium chloride 0.9 % 100 mL IVPB  Status:  Discontinued       Note to Pharmacy: Pharmacy to dose, heme/ onc advised to dc zosyn in the setting of dropping platelets   1 g 200 mL/hr over 30 Minutes Intravenous Every 12 hours 09/11/23 1222 09/12/23 1255   09/08/23 1800  piperacillin-tazobactam (ZOSYN) IVPB 3.375 g  Status:  Discontinued        3.375 g 12.5 mL/hr over 240 Minutes Intravenous Every 8 hours 09/08/23 1629 09/11/23 1222   09/08/23 1800  vancomycin (VANCOREADY) IVPB 1500 mg/300 mL  Status:  Discontinued        1,500 mg 150 mL/hr over 120 Minutes Intravenous Every 48 hours 09/08/23 1629 09/09/23 0829   09/08/23 1702   vancomycin variable dose per unstable renal function (pharmacist dosing)  Status:  Discontinued         Does not apply See admin instructions 09/08/23 1702 09/09/23 0829   09/08/23 1000  cefTRIAXone (ROCEPHIN) 2 g in sodium chloride 0.9 % 100 mL IVPB  Status:  Discontinued        2 g 200 mL/hr over 30 Minutes Intravenous Every 24 hours 09/08/23 0852 09/08/23 1616   09/07/23 1000  doxycycline (VIBRAMYCIN) 100 mg in dextrose 5 % 250 mL IVPB  Status:  Discontinued        100 mg 125 mL/hr over 120 Minutes Intravenous Every 12 hours 09/07/23 0836 09/08/23 1616   09/07/23 0800  ceFEPIme (MAXIPIME) 2 g in sodium chloride 0.9 % 100 mL IVPB  Status:  Discontinued        2 g 200 mL/hr over 30 Minutes Intravenous Every 12 hours 09/07/23 0534 09/07/23 0538   09/07/23 0800  ceFEPIme (MAXIPIME) 2 g in sodium chloride 0.9 % 100 mL IVPB  Status:  Discontinued        2 g 200 mL/hr over 30 Minutes Intravenous Every 12 hours 09/07/23 0538 09/08/23 0852   09/06/23 1430  doxycycline (VIBRA-TABS) tablet 100 mg  Status:  Discontinued        100 mg Oral Every 12 hours 09/06/23 1418 09/07/23 0836   09/05/23 1200  ceFEPIme (MAXIPIME) 2 g in sodium chloride 0.9 % 100 mL IVPB  Status:  Discontinued        2 g 200 mL/hr over 30 Minutes Intravenous Every 24 hours 09/04/23 1450 09/05/23 0956   09/05/23 1000  cefTRIAXone (ROCEPHIN) 2 g in sodium chloride 0.9 % 100 mL IVPB  Status:  Discontinued        2 g 200 mL/hr over 30 Minutes Intravenous Every 24 hours 09/05/23 0956 09/07/23 0527   09/04/23 2200  metroNIDAZOLE (FLAGYL) IVPB 500 mg  Status:  Discontinued        500 mg 100 mL/hr over 60 Minutes Intravenous Every 12 hours 09/04/23 1416 09/08/23 1616   09/04/23 1600  vancomycin (VANCOREADY) IVPB 750 mg/150 mL        750 mg 150 mL/hr over 60 Minutes Intravenous  Once 09/04/23 1450 09/04/23 1611   09/04/23 1450  vancomycin variable dose per unstable renal function (pharmacist dosing)  Status:  Discontinued         Does  not apply See admin instructions 09/04/23 1450 09/06/23 1418   09/04/23 1130  ceFEPIme (MAXIPIME) 2 g in sodium chloride 0.9 % 100 mL IVPB  2 g 200 mL/hr over 30 Minutes Intravenous  Once 09/04/23 1123 09/04/23 1233   09/04/23 1130  metroNIDAZOLE (FLAGYL) IVPB 500 mg        500 mg 100 mL/hr over 60 Minutes Intravenous  Once 09/04/23 1123 09/04/23 1318   09/04/23 1130  vancomycin (VANCOCIN) IVPB 1000 mg/200 mL premix        1,000 mg 200 mL/hr over 60 Minutes Intravenous  Once 09/04/23 1123 09/04/23 1349              Family Communication/Anticipated D/C date and plan/Code Status   DVT prophylaxis:      Code Status: Full Code  Family Communication: None Disposition Plan: Likely discharge to SNF   Status is: Inpatient Remains inpatient appropriate because: Needs PEG tube for long-term nutrition      Subjective:   No acute events overnight.  She has no complaints.  She is confused and cannot provide any meaningful history  Objective:    Vitals:   09/20/23 0415 09/20/23 0743 09/20/23 1106 09/20/23 1545  BP: 106/74 117/70 116/72 122/73  Pulse: 92 87 87 90  Resp: 20 16 18 17   Temp: (!) 97.4 F (36.3 C) 98.1 F (36.7 C) 97.8 F (36.6 C) 98.2 F (36.8 C)  TempSrc:      SpO2: 100% 94% 98% 98%  Weight:      Height:       No data found.   Intake/Output Summary (Last 24 hours) at 09/20/2023 1728 Last data filed at 09/20/2023 1430 Gross per 24 hour  Intake 2577.25 ml  Output 2200 ml  Net 377.25 ml   Filed Weights   09/18/23 0711 09/19/23 0500 09/20/23 0410  Weight: 77.2 kg 80.3 kg 80.1 kg    Exam:  GEN: NAD SKIN: Warm and dry EYES: No pallor or icterus ENT: MMM CV: RRR PULM: CTA B ABD: soft, ND, NT, +BS CNS: AAO x 1 (person),  EXT: Edema of the upper extremities has improved. Rectal tube with dark stools   Data Reviewed:   I have personally reviewed following labs and imaging studies:  Labs: Labs show the following:   Basic  Metabolic Panel: Recent Labs  Lab 09/14/23 0417 09/15/23 0418 09/16/23 0437 09/17/23 0417 09/18/23 0525 09/19/23 0527  NA 144 148* 146* 151* 152* 148*  K 3.7 3.5 4.2 4.0 4.2 4.7  CL 109 112* 110 115* 119* 118*  CO2 28 29 26 29 27 25   GLUCOSE 182* 81 268* 207* 195* 225*  BUN 72* 71* 67* 56* 58* 57*  CREATININE 1.22* 1.07* 1.14* 1.07* 0.90 0.79  CALCIUM 7.4* 7.6* 7.5* 7.7* 8.1* 7.9*  MG 1.8  --  1.8  --   --   --   PHOS 2.4* 3.9 3.7  --   --   --    GFR Estimated Creatinine Clearance: 76.8 mL/min (by C-G formula based on SCr of 0.79 mg/dL). Liver Function Tests: Recent Labs  Lab 09/16/23 0437  AST 24  ALT 21  ALKPHOS 149*  BILITOT 0.4  PROT 4.6*  ALBUMIN 1.6*   No results for input(s): "LIPASE", "AMYLASE" in the last 168 hours. No results for input(s): "AMMONIA" in the last 168 hours. Coagulation profile No results for input(s): "INR", "PROTIME" in the last 168 hours.   CBC: Recent Labs  Lab 09/14/23 0417 09/14/23 1558 09/15/23 0418 09/16/23 0437 09/17/23 0417 09/18/23 0525  WBC 6.6  --  6.9 7.8 6.5 6.4  HGB 7.6* 8.0* 7.1* 8.3* 8.1* 8.1*  HCT  22.4* 24.4* 22.0* 25.2* 25.1* 24.8*  MCV 90.7  --  96.1 93.3 97.3 94.3  PLT 33*  --  116* 119* 131* 155   Cardiac Enzymes: No results for input(s): "CKTOTAL", "CKMB", "CKMBINDEX", "TROPONINI" in the last 168 hours. BNP (last 3 results) No results for input(s): "PROBNP" in the last 8760 hours. CBG: Recent Labs  Lab 09/19/23 2345 09/20/23 0536 09/20/23 0734 09/20/23 1101 09/20/23 1540  GLUCAP 131* 195* 177* 178* 235*   D-Dimer: No results for input(s): "DDIMER" in the last 72 hours.  Hgb A1c: No results for input(s): "HGBA1C" in the last 72 hours. Lipid Profile: No results for input(s): "CHOL", "HDL", "LDLCALC", "TRIG", "CHOLHDL", "LDLDIRECT" in the last 72 hours. Thyroid function studies: No results for input(s): "TSH", "T4TOTAL", "T3FREE", "THYROIDAB" in the last 72 hours.  Invalid input(s):  "FREET3" Anemia work up: No results for input(s): "VITAMINB12", "FOLATE", "FERRITIN", "TIBC", "IRON", "RETICCTPCT" in the last 72 hours.  Sepsis Labs: Recent Labs  Lab 09/15/23 0418 09/16/23 0437 09/17/23 0417 09/18/23 0525  WBC 6.9 7.8 6.5 6.4    Microbiology Recent Results (from the past 240 hours)  Body fluid culture w Gram Stain     Status: None   Collection Time: 09/16/23  2:20 PM   Specimen: PATH Cytology Pleural fluid  Result Value Ref Range Status   Specimen Description   Final    FLUID Performed at [facility], [address] [place], [place] [address]    Special Requests   Final    CYTO PLEU Performed at [facility], [address] [place], [place] [address]    Gram Stain   Final    RARE WBC PRESENT, PREDOMINANTLY MONONUCLEAR NO ORGANISMS SEEN    Culture   Final    NO GROWTH 3 DAYS Performed at [facility], [address] [place], [place] [address]    Report Status 09/20/2023 FINAL  Final    Procedures and diagnostic studies:  No results found.               LOS: 16 days   Shirl Weir  [employer] loss adjuster on [email or web address]. If 7PM-7AM, please contact night-coverage at [email or web address]     09/20/2023, 5:28 PM

## 2023-09-20 NOTE — Progress Notes (Signed)
Physical Therapy Treatment Patient Details Name: Kaitlyn Hodges MRN: [id number] DOB: October 19, 1960 Today's Date: 09/20/2023   History of Present Illness Kaitlyn Hodges is a 63 y.o. female with medical history significant of prediabetes, CVA, seizure disorder, type I achalasia,tobacco use disorder, erosive esophagitis who presents to the ED due to altered mental status. Patient's daughter states that over the last 1 month, patient has been experiencing significant shortness of breath and cough that has progressively worsened.  Then for the last 1 week, she has been experiencing persistent nausea, vomiting and poor p.o. intake.    PT Comments  Pt seen for PT tx with pt received in bed, agreeable to tx. Pt does initiate bed mobility & is able to complete supine>sit with max assist, requires +2 for sit>supine. Pt tolerates sitting EOB ~4 minutes with CGA but pt appearing anxious with sitting EOB, ultimately requesting to be assisted back to bed. Will continue to follow pt acutely to progress mobility as able.    If plan is discharge home, recommend the following: Two people to help with walking and/or transfers;Two people to help with bathing/dressing/bathroom   Can travel by private vehicle     No  Equipment Recommendations  Other (comment) (defer to next venue)    Recommendations for Other Services       Precautions / Restrictions Precautions Precautions: Fall Precaution Comments: NG tube Restrictions Weight Bearing Restrictions Per Provider Order: No     Mobility  Bed Mobility Overal bed mobility: Needs Assistance Bed Mobility: Supine to Sit     Supine to sit: Max assist, HOB elevated, Used rails Sit to supine: Max assist, +2 for physical assistance, +2 for safety/equipment, HOB elevated, Used rails   General bed mobility comments: Pt is able to participate in moving BLE to EOB (moves LLE more than RLE), requires assistance to upright trunk but does attempt to initiate.    Transfers                         Ambulation/Gait                   Stairs             Wheelchair Mobility     Tilt Bed    Modified Rankin (Stroke Patients Only)       Balance Overall balance assessment: Needs assistance Sitting-balance support: Feet supported, Bilateral upper extremity supported Sitting balance-Leahy Scale: Poor Sitting balance - Comments: tolerated sitting EOB ~4 minutes                                    Cognition Arousal: Alert Behavior During Therapy: Flat affect, Anxious Overall Cognitive Status: Impaired/Different from baseline                         Following Commands: Follows one step commands inconsistently, Follows one step commands with increased time       General Comments: Pt attempting to communicate verbally <50% of the time, does attempt gestures during session. Follows simple commands with extra time. Appears anxious with mobility.        Exercises      General Comments        Pertinent Vitals/Pain Pain Assessment Pain Assessment: Faces Faces Pain Scale: No hurt    Home Living  Prior Function            PT Goals (current goals can now be found in the care plan section) Acute Rehab PT Goals Patient Stated Goal: to go to rehab PT Goal Formulation: With family Time For Goal Achievement: 10/01/23 Potential to Achieve Goals: Fair Progress towards PT goals: Progressing toward goals    Frequency           PT Plan      Co-evaluation              AM-PAC PT "6 Clicks" Mobility   Outcome Measure  Help needed turning from your back to your side while in a flat bed without using bedrails?: A Lot Help needed moving from lying on your back to sitting on the side of a flat bed without using bedrails?: Total Help needed moving to and from a bed to a chair (including a wheelchair)?: Total Help needed standing up from a chair using your arms  (e.g., wheelchair or bedside chair)?: Total Help needed to walk in hospital room?: Total Help needed climbing 3-5 steps with a railing? : Total 6 Click Score: 7    End of Session   Activity Tolerance: Patient limited by fatigue Patient left: in bed;with call bell/phone within reach;with bed alarm set (BLE prevalon boots donned) Nurse Communication: Mobility status PT Visit Diagnosis: Muscle weakness (generalized) (M62.81);Other abnormalities of gait and mobility (R26.89);Difficulty in walking, not elsewhere classified (R26.2)     Time: 1400-1411 PT Time Calculation (min) (ACUTE ONLY): 11 min  Charges:    $Therapeutic Activity: 8-22 mins PT General Charges $$ ACUTE PT VISIT: 1 Visit                     Aleda Grana, PT, DPT 09/20/23, 2:16 PM   Sandi Mariscal 09/20/2023, 2:15 PM

## 2023-09-20 NOTE — Plan of Care (Signed)
  Problem: Fluid Volume: Goal: Hemodynamic stability will improve Outcome: Not Progressing   Problem: Clinical Measurements: Goal: Diagnostic test results will improve Outcome: Not Progressing Goal: Signs and symptoms of infection will decrease Outcome: Not Progressing   Problem: Respiratory: Goal: Ability to maintain adequate ventilation will improve Outcome: Not Progressing   Problem: Education: Goal: Knowledge of General Education information will improve Description: Including pain rating scale, medication(s)/side effects and non-pharmacologic comfort measures Outcome: Not Progressing   Problem: Health Behavior/Discharge Planning: Goal: Ability to manage health-related needs will improve Outcome: Not Progressing   Problem: Clinical Measurements: Goal: Ability to maintain clinical measurements within normal limits will improve Outcome: Not Progressing Goal: Will remain free from infection Outcome: Not Progressing Goal: Diagnostic test results will improve Outcome: Not Progressing Goal: Respiratory complications will improve Outcome: Not Progressing Goal: Cardiovascular complication will be avoided Outcome: Not Progressing   Problem: Activity: Goal: Risk for activity intolerance will decrease Outcome: Not Progressing   Problem: Nutrition: Goal: Adequate nutrition will be maintained Outcome: Not Progressing   Problem: Coping: Goal: Level of anxiety will decrease Outcome: Not Progressing   Problem: Elimination: Goal: Will not experience complications related to bowel motility Outcome: Not Progressing Goal: Will not experience complications related to urinary retention Outcome: Not Progressing   Problem: Pain Management: Goal: General experience of comfort will improve Outcome: Not Progressing   Problem: Safety: Goal: Ability to remain free from injury will improve Outcome: Not Progressing   Problem: Skin Integrity: Goal: Risk for impaired skin integrity  will decrease Outcome: Not Progressing   Problem: Education: Goal: Ability to describe self-care measures that may prevent or decrease complications (Diabetes Survival Skills Education) will improve Outcome: Not Progressing Goal: Individualized Educational Video(s) Outcome: Not Progressing   Problem: Coping: Goal: Ability to adjust to condition or change in health will improve Outcome: Not Progressing   Problem: Fluid Volume: Goal: Ability to maintain a balanced intake and output will improve Outcome: Not Progressing   Problem: Health Behavior/Discharge Planning: Goal: Ability to identify and utilize available resources and services will improve Outcome: Not Progressing Goal: Ability to manage health-related needs will improve Outcome: Not Progressing   Problem: Metabolic: Goal: Ability to maintain appropriate glucose levels will improve Outcome: Not Progressing   Problem: Nutritional: Goal: Maintenance of adequate nutrition will improve Outcome: Not Progressing Goal: Progress toward achieving an optimal weight will improve Outcome: Not Progressing   Problem: Skin Integrity: Goal: Risk for impaired skin integrity will decrease Outcome: Not Progressing   Problem: Tissue Perfusion: Goal: Adequacy of tissue perfusion will improve Outcome: Not Progressing

## 2023-09-20 NOTE — Progress Notes (Signed)
Midge Minium, MD [facility]   [address] [place], [place] [address] Phone: [phone] Fax : [phone]   Subjective: The patient was evaluated by speech pathology who saw the patient not to be able to continue p.o. intake safely.  The patient has an NG tube for tube feedings at the present time.  The patient has been reconsulted for PEG tube placement.   Objective: Vital signs in last 24 hours: Vitals:   09/20/23 0021 09/20/23 0410 09/20/23 0415 09/20/23 0743  BP: 115/75  106/74 117/70  Pulse: 98  92 87  Resp: 20  20 16   Temp: 98 F (36.7 C)  (!) 97.4 F (36.3 C) 98.1 F (36.7 C)  TempSrc:      SpO2: 100%  100% 94%  Weight:  80.1 kg    Height:       Weight change: 2.9 kg  Intake/Output Summary (Last 24 hours) at 09/20/2023 1023 Last data filed at 09/20/2023 9528 Gross per 24 hour  Intake 2777.25 ml  Output 1800 ml  Net 977.25 ml     Exam: Heart:: Regular rate and rhythm or without murmur or extra heart sounds Lungs: normal and clear to auscultation and percussion Abdomen: soft, nontender, normal bowel sounds   Lab Results: @LABTEST2 @ Micro Results: Recent Results (from the past 240 hours)  Body fluid culture w Gram Stain     Status: None (Preliminary result)   Collection Time: 09/16/23  2:20 PM   Specimen: PATH Cytology Pleural fluid  Result Value Ref Range Status   Specimen Description   Final    FLUID Performed at [facility], [address], [place], [place] [address]    Special Requests   Final    CYTO PLEU Performed at [facility], [address] [place], [place] [address]    Gram Stain   Final    RARE WBC PRESENT, PREDOMINANTLY MONONUCLEAR NO ORGANISMS SEEN    Culture   Final    NO GROWTH 3 DAYS Performed at [facility], [address], [place], [place] [address]    Report Status PENDING  Incomplete   Studies/Results: No results found. Medications: I have reviewed the patient's current  medications. Scheduled Meds:  Chlorhexidine Gluconate Cloth  6 each Topical Daily   vitamin B-12  1,000 mcg Per Tube Daily   free water  200 mL Per Tube Q6H   insulin aspart  0-9 Units Subcutaneous Q6H   nutrition supplement (JUVEN)  1 packet Per Tube BID BM   mouth rinse  15 mL Mouth Rinse 4 times per day   pantoprazole (PROTONIX) IV  40 mg Intravenous Q12H   sodium chloride flush  3 mL Intravenous Q12H   Continuous Infusions:  feeding supplement (OSMOLITE 1.5 CAL) 1,000 mL (09/20/23 0054)   PRN Meds:.acetaminophen **OR** acetaminophen, liver oil-zinc oxide, ondansetron **OR** ondansetron (ZOFRAN) IV, mouth rinse   Assessment: Principal Problem:   Severe sepsis (HCC) Active Problems:   Seizure disorder (HCC)   Achalasia   Pre-diabetes   Acute renal failure (HCC)   Acute encephalopathy   History of CVA (cerebrovascular accident)   Closed wedge compression fracture of T3 vertebra (HCC)   Melena   Hemorrhagic shock (HCC)   Gastrointestinal hemorrhage   Lactic acidosis   Pressure injury of skin   Malnutrition of moderate degree   Upper GI bleed   Thrombocytopenia (HCC)   Hypophosphatemia   Acute hypoxic respiratory failure (HCC)   Duodenitis  Plan: I have spoken to the patient's daughter about the PEG tube placement.  I have spoken to the daughter about the PEG tube placement including the risks and benefits including bleeding perforation infection and death.  The daughter agrees to proceeding with the PEG tube placement and the patient will be set up for PEG tube placement for tomorrow.  LOS: 16 days   Midge Minium, MD.FACG 09/20/2023, 10:23 AM Pager [phone] 7am-5pm  Check AMION for 5pm -7am coverage and on weekends

## 2023-09-20 NOTE — Progress Notes (Addendum)
Patient received via hospital bed accompanied by transporter. Patient noted with NGT with tube feeding at 55 cc/hr. Rectal tube to BSD. Purewick to wall suction.   Kaitlyn Hodges [name]

## 2023-09-20 NOTE — Progress Notes (Signed)
Midge Minium, MD [facility]   [address] [place], [place] [address] Phone: [phone] Fax : [phone]   Subjective: This patient was seen by me previously for GI bleed.  The patient had been in the ICU and is no longer in the ICU but has a history of achalasia with her EGD showing severe esophagitis with a dilated esophagus and significant duodenitis.  I am now being called again to see the patient for PEG tube placement due to her failed swallowing study.   Objective: Vital signs in last 24 hours: Vitals:   09/20/23 0415 09/20/23 0743 09/20/23 1106 09/20/23 1545  BP: 106/74 117/70 116/72 122/73  Pulse: 92 87 87 90  Resp: 20 16 18 17   Temp: (!) 97.4 F (36.3 C) 98.1 F (36.7 C) 97.8 F (36.6 C) 98.2 F (36.8 C)  TempSrc:      SpO2: 100% 94% 98% 98%  Weight:      Height:       Weight change: 2.9 kg  Intake/Output Summary (Last 24 hours) at 09/20/2023 1803 Last data filed at 09/20/2023 1430 Gross per 24 hour  Intake 2577.25 ml  Output 2200 ml  Net 377.25 ml     Exam: Heart:: Regular rate and rhythm or without murmur or extra heart sounds Lungs: normal and clear to auscultation and percussion Abdomen: soft, nontender, normal bowel sounds   Lab Results: @LABTEST2 @ Micro Results: Recent Results (from the past 240 hours)  Body fluid culture w Gram Stain     Status: None   Collection Time: 09/16/23  2:20 PM   Specimen: PATH Cytology Pleural fluid  Result Value Ref Range Status   Specimen Description   Final    FLUID Performed at [facility], [address], [place], [place] [address]    Special Requests   Final    CYTO PLEU Performed at [facility], [address] [place], [place] [address]    Gram Stain   Final    RARE WBC PRESENT, PREDOMINANTLY MONONUCLEAR NO ORGANISMS SEEN    Culture   Final    NO GROWTH 3 DAYS Performed at [facility], [address], [place], [place] [address]    Report Status 09/20/2023  FINAL  Final   Studies/Results: No results found. Medications: I have reviewed the patient's current medications. Scheduled Meds:  Chlorhexidine Gluconate Cloth  6 each Topical Daily   vitamin B-12  1,000 mcg Per Tube Daily   free water  200 mL Per Tube Q6H   insulin aspart  0-9 Units Subcutaneous Q6H   nutrition supplement (JUVEN)  1 packet Per Tube BID BM   mouth rinse  15 mL Mouth Rinse 4 times per day   pantoprazole (PROTONIX) IV  40 mg Intravenous Q12H   sodium chloride flush  3 mL Intravenous Q12H   Continuous Infusions:  feeding supplement (OSMOLITE 1.5 CAL) 1,000 mL (09/20/23 0054)   PRN Meds:.acetaminophen **OR** acetaminophen, liver oil-zinc oxide, ondansetron **OR** ondansetron (ZOFRAN) IV, mouth rinse   Assessment: Principal Problem:   Severe sepsis (HCC) Active Problems:   Seizure disorder (HCC)   Achalasia   Pre-diabetes   Acute renal failure (HCC)   Acute encephalopathy   History of CVA (cerebrovascular accident)   Closed wedge compression fracture of T3 vertebra (HCC)   Melena   Hemorrhagic shock (HCC)   Gastrointestinal hemorrhage   Lactic acidosis   Pressure injury of skin   Malnutrition of moderate degree   Upper  GI bleed   Thrombocytopenia (HCC)   Hypophosphatemia   Acute hypoxic respiratory failure (HCC)   Duodenitis    Plan: The patient has had a failed swallowing study and is in need of a PEG tube.  I have discussed the risks benefits and alternatives with the patient's daughter.  The patient will have a PEG tube insertion set up for tomorrow.  The patient has been made n.p.o. after midnight.   LOS: 16 days   Midge Minium, MD.FACG 09/20/2023, 6:03 PM Pager [phone] 7am-5pm  Check AMION for 5pm -7am coverage and on weekends

## 2023-09-21 ENCOUNTER — Inpatient Hospital Stay: Payer: Medicaid Other | Admitting: Anesthesiology

## 2023-09-21 ENCOUNTER — Encounter: Payer: Self-pay | Admitting: Internal Medicine

## 2023-09-21 ENCOUNTER — Encounter
Admission: EM | Disposition: A | Discharge status: Skilled Nursing Facility | Payer: Self-pay | Source: Home / Self Care | Attending: Internal Medicine

## 2023-09-21 DIAGNOSIS — R6520 Severe sepsis without septic shock: Secondary | ICD-10-CM | POA: Diagnosis not present

## 2023-09-21 DIAGNOSIS — A419 Sepsis, unspecified organism: Secondary | ICD-10-CM | POA: Diagnosis not present

## 2023-09-21 DIAGNOSIS — K2289 Other specified disease of esophagus: Secondary | ICD-10-CM | POA: Diagnosis not present

## 2023-09-21 DIAGNOSIS — R1312 Dysphagia, oropharyngeal phase: Secondary | ICD-10-CM | POA: Diagnosis present

## 2023-09-21 DIAGNOSIS — K2980 Duodenitis without bleeding: Secondary | ICD-10-CM | POA: Diagnosis not present

## 2023-09-21 HISTORY — PX: PEG PLACEMENT: SHX5437

## 2023-09-21 LAB — GLUCOSE, CAPILLARY
Glucose-Capillary: 185 mg/dL — ABNORMAL HIGH (ref 70–99)
Glucose-Capillary: 77 mg/dL (ref 70–99)
Glucose-Capillary: 86 mg/dL (ref 70–99)

## 2023-09-21 LAB — BASIC METABOLIC PANEL
Anion gap: 4 — ABNORMAL LOW (ref 5–15)
BUN: 52 mg/dL — ABNORMAL HIGH (ref 8–23)
CO2: 22 mmol/L (ref 22–32)
Calcium: 7.8 mg/dL — ABNORMAL LOW (ref 8.9–10.3)
Chloride: 119 mmol/L — ABNORMAL HIGH (ref 98–111)
Creatinine, Ser: 0.75 mg/dL (ref 0.44–1.00)
GFR, Estimated: >60 mL/min (ref >60)
Glucose, Bld: 197 mg/dL — ABNORMAL HIGH (ref 70–99)
Potassium: 4.9 mmol/L (ref 3.5–5.1)
Sodium: 145 mmol/L (ref 135–145)

## 2023-09-21 LAB — CBC WITH DIFFERENTIAL/PLATELET
Abs Immature Granulocytes: 0.1 10*3/uL — ABNORMAL HIGH (ref 0.00–0.07)
Basophils Absolute: 0 10*3/uL (ref 0.0–0.1)
Basophils Relative: 1 %
Eosinophils Absolute: 0.2 10*3/uL (ref 0.0–0.5)
Eosinophils Relative: 3 %
HCT: 22.8 % — ABNORMAL LOW (ref 36.0–46.0)
Hemoglobin: 7.3 g/dL — ABNORMAL LOW (ref 12.0–15.0)
Immature Granulocytes: 2 %
Lymphocytes Relative: 24 %
Lymphs Abs: 1.5 10*3/uL (ref 0.7–4.0)
MCH: 31.2 pg (ref 26.0–34.0)
MCHC: 32 g/dL (ref 30.0–36.0)
MCV: 97.4 fL (ref 80.0–100.0)
Monocytes Absolute: 0.5 10*3/uL (ref 0.1–1.0)
Monocytes Relative: 8 %
Neutro Abs: 3.9 10*3/uL (ref 1.7–7.7)
Neutrophils Relative %: 62 %
Platelets: 208 10*3/uL (ref 150–400)
RBC: 2.34 MIL/uL — ABNORMAL LOW (ref 3.87–5.11)
RDW: 15.9 % — ABNORMAL HIGH (ref 11.5–15.5)
WBC: 6.3 10*3/uL (ref 4.0–10.5)
nRBC: 0 % (ref 0.0–0.2)

## 2023-09-21 SURGERY — INSERTION, PEG TUBE
Anesthesia: General

## 2023-09-21 MED ORDER — FENTANYL CITRATE (PF) 100 MCG/2ML IJ SOLN
INTRAMUSCULAR | Status: DC | PRN
  Administered 2023-09-21: 25 ug via INTRAVENOUS

## 2023-09-21 MED ORDER — CEFAZOLIN SODIUM-DEXTROSE 2-4 GM/100ML-% IV SOLN
2.0000 g | Freq: Once | INTRAVENOUS | Status: DC
  Filled 2023-09-21: qty 100

## 2023-09-21 MED ORDER — PHENYLEPHRINE 80 MCG/ML (10ML) SYRINGE FOR IV PUSH (FOR BLOOD PRESSURE SUPPORT)
PREFILLED_SYRINGE | INTRAVENOUS | Status: DC | PRN
  Administered 2023-09-21: 80 ug via INTRAVENOUS

## 2023-09-21 MED ORDER — SUCCINYLCHOLINE CHLORIDE 200 MG/10ML IV SOSY
PREFILLED_SYRINGE | INTRAVENOUS | Status: DC | PRN
  Administered 2023-09-21: 8 mg via INTRAVENOUS

## 2023-09-21 MED ORDER — FENTANYL CITRATE (PF) 100 MCG/2ML IJ SOLN
INTRAMUSCULAR | Status: AC
  Filled 2023-09-21: qty 2

## 2023-09-21 MED ORDER — CEFAZOLIN SODIUM-DEXTROSE 2-3 GM-%(50ML) IV SOLR
INTRAVENOUS | Status: DC | PRN
  Administered 2023-09-21: 2 g via INTRAVENOUS

## 2023-09-21 MED ORDER — SODIUM CHLORIDE 0.9 % IV SOLN: INTRAVENOUS | Status: DC

## 2023-09-21 MED ORDER — LIDOCAINE HCL (CARDIAC) PF 100 MG/5ML IV SOSY
PREFILLED_SYRINGE | INTRAVENOUS | Status: DC | PRN
  Administered 2023-09-21: 80 mg via INTRAVENOUS

## 2023-09-21 MED ORDER — PROPOFOL 10 MG/ML IV BOLUS
INTRAVENOUS | Status: DC | PRN
  Administered 2023-09-21: 150 mg via INTRAVENOUS

## 2023-09-21 MED ORDER — ONDANSETRON HCL 4 MG/2ML IJ SOLN
INTRAMUSCULAR | Status: DC | PRN
  Administered 2023-09-21: 4 mg via INTRAVENOUS

## 2023-09-21 MED ORDER — OSMOLITE 1.5 CAL PO LIQD
1000.0000 mL | ORAL | Status: DC
  Administered 2023-09-21 – 2023-09-29 (×9): 1000 mL

## 2023-09-21 NOTE — Op Note (Signed)
[facility] Gastroenterology Patient Name: Kaitlyn Hodges Procedure Date: 09/21/2023 3:20 PM MRN: [id number] Account #: [id number] Date of Birth: [birth year] Admit Type: Inpatient Age: 63 Room: [facility] ENDO ROOM 4 Gender: Female Note Status: Finalized Instrument Name: Patton Salles Endoscope [id number] Procedure:             Upper GI endoscopy Indications:           Dysphagia Providers:             Midge Minium MD, MD Medicines:             General Anesthesia Complications:         No immediate complications. Procedure:             Pre-Anesthesia Assessment:                        - Prior to the procedure, a History and Physical was                         performed, and patient medications and allergies were                         reviewed. The patient's tolerance of previous                         anesthesia was also reviewed. The risks and benefits                         of the procedure and the sedation options and risks                         were discussed with the patient. All questions were                         answered, and informed consent was obtained. Prior                         Anticoagulants: The patient has taken no anticoagulant                         or antiplatelet agents. ASA Grade Assessment: III - A                         patient with severe systemic disease. After reviewing                         the risks and benefits, the patient was deemed in                         satisfactory condition to undergo the procedure.                        After obtaining informed consent, the endoscope was                         passed under direct vision. Throughout the procedure,                         the patient's blood pressure, pulse,  and oxygen                         saturations were monitored continuously. The Endoscope                         was introduced through the mouth, and advanced to the                         second part of duodenum. The upper  GI endoscopy was                         accomplished without difficulty. The patient tolerated                         the procedure well. Findings:      The lumen of the esophagus was severely dilated.      The stomach was normal. Placement of an externally removable PEG with no       T-fasteners was successfully completed. The external bumper was at the       2.0 cm marking on the tube.      Inflammation was found in the entire duodenum. Impression:            - Dilation in the entire esophagus.                        - Normal stomach.                        - Duodenitis.                        - An externally removable PEG placement was                         successfully completed.                        - No specimens collected. Recommendation:        - Return patient to hospital ward for ongoing care.                        - Please follow the post-PEG recommendations                         including: external bolster 1 cm from abdominal wall,                         change dressing once per day and NPO x4 hrs then water                         today. Procedure Code(s):     --- Professional ---                        [phone], Esophagogastroduodenoscopy, flexible,                         transoral; with directed placement of percutaneous  gastrostomy tube Diagnosis Code(s):     --- Professional ---                        R13.10, Dysphagia, unspecified CPT copyright 2022 American Medical Association. All rights reserved. The codes documented in this report are preliminary and upon coder review may  be revised to meet current compliance requirements. Midge Minium MD, MD 09/21/2023 3:46:51 PM This report has been signed electronically. Number of Addenda: 0 Note Initiated On: 09/21/2023 3:20 PM Estimated Blood Loss:  Estimated blood loss: none.      [facility]

## 2023-09-21 NOTE — Anesthesia Postprocedure Evaluation (Signed)
Anesthesia Post Note  Patient: Kaitlyn Hodges  Procedure(s) Performed: PERCUTANEOUS ENDOSCOPIC GASTROSTOMY (PEG) PLACEMENT  Patient location during evaluation: PACU Anesthesia Type: General Level of consciousness: patient cooperative and confused Pain management: pain level controlled Vital Signs Assessment: post-procedure vital signs reviewed and stable Respiratory status: spontaneous breathing, nonlabored ventilation and respiratory function stable Cardiovascular status: blood pressure returned to baseline and stable Postop Assessment: adequate PO intake Anesthetic complications: no   No notable events documented.   Last Vitals:  Vitals:   09/21/23 1606 09/21/23 1618  BP: 113/71 118/74  Pulse: 86 84  Resp: 19 17  Temp:    SpO2: 100% 100%    Last Pain:  Vitals:   09/21/23 1618  TempSrc:   PainSc: 0-No pain                 Reed Breech

## 2023-09-21 NOTE — Transfer of Care (Signed)
Immediate Anesthesia Transfer of Care Note  Patient: Kaitlyn Hodges  Procedure(s) Performed: PERCUTANEOUS ENDOSCOPIC GASTROSTOMY (PEG) PLACEMENT  Patient Location: Endoscopy Unit  Anesthesia Type:General  Level of Consciousness: awake and alert   Airway & Oxygen Therapy: Patient Spontanous Breathing and Patient connected to face mask oxygen  Post-op Assessment: Report given to RN and Post -op Vital signs reviewed and stable  Post vital signs: Reviewed and stable  Last Vitals:  Vitals Value Taken Time  BP    Temp    Pulse    Resp 19 09/21/23 1556  SpO2    Vitals shown include unfiled device data.  Last Pain:  Vitals:   09/21/23 1501  TempSrc: Temporal  PainSc:       Patients Stated Pain Goal: 0 (09/15/23 0400)  Complications: No notable events documented.

## 2023-09-21 NOTE — Progress Notes (Signed)
PT Cancellation Note  Patient Details Name: Philippine Newvine MRN: [id number] DOB: 10-09-1960   Cancelled Treatment:    Reason Eval/Treat Not Completed: Patient at procedure or test/unavailable. PT to follow up as appropriate.   Donna Bernard, PT, MPT  Ina Homes 09/21/2023, 3:14 PM

## 2023-09-21 NOTE — Anesthesia Procedure Notes (Signed)
Procedure Name: Intubation Date/Time: 09/21/2023 3:30 PM  Performed by: Irving Burton, CRNAPre-anesthesia Checklist: Patient identified, Patient being monitored, Timeout performed, Emergency Drugs available and Suction available Patient Re-evaluated:Patient Re-evaluated prior to induction Oxygen Delivery Method: Circle system utilized Preoxygenation: Pre-oxygenation with 100% oxygen Induction Type: IV induction and Rapid sequence Ventilation: Mask ventilation without difficulty Laryngoscope Size: 3 and McGrath Grade View: Grade I Tube type: Oral Tube size: 7.0 mm Number of attempts: 1 Airway Equipment and Method: Stylet and Video-laryngoscopy Placement Confirmation: ETT inserted through vocal cords under direct vision, positive ETCO2 and breath sounds checked- equal and bilateral Secured at: 21 cm Tube secured with: Tape Dental Injury: Teeth and Oropharynx as per pre-operative assessment

## 2023-09-21 NOTE — Progress Notes (Addendum)
Nutrition Follow-up  DOCUMENTATION CODES:   Non-severe (moderate) malnutrition in context of chronic illness  INTERVENTION:   -TF via PEG:   Initiate Osmolite 1.5 @ 25 ml/hr and increase by 10 ml every 4 hours to goal rate of 55 ml/hr.   200 ml free water flush every 4 hours  Tube feeding regimen provides 1980 kcal (100% of needs), 83 grams of protein, and 1006 ml of H2O. Total free water: 2206 ml daily  -Continue Cyanocobalamin 1000 mcg daily via tube   -Continue -1 packet Juven BID, each packet provides 95 calories, 2.5 grams of protein (collagen), and 9.8 grams of carbohydrate (3 grams sugar); also contains 7 grams of L-arginine and L-glutamine, 300 mg vitamin C, 15 mg vitamin E, 1.2 mcg vitamin B-12, 9.5 mg zinc, 200 mg calcium, and 1.5 g  Calcium Beta-hydroxy-Beta-methylbutyrate to support wound healing   NUTRITION DIAGNOSIS:   Moderate Malnutrition related to chronic illness as evidenced by mild fat depletion, moderate fat depletion, mild muscle depletion, moderate muscle depletion.  Ongoing  GOAL:   Patient will meet greater than or equal to 90% of their needs  Progressing   MONITOR:   Diet advancement, Labs, Weight trends, TF tolerance, I & O's, Skin  REASON FOR ASSESSMENT:   Consult Enteral/tube feeding initiation and management  ASSESSMENT:   63 y/o female with h/o seizures, dysphagia secondary to achalasia with esophageal dilation and numerous food impactions, CVA (2009), depression, DM and compression fracture who is admitted with AMS, AKI, GIB and sepsis.  1/8- s/p EGD- pt noted to have tortuous esophagus, esophagitis with dilation & erythematous duodenopathy  1/15- s/p EGD- pt noted to have tortuous esophagus, esophagitis with dilation, duodenitis & oozing duodenal ulcer  1/16- s/p rt thoracentesis (600 ml removed) 1/17- s/p BSE- continue feeding via alternative means 1/21- s/p PEG placement   Reviewed I/O's: -1.8 L x 24 hours and +2.6 L since  09/07/23  UOP: 1.8 L x 24 hours   Pt down in endo suite at time of visit.   Per MD notes, daughter reports pt has had achalasia for over 15 years; she was told she need surgery about 10 years ago, when pt weighed around 400#. Pt was re-evaluated for surgery about 5 years ago, however, pt has been putting it off.   Pt remains NPO. Case discussed with RN, MD, and GI; PEG placed today. Received consult to resume. Per GI, ok to resume feeds 4 hours after placement as long as bowel sounds are present.   No wt loss noted since admission.   Per TOC notes, plan for SNF placement at discharge.   Medications reviewed and include vitamin B-12 and 0.9% sodium chloride infusion @ 20 ml/hr.   Labs reviewed: K, Mg, and Phos daily, CBGS: 77-235 (inpatient orders for glycemic control are 0-9 units insulin aspart every 6 hours).    Diet Order:   Diet Order             Diet NPO time specified Except for: Sips with Meds  Diet effective midnight           Diet NPO time specified  Diet effective midnight                   EDUCATION NEEDS:   No education needs have been identified at this time  Skin:  Skin Assessment: Skin Integrity Issues: Skin Integrity Issues:: Stage II Stage II: lt buttocks, medial buttocks  Last BM:  09/20/23  Height:   Ht  Readings from Last 1 Encounters:  09/04/23 5\' 6"  (1.676 m)    Weight:   Wt Readings from Last 1 Encounters:  09/21/23 78.1 kg    Ideal Body Weight:  59 kg  BMI:  Body mass index is 27.79 kg/m.  Estimated Nutritional Needs:   Kcal:  1700-1900kcal/day  Protein:  85-95g/day  Fluid:  1.8-2.1L/day    Levada Schilling, RD, LDN, CDCES Registered Dietitian III Certified Diabetes Care and Education Specialist If unable to reach this RD, please use "RD Inpatient" group chat on secure chat between hours of 8am-4 pm daily

## 2023-09-21 NOTE — Anesthesia Preprocedure Evaluation (Addendum)
Anesthesia Evaluation  Patient identified by MRN, date of birth, ID band Patient confused    Reviewed: Allergy & Precautions, NPO status , Patient's Chart, lab work & pertinent test results  History of Anesthesia Complications Negative for: history of anesthetic complications  Airway Mallampati: IV   Neck ROM: Full    Dental  (+) Poor Dentition, Missing, Chipped   Pulmonary pneumonia (on admission, now improved), Current Smoker and Patient abstained from smoking. Bilateral pleural effusions s/p thoracentesis 09/16/23   Pulmonary exam normal breath sounds clear to auscultation       Cardiovascular Normal cardiovascular exam Rhythm:Regular Rate:Normal  ECG 09/11/23:  Normal sinus rhythm with short PR Nonspecific T wave abnormality   Neuro/Psych Seizures -,  PSYCHIATRIC DISORDERS  Depression    CVA    GI/Hepatic PUD,,,Achalasia    Endo/Other  Prediabetes   Renal/GU Renal disease (nephrolithiasis)     Musculoskeletal   Abdominal   Peds  Hematology  (+) Blood dyscrasia, anemia   Anesthesia Other Findings   Reproductive/Obstetrics                             Anesthesia Physical Anesthesia Plan  ASA: 3  Anesthesia Plan: General   Post-op Pain Management:    Induction: Intravenous and Rapid sequence  PONV Risk Score and Plan: 2 and Ondansetron, Dexamethasone and Treatment may vary due to age or medical condition  Airway Management Planned: Oral ETT  Additional Equipment:   Intra-op Plan:   Post-operative Plan: Extubation in OR  Informed Consent: I have reviewed the patients History and Physical, chart, labs and discussed the procedure including the risks, benefits and alternatives for the proposed anesthesia with the patient or authorized representative who has indicated his/her understanding and acceptance.     Dental advisory given and Consent reviewed with POA  Plan Discussed  with: CRNA  Anesthesia Plan Comments: (Patient's son Iantha Fallen consented via phone for risks of anesthesia including but not limited to:  - adverse reactions to medications - damage to eyes, teeth, lips or other oral mucosa - nerve damage due to positioning  - sore throat or hoarseness - damage to heart, brain, nerves, lungs, other parts of body or loss of life  Informed patient's son about role of CRNA in peri- and intra-operative care; he voiced understanding.)        Anesthesia Quick Evaluation

## 2023-09-21 NOTE — Plan of Care (Signed)
  Problem: Fluid Volume: Goal: Hemodynamic stability will improve Outcome: Progressing   Problem: Clinical Measurements: Goal: Diagnostic test results will improve Outcome: Progressing Goal: Signs and symptoms of infection will decrease Outcome: Progressing   Problem: Respiratory: Goal: Ability to maintain adequate ventilation will improve Outcome: Progressing   Problem: Education: Goal: Knowledge of General Education information will improve Description: Including pain rating scale, medication(s)/side effects and non-pharmacologic comfort measures Outcome: Progressing   Problem: Health Behavior/Discharge Planning: Goal: Ability to manage health-related needs will improve Outcome: Progressing   Problem: Clinical Measurements: Goal: Ability to maintain clinical measurements within normal limits will improve Outcome: Progressing Goal: Will remain free from infection Outcome: Progressing Goal: Diagnostic test results will improve Outcome: Progressing Goal: Respiratory complications will improve Outcome: Progressing Goal: Cardiovascular complication will be avoided Outcome: Progressing   Problem: Activity: Goal: Risk for activity intolerance will decrease Outcome: Progressing   Problem: Nutrition: Goal: Adequate nutrition will be maintained Outcome: Progressing   Problem: Coping: Goal: Level of anxiety will decrease Outcome: Progressing   Problem: Elimination: Goal: Will not experience complications related to bowel motility Outcome: Progressing Goal: Will not experience complications related to urinary retention Outcome: Progressing   Problem: Pain Management: Goal: General experience of comfort will improve Outcome: Progressing   Problem: Safety: Goal: Ability to remain free from injury will improve Outcome: Progressing   Problem: Skin Integrity: Goal: Risk for impaired skin integrity will decrease Outcome: Progressing   Problem: Education: Goal:  Ability to describe self-care measures that may prevent or decrease complications (Diabetes Survival Skills Education) will improve Outcome: Progressing Goal: Individualized Educational Video(s) Outcome: Progressing   Problem: Coping: Goal: Ability to adjust to condition or change in health will improve Outcome: Progressing   Problem: Fluid Volume: Goal: Ability to maintain a balanced intake and output will improve Outcome: Progressing   Problem: Health Behavior/Discharge Planning: Goal: Ability to identify and utilize available resources and services will improve Outcome: Progressing Goal: Ability to manage health-related needs will improve Outcome: Progressing   Problem: Metabolic: Goal: Ability to maintain appropriate glucose levels will improve Outcome: Progressing   Problem: Nutritional: Goal: Maintenance of adequate nutrition will improve Outcome: Progressing Goal: Progress toward achieving an optimal weight will improve Outcome: Progressing   Problem: Skin Integrity: Goal: Risk for impaired skin integrity will decrease Outcome: Progressing   Problem: Tissue Perfusion: Goal: Adequacy of tissue perfusion will improve Outcome: Progressing

## 2023-09-21 NOTE — Progress Notes (Signed)
Progress Note    Kaitlyn Hodges  WGN:[id number] DOB: Dec 05, 1960  DOA: 09/04/2023 PCP: Malva Limes, MD      Brief Narrative:    Medical records reviewed and are as summarized below:  Kaitlyn Hodges is a 63 y.o. female with medical history significant of prediabetes, CVA, seizure disorder, type I achalasia,tobacco use disorder, erosive esophagitis, who presented to the hospital with altered mental status.  Her daughter reported cough and shortness of breath for about a month prior to admission.  She also reported nausea, vomiting and poor oral intake for about a week prior to admission Patient was afebrile, hypotensive and tachycardic on vitals. She had a mild acidosis with increased AG. Noted lactate of 7.7. WBC of 32.9. BUN/cr 114/4.61. COVID, flu, RSV negative. Pan scan notable for possible L parotiditis, central airway thickening and patchy airspace opacities in the bases, age indeterminate T3 fracture.  Patient was started on broad spectrum antibiotics and fluid resuscitated.       Assessment/Plan:   Principal Problem:   Severe sepsis (HCC) Active Problems:   Acute renal failure (HCC)   Acute encephalopathy   Achalasia   Pre-diabetes   Seizure disorder (HCC)   History of CVA (cerebrovascular accident)   Closed wedge compression fracture of T3 vertebra (HCC)   Melena   Hemorrhagic shock (HCC)   Gastrointestinal hemorrhage   Lactic acidosis   Pressure injury of skin   Malnutrition of moderate degree   Upper GI bleed   Thrombocytopenia (HCC)   Hypophosphatemia   Acute hypoxic respiratory failure (HCC)   Duodenitis   Nutrition Problem: Moderate Malnutrition Etiology: chronic illness  Signs/Symptoms: mild fat depletion, moderate fat depletion, mild muscle depletion, moderate muscle depletion   Body mass index is 27.79 kg/m.   Severe sepsis. Community-acquired pneumonia. Acute hypoxic respiratory failure. Improved.  She is tolerating room air. Respiratory  panel-negative. MRSA PCR negative. Off Bipap and Levophed, PCCM signed off. HFNC 40% tapered to 8L nasal cannula Initially treated with IV Zosyn but this was changed to IV cefepime because of suspicion of drug-induced ITP.  Cefepime finished 09/13/23. Urine cultures insignificant growth.  Blood cultures negative   B/l pleural effusions with compressive atelectasis. CTA chest done on 09/13/2023- negative for PE, new moderate to large bilateral pleural effusions with associated compressive atelectasis. S/p right-sided thoracentesis with removal of 600 mL of pleural fluid on 09/16/2023.  Repeat chest x-ray shows improvement in pleural effusions.    Hemorrhagic shock secondary to acute GI bleed, gastritis, duodenitis, oozing duodenal ulcer with visible vessel Acute blood loss anemia. S/p EGD on 09/08/2023, showed tortuous esophagus, diffuse inflammation in the esophagus and the small bowel which is not amenable to any endoscopic procedure.  Repeat EGD on 09/15/2023 showed tortuous esophagus, dilation in the entire esophagus, gastritis, duodenitis, oozing duodenal ulcer with a visible vessel.  Clips were placed.  She treated with argon plasma coagulation. Continue IV Protonix Patient received blood transfusion on 09/07/23.  S/p transfusion with 1 unit of PRBCs on 09/15/2023 H&H is stable. No antiplatelets or anticoagulants for now   Thrombocytopenia: Resolved.  Hematology consult appreciated. 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 Suspicion for TTP, DIC is low. 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 S/p transfusion with 2 units of platelets on 09/14/2023, 2 units of platelets on 07/12/2024 and 1 unit of platelet on 09/07/2023.      AKI (acute kidney injury) (HCC) Improved 2/2 hypovolemia and severe sepsis. In the setting of poor p.o. intake,  severe uncontrolled achalasia.  Enteral nutrition and free water as able Stopped IV fluids 09/11/23. Avoid nephrotoxic drugs.     Hypokalemia Hypophosphatemia- Improved.  Replete as needed per protocol.    Hypernatremia Improved    Acute metabolic encephalopathy Overall, mental status is better but she is still confused.  Not sure whether this will be a new baseline.  MRI brain done 1/9 showed dural thickening versus thin subdural collection overlying the left cerebral hemisphere, measuring up to 3-4 mm in thickness. Neurosurgery reviewed imaging.  No indication for surgery. EEG showed moderate diffuse encephalopathy. No seizures or epileptiform discharges. Continue supportive care.    Pre-diabetes Sliding scale as needed.   Hypoglycemia protocol.    Closed wedge compression fracture of T3 vertebra (HCC) Incidental finding of a T3 compression fracture on imaging.  S/p fall prior to admission Analgesics as needed for pain    History of CVA (cerebrovascular accident) CT of the head obtained showed chronic infarcts scattered along the left frontoparietal convexity in the left basal ganglia, as well as lacunar infarcts in the right thalamus. No acute findings.     Seizure disorder (HCC) History of seizure disorder since CVA in 2010.  No reported seizures since that time. EEG showed moderate diffuse encephalopathy. No seizures or epileptiform discharges. Continue seizure precautions.   Dysphagia, achalasia, nutrition She was reevaluated by speech therapist.  Unfortunately, patient can't have a safe diet at this point.  S/p PEG tube placement on 09/21/2023. NG tube will be discontinued.   [name], daughter, said patient has had achalasia for over 15 years.  She was told she needed surgery about 10 years ago but she was about 400 pounds at that time.  She was reevaluated about 5 years ago and was told she needed surgery.  She had been putting it off since then.    General Weakness PT and OT recommended discharge to SNF       Diet Order             Diet NPO time specified  Diet effective  midnight                            Consultants: Hematologist Intensivist Gastroenterologist  Procedures: EGD on 09/08/2023 EGD on 09/15/2023    Medications:    Chlorhexidine Gluconate Cloth  6 each Topical Daily   vitamin B-12  1,000 mcg Per Tube Daily   free water  200 mL Per Tube Q6H   insulin aspart  0-9 Units Subcutaneous Q6H   nutrition supplement (JUVEN)  1 packet Per Tube BID BM   mouth rinse  15 mL Mouth Rinse 4 times per day   pantoprazole (PROTONIX) IV  40 mg Intravenous Q12H   sodium chloride flush  3 mL Intravenous Q12H   Continuous Infusions:   ceFAZolin (ANCEF) IV     feeding supplement (OSMOLITE 1.5 CAL) 1,000 mL (09/20/23 2230)     Anti-infectives (From admission, onward)    Start     Dose/Rate Route Frequency Ordered Stop   09/21/23 0830  ceFAZolin (ANCEF) IVPB 2g/100 mL premix        2 g 200 mL/hr over 30 Minutes Intravenous  Once 09/21/23 0737     09/12/23 1345  ceFEPIme (MAXIPIME) 2 g in sodium chloride 0.9 % 100 mL IVPB       Note to Pharmacy: Pharmacy to dose, heme/ onc advised to dc zosyn in the setting of dropping platelets  2 g 200 mL/hr over 30 Minutes Intravenous Every 12 hours 09/12/23 1255 09/14/23 0118   09/11/23 1315  ceFEPIme (MAXIPIME) 1 g in sodium chloride 0.9 % 100 mL IVPB  Status:  Discontinued       Note to Pharmacy: Pharmacy to dose, heme/ onc advised to dc zosyn in the setting of dropping platelets   1 g 200 mL/hr over 30 Minutes Intravenous Every 12 hours 09/11/23 1222 09/12/23 1255   09/08/23 1800  piperacillin-tazobactam (ZOSYN) IVPB 3.375 g  Status:  Discontinued        3.375 g 12.5 mL/hr over 240 Minutes Intravenous Every 8 hours 09/08/23 1629 09/11/23 1222   09/08/23 1800  vancomycin (VANCOREADY) IVPB 1500 mg/300 mL  Status:  Discontinued        1,500 mg 150 mL/hr over 120 Minutes Intravenous Every 48 hours 09/08/23 1629 09/09/23 0829   09/08/23 1702  vancomycin variable dose per unstable renal function  (pharmacist dosing)  Status:  Discontinued         Does not apply See admin instructions 09/08/23 1702 09/09/23 0829   09/08/23 1000  cefTRIAXone (ROCEPHIN) 2 g in sodium chloride 0.9 % 100 mL IVPB  Status:  Discontinued        2 g 200 mL/hr over 30 Minutes Intravenous Every 24 hours 09/08/23 0852 09/08/23 1616   09/07/23 1000  doxycycline (VIBRAMYCIN) 100 mg in dextrose 5 % 250 mL IVPB  Status:  Discontinued        100 mg 125 mL/hr over 120 Minutes Intravenous Every 12 hours 09/07/23 0836 09/08/23 1616   09/07/23 0800  ceFEPIme (MAXIPIME) 2 g in sodium chloride 0.9 % 100 mL IVPB  Status:  Discontinued        2 g 200 mL/hr over 30 Minutes Intravenous Every 12 hours 09/07/23 0534 09/07/23 0538   09/07/23 0800  ceFEPIme (MAXIPIME) 2 g in sodium chloride 0.9 % 100 mL IVPB  Status:  Discontinued        2 g 200 mL/hr over 30 Minutes Intravenous Every 12 hours 09/07/23 0538 09/08/23 0852   09/06/23 1430  doxycycline (VIBRA-TABS) tablet 100 mg  Status:  Discontinued        100 mg Oral Every 12 hours 09/06/23 1418 09/07/23 0836   09/05/23 1200  ceFEPIme (MAXIPIME) 2 g in sodium chloride 0.9 % 100 mL IVPB  Status:  Discontinued        2 g 200 mL/hr over 30 Minutes Intravenous Every 24 hours 09/04/23 1450 09/05/23 0956   09/05/23 1000  cefTRIAXone (ROCEPHIN) 2 g in sodium chloride 0.9 % 100 mL IVPB  Status:  Discontinued        2 g 200 mL/hr over 30 Minutes Intravenous Every 24 hours 09/05/23 0956 09/07/23 0527   09/04/23 2200  metroNIDAZOLE (FLAGYL) IVPB 500 mg  Status:  Discontinued        500 mg 100 mL/hr over 60 Minutes Intravenous Every 12 hours 09/04/23 1416 09/08/23 1616   09/04/23 1600  vancomycin (VANCOREADY) IVPB 750 mg/150 mL        750 mg 150 mL/hr over 60 Minutes Intravenous  Once 09/04/23 1450 09/04/23 1611   09/04/23 1450  vancomycin variable dose per unstable renal function (pharmacist dosing)  Status:  Discontinued         Does not apply See admin instructions 09/04/23 1450  09/06/23 1418   09/04/23 1130  ceFEPIme (MAXIPIME) 2 g in sodium chloride 0.9 % 100 mL IVPB  2 g 200 mL/hr over 30 Minutes Intravenous  Once 09/04/23 1123 09/04/23 1233   09/04/23 1130  metroNIDAZOLE (FLAGYL) IVPB 500 mg        500 mg 100 mL/hr over 60 Minutes Intravenous  Once 09/04/23 1123 09/04/23 1318   09/04/23 1130  vancomycin (VANCOCIN) IVPB 1000 mg/200 mL premix        1,000 mg 200 mL/hr over 60 Minutes Intravenous  Once 09/04/23 1123 09/04/23 1349              Family Communication/Anticipated D/C date and plan/Code Status   DVT prophylaxis: Place and maintain sequential compression device Start: 09/20/23 1732     Code Status: Full Code  Family Communication: None Disposition Plan: Likely discharge to SNF   Status is: Inpatient Remains inpatient appropriate because: Awaiting placement      Subjective:   Interval events noted.  She has no complaints.  Objective:    Vitals:   09/20/23 2047 09/21/23 0500 09/21/23 0546 09/21/23 0757  BP: 125/82  123/81 95/67  Pulse: 96  88 86  Resp: 20  16 15   Temp: 98.6 F (37 C)  98.3 F (36.8 C) 97.9 F (36.6 C)  TempSrc: Oral  Oral   SpO2: 100%  98% 100%  Weight:  78.1 kg    Height:       No data found.   Intake/Output Summary (Last 24 hours) at 09/21/2023 1115 Last data filed at 09/21/2023 0553 Gross per 24 hour  Intake --  Output 1800 ml  Net -1800 ml   Filed Weights   09/19/23 0500 09/20/23 0410 09/21/23 0500  Weight: 80.3 kg 80.1 kg 78.1 kg    Exam:  GEN: NAD SKIN: Warm and dry EYES: Pale but anicteric ENT: MMM CV: RRR PULM: CTA B ABD: soft, ND, NT, +BS CNS: She was more alert today. EXT: Edema of upper and lower extremities has improved   Data Reviewed:   I have personally reviewed following labs and imaging studies:  Labs: Labs show the following:   Basic Metabolic Panel: Recent Labs  Lab 09/15/23 0418 09/16/23 0437 09/17/23 0417 09/18/23 0525 09/19/23 0527  09/21/23 0426  NA 148* 146* 151* 152* 148* 145  K 3.5 4.2 4.0 4.2 4.7 4.9  CL 112* 110 115* 119* 118* 119*  CO2 29 26 29 27 25 22   GLUCOSE 81 268* 207* 195* 225* 197*  BUN 71* 67* 56* 58* 57* 52*  CREATININE 1.07* 1.14* 1.07* 0.90 0.79 0.75  CALCIUM 7.6* 7.5* 7.7* 8.1* 7.9* 7.8*  MG  --  1.8  --   --   --   --   PHOS 3.9 3.7  --   --   --   --    GFR Estimated Creatinine Clearance: 75.9 mL/min (by C-G formula based on SCr of 0.75 mg/dL). Liver Function Tests: Recent Labs  Lab 09/16/23 0437  AST 24  ALT 21  ALKPHOS 149*  BILITOT 0.4  PROT 4.6*  ALBUMIN 1.6*   No results for input(s): "LIPASE", "AMYLASE" in the last 168 hours. No results for input(s): "AMMONIA" in the last 168 hours. Coagulation profile No results for input(s): "INR", "PROTIME" in the last 168 hours.   CBC: Recent Labs  Lab 09/15/23 0418 09/16/23 0437 09/17/23 0417 09/18/23 0525 09/21/23 0426  WBC 6.9 7.8 6.5 6.4 6.3  NEUTROABS  --   --   --   --  3.9  HGB 7.1* 8.3* 8.1* 8.1* 7.3*  HCT 22.0* 25.2* 25.1*  24.8* 22.8*  MCV 96.1 93.3 97.3 94.3 97.4  PLT 116* 119* 131* 155 208   Cardiac Enzymes: No results for input(s): "CKTOTAL", "CKMB", "CKMBINDEX", "TROPONINI" in the last 168 hours. BNP (last 3 results) No results for input(s): "PROBNP" in the last 8760 hours. CBG: Recent Labs  Lab 09/20/23 0734 09/20/23 1101 09/20/23 1540 09/20/23 2225 09/21/23 0543  GLUCAP 177* 178* 235* 131* 185*   D-Dimer: No results for input(s): "DDIMER" in the last 72 hours.  Hgb A1c: No results for input(s): "HGBA1C" in the last 72 hours. Lipid Profile: No results for input(s): "CHOL", "HDL", "LDLCALC", "TRIG", "CHOLHDL", "LDLDIRECT" in the last 72 hours. Thyroid function studies: No results for input(s): "TSH", "T4TOTAL", "T3FREE", "THYROIDAB" in the last 72 hours.  Invalid input(s): "FREET3" Anemia work up: No results for input(s): "VITAMINB12", "FOLATE", "FERRITIN", "TIBC", "IRON", "RETICCTPCT" in the  last 72 hours.  Sepsis Labs: Recent Labs  Lab 09/16/23 0437 09/17/23 0417 09/18/23 0525 09/21/23 0426  WBC 7.8 6.5 6.4 6.3    Microbiology Recent Results (from the past 240 hours)  Body fluid culture w Gram Stain     Status: None   Collection Time: 09/16/23  2:20 PM   Specimen: PATH Cytology Pleural fluid  Result Value Ref Range Status   Specimen Description   Final    FLUID Performed at [facility], [address] [place], [place] [address]    Special Requests   Final    CYTO PLEU Performed at [facility], [address] [place], [place] [address]    Gram Stain   Final    RARE WBC PRESENT, PREDOMINANTLY MONONUCLEAR NO ORGANISMS SEEN    Culture   Final    NO GROWTH 3 DAYS Performed at [facility], [address] [place], [place] [address]    Report Status 09/20/2023 FINAL  Final    Procedures and diagnostic studies:  No results found.               LOS: 17 days   Prakriti Carignan  [facility] [employer] loss adjuster on [email or web address]. If 7PM-7AM, please contact night-coverage at [email or web address]     09/21/2023, 11:15 AM

## 2023-09-22 ENCOUNTER — Encounter: Payer: Self-pay | Admitting: Gastroenterology

## 2023-09-22 DIAGNOSIS — E872 Acidosis, unspecified: Secondary | ICD-10-CM | POA: Diagnosis not present

## 2023-09-22 DIAGNOSIS — R1311 Dysphagia, oral phase: Secondary | ICD-10-CM | POA: Diagnosis not present

## 2023-09-22 DIAGNOSIS — N179 Acute kidney failure, unspecified: Secondary | ICD-10-CM | POA: Diagnosis not present

## 2023-09-22 DIAGNOSIS — A419 Sepsis, unspecified organism: Secondary | ICD-10-CM | POA: Diagnosis not present

## 2023-09-22 DIAGNOSIS — E538 Deficiency of other specified B group vitamins: Secondary | ICD-10-CM | POA: Diagnosis not present

## 2023-09-22 LAB — BASIC METABOLIC PANEL
Anion gap: 7 (ref 5–15)
BUN: 38 mg/dL — ABNORMAL HIGH (ref 8–23)
CO2: 22 mmol/L (ref 22–32)
Calcium: 8 mg/dL — ABNORMAL LOW (ref 8.9–10.3)
Chloride: 116 mmol/L — ABNORMAL HIGH (ref 98–111)
Creatinine, Ser: 0.79 mg/dL (ref 0.44–1.00)
GFR, Estimated: >60 mL/min (ref >60)
Glucose, Bld: 133 mg/dL — ABNORMAL HIGH (ref 70–99)
Potassium: 4.8 mmol/L (ref 3.5–5.1)
Sodium: 145 mmol/L (ref 135–145)

## 2023-09-22 LAB — GLUCOSE, CAPILLARY
Glucose-Capillary: 102 mg/dL — ABNORMAL HIGH (ref 70–99)
Glucose-Capillary: 128 mg/dL — ABNORMAL HIGH (ref 70–99)
Glucose-Capillary: 153 mg/dL — ABNORMAL HIGH (ref 70–99)
Glucose-Capillary: 167 mg/dL — ABNORMAL HIGH (ref 70–99)
Glucose-Capillary: 88 mg/dL (ref 70–99)

## 2023-09-22 LAB — CBC
HCT: 23.6 % — ABNORMAL LOW (ref 36.0–46.0)
Hemoglobin: 7.3 g/dL — ABNORMAL LOW (ref 12.0–15.0)
MCH: 30.8 pg (ref 26.0–34.0)
MCHC: 30.9 g/dL (ref 30.0–36.0)
MCV: 99.6 fL (ref 80.0–100.0)
Platelets: 262 10*3/uL (ref 150–400)
RBC: 2.37 MIL/uL — ABNORMAL LOW (ref 3.87–5.11)
RDW: 15.9 % — ABNORMAL HIGH (ref 11.5–15.5)
WBC: 7.1 10*3/uL (ref 4.0–10.5)
nRBC: 0 % (ref 0.0–0.2)

## 2023-09-22 NOTE — Progress Notes (Signed)
PROGRESS NOTE    Kaitlyn Hodges  ZOX:[id number] DOB: 1961/04/11 DOA: 09/04/2023 PCP: Malva Limes, MD    Assessment & Plan:   Principal Problem:   Severe sepsis [facility]) Active Problems:   Acute renal failure (HCC)   Acute encephalopathy   Achalasia   Pre-diabetes   Seizure disorder (HCC)   History of CVA (cerebrovascular accident)   Closed wedge compression fracture of T3 vertebra (HCC)   Melena   Hemorrhagic shock (HCC)   Gastrointestinal hemorrhage   Lactic acidosis   Pressure injury of skin   Malnutrition of moderate degree   Upper GI bleed   Thrombocytopenia (HCC)   Hypophosphatemia   Acute hypoxic respiratory failure (HCC)   Duodenitis   Oropharyngeal dysphagia   Oral phase dysphagia  Assessment and Plan: Severe sepsis: see Dr. Helayne Seminole notes on how pt met severe sepsis criteria. Resolved  CAP: completed abx course on 09/13/23.   Acute hypoxic respiratory failure: weaned off of supplemental oxygen. Resolved.    B/l pleural effusions: s/p right-sided thoracentesis with removal of 600 mL of pleural fluid on 09/16/2023.  Repeat chest x-ray shows improvement in pleural effusions.   Hemorrhagic shock: secondary to acute GI bleed, gastritis, duodenitis, oozing duodenal ulcer with visible vessel. S/p EGD on 09/08/2023, showed tortuous esophagus, diffuse inflammation in the esophagus and the small bowel which is not amenable to any endoscopic procedure.  Repeat EGD on 09/15/2023 showed tortuous esophagus, dilation in the entire esophagus, gastritis, duodenitis, oozing duodenal ulcer with a visible vessel.  Clips were placed.  She treated with argon plasma coagulation. Continue on PPI  Acute blood loss anemia: secondary to hemorrhagic shock. S/p 1 unit of pRBCs transfused so far. H&H are stabl   Thrombocytopenia: resolved.  S/p 5 units of platelets transfused this admission. 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 Likely secondary to hypovolemia & severe sepsis     Hypokalemia: WNL today   Hypophosphatemia: will monitor intermittently    Hypernatremia: resolved    Acute metabolic encephalopathy: improved but still confused. MRI brain done 1/9 showed dural thickening versus thin subdural collection overlying the left cerebral hemisphere, measuring up to 3-4 mm in thickness. Neurosurgery reviewed imaging.  No indication for surgery. EEG showed moderate diffuse encephalopathy. No seizures or epileptiform discharges. Continue supportive care.   Pre-diabetes: continue on SSI w/ accuchecks   Closed wedge compression fracture of T3 vertebra: incidental finding of a T3 compression fracture on imaging. S/p fall prior to admission. Tylenol prn for pain   Hx of CVA:CT of the head obtained showed chronic infarcts scattered along the left frontoparietal convexity in the left basal ganglia, as well as lacunar infarcts in the right thalamus. No acute findings.    Seizure disorder: hx of seizure disorder since CVA in 2010.  No reported seizures since that time. EEG showed moderate diffuse encephalopathy. Not on any seizure meds at home    Dysphagia: likely secondary to achalasia. Needs surgery as per pt's surgery but has yet to get this done.  She was reevaluated by speech therapist.  Unfortunately, patient can't have a safe diet at this point. S/p PEG tube placement on 09/21/2023. Continue w/ tube feeds   General weakness: PT/OT recs SNF      DVT prophylaxis: SCDs Code Status: full  Family Communication:  Disposition Plan: needs SNF placement  Level of care: Med-Surg   Consultants:    Procedures:   Antimicrobials:    Subjective: Pt c/o fatigue  Objective: Vitals:   09/21/23 1735 09/21/23 1931 09/22/23 0358 09/22/23 0854  BP: 128/76 130/74 114/71 109/72  Pulse: 88 92 85 86  Resp: 18 20 20 14   Temp: 99 F (37.2 C) 98.9 F (37.2 C) 98.2 F (36.8 C) 97.7 F (36.5 C)  TempSrc: Oral Oral Oral Oral  SpO2: 97% 99% 98% 99%  Weight:   76 kg    Height:        Intake/Output Summary (Last 24 hours) at 09/22/2023 0913 Last data filed at 09/22/2023 0411 Gross per 24 hour  Intake 20 ml  Output 1000 ml  Net -980 ml   Filed Weights   09/20/23 0410 09/21/23 0500 09/22/23 0358  Weight: 80.1 kg 78.1 kg 76 kg    Examination:  General exam: Appears calm and comfortable  Respiratory system: decreased breath sounds b/l Cardiovascular system: S1 & S2 +. No rubs, gallops or clicks.  Gastrointestinal system: Abdomen is nondistended, soft and nontender.  Normal bowel sounds heard. Central nervous system: Alert and oriented. Moves all extremities  Psychiatry: Judgement and insight appears poor. Flat mood and affect     Data Reviewed: I have personally reviewed following labs and imaging studies  CBC: Recent Labs  Lab 09/16/23 0437 09/17/23 0417 09/18/23 0525 09/21/23 0426 09/22/23 0709  WBC 7.8 6.5 6.4 6.3 7.1  NEUTROABS  --   --   --  3.9  --   HGB 8.3* 8.1* 8.1* 7.3* 7.3*  HCT 25.2* 25.1* 24.8* 22.8* 23.6*  MCV 93.3 97.3 94.3 97.4 99.6  PLT 119* 131* 155 208 262   Basic Metabolic Panel: Recent Labs  Lab 09/16/23 0437 09/17/23 0417 09/18/23 0525 09/19/23 0527 09/21/23 0426 09/22/23 0709  NA 146* 151* 152* 148* 145 145  K 4.2 4.0 4.2 4.7 4.9 4.8  CL 110 115* 119* 118* 119* 116*  CO2 26 29 27 25 22 22   GLUCOSE 268* 207* 195* 225* 197* 133*  BUN 67* 56* 58* 57* 52* 38*  CREATININE 1.14* 1.07* 0.90 0.79 0.75 0.79  CALCIUM 7.5* 7.7* 8.1* 7.9* 7.8* 8.0*  MG 1.8  --   --   --   --   --   PHOS 3.7  --   --   --   --   --    GFR: Estimated Creatinine Clearance: 75 mL/min (by C-G formula based on SCr of 0.79 mg/dL). Liver Function Tests: Recent Labs  Lab 09/16/23 0437  AST 24  ALT 21  ALKPHOS 149*  BILITOT 0.4  PROT 4.6*  ALBUMIN 1.6*   No results for input(s): "LIPASE", "AMYLASE" in the last 168 hours. No results for input(s): "AMMONIA" in the last 168 hours. Coagulation Profile: No results for input(s):  "INR", "PROTIME" in the last 168 hours. Cardiac Enzymes: No results for input(s): "CKTOTAL", "CKMB", "CKMBINDEX", "TROPONINI" in the last 168 hours. BNP (last 3 results) No results for input(s): "PROBNP" in the last 8760 hours. HbA1C: No results for input(s): "HGBA1C" in the last 72 hours. CBG: Recent Labs  Lab 09/21/23 0543 09/21/23 1144 09/21/23 1731 09/22/23 0001 09/22/23 0559  GLUCAP 185* 77 86 88 128*   Lipid Profile: No results for input(s): "CHOL", "HDL", "LDLCALC", "TRIG", "CHOLHDL", "LDLDIRECT" in the last 72 hours. Thyroid Function Tests: No results for input(s): "TSH", "T4TOTAL", "FREET4", "T3FREE", "THYROIDAB" in the last 72 hours. Anemia Panel: No results for input(s): "VITAMINB12", "FOLATE", "FERRITIN", "TIBC", "IRON", "RETICCTPCT" in the last 72 hours. Sepsis Labs: No results for input(s): "PROCALCITON", "LATICACIDVEN" in the last 168  hours.  Recent Results (from the past 240 hours)  Body fluid culture w Gram Stain     Status: None   Collection Time: 09/16/23  2:20 PM   Specimen: PATH Cytology Pleural fluid  Result Value Ref Range Status   Specimen Description   Final    FLUID Performed at [facility], [address] [place], [place] [address]    Special Requests   Final    CYTO PLEU Performed at [facility], [address] [place], [place] [address]    Gram Stain   Final    RARE WBC PRESENT, PREDOMINANTLY MONONUCLEAR NO ORGANISMS SEEN    Culture   Final    NO GROWTH 3 DAYS Performed at [facility], [address], [place], [place] [address]    Report Status 09/20/2023 FINAL  Final         Radiology Studies: No results found.      Scheduled Meds:  Chlorhexidine Gluconate Cloth  6 each Topical Daily   vitamin B-12  1,000 mcg Per Tube Daily   free water  200 mL Per Tube Q6H   insulin aspart  0-9 Units Subcutaneous Q6H   nutrition supplement (JUVEN)  1 packet Per Tube BID BM   mouth rinse  15 mL Mouth Rinse  4 times per day   pantoprazole (PROTONIX) IV  40 mg Intravenous Q12H   sodium chloride flush  3 mL Intravenous Q12H   Continuous Infusions:  feeding supplement (OSMOLITE 1.5 CAL) 45 mL/hr at 09/22/23 0442     LOS: 18 days      Charise Killian, MD [facility] Pager [phone]  If 7PM-7AM, please contact night-coverage [email or web address] 09/22/2023, 9:13 AM

## 2023-09-22 NOTE — [place] FL2 (Addendum)
[place] MEDICAID FL2 LEVEL OF CARE FORM     IDENTIFICATION  Patient Name: Kaitlyn Hodges Birthdate: 1961-03-09 Sex: female Admission Date (Current Location): 09/04/2023  [facility] and [place] Number:  Producer, television/film/video and Address:  [facility], [address] [place], [place] [address]      Provider Number: [id number]  Attending Physician Name and Address:  Charise Killian, MD  Relative Name and Phone Number:       Current Level of Care: Hospital Recommended Level of Care: Skilled Nursing Facility Prior Approval Number:    Date Approved/Denied:   PASRR Number: [id number]  Discharge Plan: SNF    Current Diagnoses: Patient Active Problem List   Diagnosis Date Noted   Oral phase dysphagia 09/22/2023   Oropharyngeal dysphagia 09/21/2023   Duodenitis 09/16/2023   Acute hypoxic respiratory failure (HCC) 09/13/2023   Thrombocytopenia (HCC) 09/11/2023   Hypophosphatemia 09/11/2023   Malnutrition of moderate degree 09/09/2023   Upper GI bleed 09/09/2023   Melena 09/07/2023   Hemorrhagic shock (HCC) 09/07/2023   Gastrointestinal hemorrhage 09/07/2023   Lactic acidosis 09/07/2023   Pressure injury of skin 09/07/2023   Severe sepsis (HCC) 09/04/2023   Pre-diabetes 09/04/2023   Acute renal failure (HCC) 09/04/2023   Acute encephalopathy 09/04/2023   History of CVA (cerebrovascular accident) 09/04/2023   Closed wedge compression fracture of T3 vertebra (HCC) 09/04/2023   Achalasia 04/12/2020   Esophageal dysphagia    Erosive esophagitis    Food impaction of esophagus    Acute non-recurrent sinusitis 10/06/2019   Aortic atherosclerosis (HCC) 04/10/2019   Parotiditis 11/19/2015   Sialoadenitis 11/19/2015   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   Depression 02/08/2015   History of diabetes mellitus, type II 02/08/2015   History of kidney stones 02/08/2015   Hypercholesteremia 02/08/2015   Seizure disorder (HCC) 02/08/2015   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   Tobacco abuse 02/08/2015    Orientation RESPIRATION BLADDER Height & Weight     Self  Normal Incontinent, External catheter Weight: 167 lb 8.8 oz (76 kg) Height:  5\' 6"  (167.6 cm)  BEHAVIORAL SYMPTOMS/MOOD NEUROLOGICAL BOWEL NUTRITION STATUS       Incontinent Diet (see d/c summary) PEG tube  AMBULATORY STATUS COMMUNICATION OF NEEDS Skin   Extensive Assist Verbally PU Stage and Appropriate Care (Pressure Injury stage 2 buttocks medial; pressure injury stage 2 buttocks left)                       Personal Care Assistance Level of Assistance  Bathing, Feeding, Dressing Bathing Assistance: Maximum assistance Feeding assistance: Maximum assistance Dressing Assistance: Maximum assistance     Functional Limitations Info  Sight, Hearing, Speech Sight Info: Adequate Hearing Info: Impaired Speech Info: Impaired    SPECIAL CARE FACTORS FREQUENCY  OT (By licensed OT), PT (By licensed PT)     PT Frequency: 5x/week OT Frequency: 5x/week            Contractures Contractures Info: Not present    Additional Factors Info  Code Status, Allergies Code Status Info: Full code Allergies Info: no known allergies           Current Medications (09/22/2023):  This is the current hospital active medication list Current Facility-Administered Medications  Medication Dose Route Frequency Provider Last Rate Last Admin   acetaminophen (TYLENOL) tablet 650 mg  650 mg Per Tube Q6H PRN Lurene Shadow, MD   650 mg at 09/22/23 5621   Or  acetaminophen (TYLENOL) suppository 650 mg  650 mg Rectal Q6H PRN Lurene Shadow, MD       Chlorhexidine Gluconate Cloth 2 % PADS 6 each  6 each Topical Daily Lurene Shadow, MD   6 each at 09/22/23 0615   cyanocobalamin (VITAMIN B12) tablet 1,000 mcg  1,000 mcg Per Tube Daily Lurene Shadow, MD   1,000 mcg at 09/22/23 1226   feeding supplement (OSMOLITE 1.5 CAL) liquid 1,000 mL  1,000 mL Per Tube Continuous Lurene Shadow, MD 45 mL/hr  at 09/22/23 0442 Rate Change at 09/22/23 0442   free water 200 mL  200 mL Per Tube Q6H Lurene Shadow, MD   200 mL at 09/22/23 1227   insulin aspart (novoLOG) injection 0-9 Units  0-9 Units Subcutaneous Q6H Lurene Shadow, MD   2 Units at 09/22/23 1226   liver oil-zinc oxide (DESITIN) 40 % ointment   Topical PRN Lurene Shadow, MD   Given at 09/12/23 1349   nutrition supplement (JUVEN) (JUVEN) powder packet 1 packet  1 packet Per Tube BID BM Lurene Shadow, MD   1 packet at 09/22/23 1226   ondansetron (ZOFRAN) tablet 4 mg  4 mg Per Tube Q6H PRN Lurene Shadow, MD       Or   ondansetron (ZOFRAN) injection 4 mg  4 mg Intravenous Q6H PRN Lurene Shadow, MD       Oral care mouth rinse  15 mL Mouth Rinse 4 times per day Lurene Shadow, MD   15 mL at 09/22/23 1227   Oral care mouth rinse  15 mL Mouth Rinse PRN Lurene Shadow, MD       pantoprazole (PROTONIX) injection 40 mg  40 mg Intravenous Q12H Lurene Shadow, MD   40 mg at 09/22/23 1226   sodium chloride flush (NS) 0.9 % injection 3 mL  3 mL Intravenous Q12H Lurene Shadow, MD   3 mL at 09/22/23 1227     Discharge Medications: Please see discharge summary for a list of discharge medications.  Relevant Imaging Results:  Relevant Lab Results:   Additional Information SSN [id number] [place], [place]

## 2023-09-22 NOTE — Progress Notes (Signed)
Occupational Therapy Treatment Patient Details Name: Kaitlyn Hodges MRN: [id number] DOB: 07/31/1961 Today's Date: 09/22/2023   History of present illness Kaitlyn Hodges is a 63 y.o. female with medical history significant of prediabetes, CVA, seizure disorder, type I achalasia,tobacco use disorder, erosive esophagitis who presents to the ED due to altered mental status. Patient's daughter states that over the last 1 month, patient has been experiencing significant shortness of breath and cough that has progressively worsened.  Then for the last 1 week, she has been experiencing persistent nausea, vomiting and poor p.o. intake.   OT comments  Pt is supine in bed on arrival. Easily arousable and agreeable to PT/OT co-tx session with increased encouragement. She appears to have moderate abdominal pain from PEG tube placement yesterday. Continues to need increased assist (Max A x2) for all bed mobility with max cueing for initiation. Needed Max to Mod A constant to maintain seated balance at EOB during static and dynamic activity while washing her face at EOB. Therapist brushed the back of her head for her. Pt with R lateral lean and noted fatigue and pain, therefore returned to supine and repositioned to [facility] with Max A x2. Pt left with all needs in place and will cont to require skilled acute OT services to maximize her safety and IND to return to PLOF.       If plan is discharge home, recommend the following:  Two people to help with walking and/or transfers;Two people to help with bathing/dressing/bathroom   Equipment Recommendations  Hospital bed;Hoyer lift    Recommendations for Other Services      Precautions / Restrictions Precautions Precautions: Fall Precaution Comments: PEG Restrictions Weight Bearing Restrictions Per Provider Order: No       Mobility Bed Mobility Overal bed mobility: Needs Assistance Bed Mobility: Sit to Supine, Supine to Sit     Supine to sit: Max assist, +2 for  safety/equipment, +2 for physical assistance Sit to supine: Max assist, +2 for safety/equipment, +2 for physical assistance   General bed mobility comments: verb cues and step by step instruction to initate movement with hands on assist needed for trunk and BLEs to perform all bed mobility    Transfers                   General transfer comment: deferred d/t Max A to maintain seated balance at EOB     Balance Overall balance assessment: Needs assistance Sitting-balance support: Feet supported, Bilateral upper extremity supported Sitting balance-Leahy Scale: Poor Sitting balance - Comments: mod-maxA to maintain balance sitting EOB with R lateral lean Postural control: Right lateral lean                                 ADL either performed or assessed with clinical judgement   ADL Overall ADL's : Needs assistance/impaired     Grooming: Wash/dry face;Sitting;Supervision/safety Grooming Details (indicate cue type and reason): able to complete however needed Max A to maintain seated balance at EOB in order to perform d/t R lateral lean; total A to comb hair                                    Extremity/Trunk Assessment              Vision       Perception     Praxis  Cognition Arousal: Alert Behavior During Therapy: Flat affect, Anxious Overall Cognitive Status: Impaired/Different from baseline Area of Impairment: Orientation, Following commands, Safety/judgement, Problem solving                 Orientation Level: Disoriented to, Time, Situation, Place Current Attention Level: Sustained   Following Commands: Follows one step commands inconsistently, Follows one step commands with increased time Safety/Judgement: Decreased awareness of safety, Decreased awareness of deficits Awareness: Intellectual Problem Solving: Slow processing, Decreased initiation, Difficulty sequencing, Requires verbal cues, Requires tactile  cues General Comments: delayed responses and only communicating verbally <50% of time. follows one step commands with increased time        Exercises Other Exercises Other Exercises: Edu on importance of working with therapy to get OOB to maximize her strength.    Shoulder Instructions       General Comments PEG tube, IV, and rectal tube intact pre/post session    Pertinent Vitals/ Pain       Pain Assessment Pain Assessment: Faces Faces Pain Scale: Hurts even more Pain Location: abdomen - PEG site Pain Descriptors / Indicators: Discomfort, Grimacing Pain Intervention(s): Monitored during session, Repositioned  Home Living                                          Prior Functioning/Environment              Frequency  Min 1X/week        Progress Toward Goals  OT Goals(current goals can now be found in the care plan section)  Progress towards OT goals: Progressing toward goals  Acute Rehab OT Goals Patient Stated Goal: none OT Goal Formulation: Patient unable to participate in goal setting Time For Goal Achievement: 10/01/23 Potential to Achieve Goals: Fair  Plan      Co-evaluation    PT/OT/SLP Co-Evaluation/Treatment: Yes Reason for Co-Treatment: Complexity of the patient's impairments (multi-system involvement);Necessary to address cognition/behavior during functional activity;For patient/therapist safety;To address functional/ADL transfers PT goals addressed during session: Mobility/safety with mobility;Balance OT goals addressed during session: ADL's and self-care      AM-PAC OT "6 Clicks" Daily Activity     Outcome Measure   Help from another person eating meals?: A Little Help from another person taking care of personal grooming?: A Lot Help from another person toileting, which includes using toliet, bedpan, or urinal?: Total Help from another person bathing (including washing, rinsing, drying)?: A Lot Help from another person to  put on and taking off regular upper body clothing?: A Lot Help from another person to put on and taking off regular lower body clothing?: Total 6 Click Score: 11    End of Session    OT Visit Diagnosis: Other abnormalities of gait and mobility (R26.89);Muscle weakness (generalized) (M62.81)   Activity Tolerance Patient limited by fatigue;Patient limited by pain   Patient Left in bed;with call bell/phone within reach;with bed alarm set   Nurse Communication          Time: 1610-9604 OT Time Calculation (min): 19 min  Charges: OT General Charges $OT Visit: 1 Visit OT Treatments $Self Care/Home Management : 8-22 mins  Kaitlyn Hodges, OTR/L  09/22/23, 1:50 PM   Kaitlyn Hodges 09/22/2023, 1:48 PM

## 2023-09-22 NOTE — Progress Notes (Signed)
Kaitlyn Minium, MD [facility]   [address] [place], [place] [address] Phone: [phone] Fax : [phone]   Subjective: The patient had PEG tube placement yesterday.  She is resting comfortably today with no issues overnight.  The patient is already having tube feeds through the PEG tube.   Objective: Vital signs in last 24 hours: Vitals:   09/21/23 1624 09/21/23 1735 09/21/23 1931 09/22/23 0358  BP: 111/75 128/76 130/74 114/71  Pulse: 78 88 92 85  Resp: 16 18 20 20   Temp:  99 F (37.2 C) 98.9 F (37.2 C) 98.2 F (36.8 C)  TempSrc:  Oral Oral Oral  SpO2: 100% 97% 99% 98%  Weight:    76 kg  Height:       Weight change: -2.1 kg  Intake/Output Summary (Last 24 hours) at 09/22/2023 9629 Last data filed at 09/22/2023 0411 Gross per 24 hour  Intake 20 ml  Output 1000 ml  Net -980 ml     Exam: Heart:: Regular rate and rhythm or without murmur or extra heart sounds Lungs: normal and clear to auscultation and percussion Abdomen: soft, nontender, normal bowel sounds   Lab Results: @LABTEST2 @ Micro Results: Recent Results (from the past 240 hours)  Body fluid culture w Gram Stain     Status: None   Collection Time: 09/16/23  2:20 PM   Specimen: PATH Cytology Pleural fluid  Result Value Ref Range Status   Specimen Description   Final    FLUID Performed at [facility], [address], [place], [place] [address]    Special Requests   Final    CYTO PLEU Performed at [facility], [address] [place], [place] [address]    Gram Stain   Final    RARE WBC PRESENT, PREDOMINANTLY MONONUCLEAR NO ORGANISMS SEEN    Culture   Final    NO GROWTH 3 DAYS Performed at [facility], [address], [place], [place] [address]    Report Status 09/20/2023 FINAL  Final   Studies/Results: No results found. Medications: I have reviewed the patient's current medications. Scheduled Meds:  Chlorhexidine Gluconate Cloth  6 each Topical Daily    vitamin B-12  1,000 mcg Per Tube Daily   free water  200 mL Per Tube Q6H   insulin aspart  0-9 Units Subcutaneous Q6H   nutrition supplement (JUVEN)  1 packet Per Tube BID BM   mouth rinse  15 mL Mouth Rinse 4 times per day   pantoprazole (PROTONIX) IV  40 mg Intravenous Q12H   sodium chloride flush  3 mL Intravenous Q12H   Continuous Infusions:  feeding supplement (OSMOLITE 1.5 CAL) 45 mL/hr at 09/22/23 0442   PRN Meds:.acetaminophen **OR** acetaminophen, liver oil-zinc oxide, ondansetron **OR** ondansetron (ZOFRAN) IV, mouth rinse   Assessment: Principal Problem:   Severe sepsis (HCC) Active Problems:   Seizure disorder (HCC)   Achalasia   Pre-diabetes   Acute renal failure (HCC)   Acute encephalopathy   History of CVA (cerebrovascular accident)   Closed wedge compression fracture of T3 vertebra (HCC)   Melena   Hemorrhagic shock (HCC)   Gastrointestinal hemorrhage   Lactic acidosis   Pressure injury of skin   Malnutrition of moderate degree   Upper GI bleed   Thrombocytopenia (HCC)   Hypophosphatemia   Acute hypoxic respiratory failure (HCC)   Duodenitis   Oropharyngeal dysphagia    Plan: Patient is status post PEG tube placement.  The PEG tube  appears to be functioning well.  Nothing further to do from a GI point of view.  I will sign off.  Please call if any further GI concerns or questions.  We would like to thank you for the opportunity to participate in the care of Kaitlyn Hodges.    LOS: 18 days   Kaitlyn Minium, MD.FACG 09/22/2023, 7:37 AM Pager [phone] 7am-5pm  Check AMION for 5pm -7am coverage and on weekends

## 2023-09-22 NOTE — Progress Notes (Signed)
Physical Therapy Treatment Patient Details Name: Kaitlyn Hodges MRN: [id number] DOB: August 22, 1961 Today's Date: 09/22/2023   History of Present Illness Kaitlyn Hodges is a 63 y.o. female with medical history significant of prediabetes, CVA, seizure disorder, type I achalasia,tobacco use disorder, erosive esophagitis who presents to the ED due to altered mental status. Patient's daughter states that over the last 1 month, patient has been experiencing significant shortness of breath and cough that has progressively worsened.  Then for the last 1 week, she has been experiencing persistent nausea, vomiting and poor p.o. intake.    PT Comments  Patient supine in bed on arrival. Agreeable to PT/OT treatment session. Oriented to self with delayed responses and verbally communicating <50% of time. Complaining of abdominal pain at PEG site throughout mobility attempts. Required maxA+2 for bed mobility and mod-maxA for maintaining sitting balance with R lateral lean. Cues for cervical extension throughout. Unable to progress to standing this date due to poor sitting balance. Discharge plan remains appropriate.     If plan is discharge home, recommend the following: Two people to help with walking and/or transfers;Two people to help with bathing/dressing/bathroom   Can travel by private vehicle     No  Equipment Recommendations  Other (comment) (defer to next venue)    Recommendations for Other Services       Precautions / Restrictions Precautions Precautions: Fall Precaution Comments: PEG Restrictions Weight Bearing Restrictions Per Provider Order: No     Mobility  Bed Mobility Overal bed mobility: Needs Assistance Bed Mobility: Sit to Supine, Supine to Sit     Supine to sit: Max assist, +2 for safety/equipment, +2 for physical assistance Sit to supine: Max assist, +2 for safety/equipment, +2 for physical assistance   General bed mobility comments: with step by step cueing and facilitation,  patient able to assist wiht BLE advancement and reaching for bed rail. overall, maxA+2 to complete    Transfers                   General transfer comment: unable to sit unsupported so deferred at this time. Complaining of pain while sitting EOB    Ambulation/Gait                   Stairs             Wheelchair Mobility     Tilt Bed    Modified Rankin (Stroke Patients Only)       Balance Overall balance assessment: Needs assistance Sitting-balance support: Feet supported, Bilateral upper extremity supported Sitting balance-Leahy Scale: Poor Sitting balance - Comments: mod-maxA to maintain balance sitting EOB with R lateral lean Postural control: Right lateral lean                                  Cognition Arousal: Alert Behavior During Therapy: Flat affect, Anxious Overall Cognitive Status: Impaired/Different from baseline Area of Impairment: Orientation, Following commands, Safety/judgement, Problem solving                 Orientation Level: Disoriented to, Time, Situation, Place Current Attention Level: Sustained   Following Commands: Follows one step commands inconsistently, Follows one step commands with increased time Safety/Judgement: Decreased awareness of safety, Decreased awareness of deficits Awareness: Intellectual Problem Solving: Slow processing, Decreased initiation, Difficulty sequencing, Requires verbal cues, Requires tactile cues General Comments: delayed responses and only communicating verbally <50% of time. follows one step commands  with increased time        Exercises      General Comments        Pertinent Vitals/Pain Pain Assessment Pain Assessment: Faces Faces Pain Scale: Hurts even more Pain Location: abdomen - PEG site Pain Descriptors / Indicators: Discomfort, Grimacing Pain Intervention(s): Monitored during session, Repositioned    Home Living                           Prior Function            PT Goals (current goals can now be found in the care plan section) Acute Rehab PT Goals PT Goal Formulation: With family Time For Goal Achievement: 10/01/23 Potential to Achieve Goals: Fair Progress towards PT goals: Progressing toward goals    Frequency    Min 1X/week      PT Plan      Co-evaluation PT/OT/SLP Co-Evaluation/Treatment: Yes Reason for Co-Treatment: Complexity of the patient's impairments (multi-system involvement);Necessary to address cognition/behavior during functional activity;For patient/therapist safety;To address functional/ADL transfers PT goals addressed during session: Mobility/safety with mobility;Balance        AM-PAC PT "6 Clicks" Mobility   Outcome Measure  Help needed turning from your back to your side while in a flat bed without using bedrails?: A Lot Help needed moving from lying on your back to sitting on the side of a flat bed without using bedrails?: Total Help needed moving to and from a bed to a chair (including a wheelchair)?: Total Help needed standing up from a chair using your arms (e.g., wheelchair or bedside chair)?: Total Help needed to walk in hospital room?: Total Help needed climbing 3-5 steps with a railing? : Total 6 Click Score: 7    End of Session   Activity Tolerance: Patient limited by fatigue Patient left: in bed;with call bell/phone within reach;with bed alarm set Nurse Communication: Mobility status PT Visit Diagnosis: Muscle weakness (generalized) (M62.81);Other abnormalities of gait and mobility (R26.89);Difficulty in walking, not elsewhere classified (R26.2)     Time: 2536-6440 PT Time Calculation (min) (ACUTE ONLY): 23 min  Charges:    $Therapeutic Activity: 8-22 mins PT General Charges $$ ACUTE PT VISIT: 1 Visit                     Maylon Peppers, PT, DPT Physical Therapist - [facility]  [facility]    Ayla Dunigan A [place] 09/22/2023, 1:25 PM

## 2023-09-22 NOTE — TOC Progression Note (Signed)
Transition of Care [facility]) - Progression Note    Patient Details  Name: Kaitlyn Hodges MRN: [id number] Date of Birth: 10-24-1960  Transition [facility]) CM/SW Contact  Erin Sons, [place] Phone Number: 09/22/2023, 12:43 PM  Clinical Narrative:     CSW called pt's daughter to discuss SNF recommendation. Daughter agreeable to SNF workup. She would prefer a facility in [place] area though would be open to SNF's further away if pt does not have many options in [place]. Fl2 completed and bed requests sent in hub.   Expected Discharge Plan: Skilled Nursing Facility Barriers to Discharge: Continued Medical Work up, English as a second language teacher, SNF Pending bed offer  Expected Discharge Plan and Services     Post Acute Care Choice: Skilled Nursing Facility                                         Social Determinants of Health (SDOH) Interventions SDOH Screenings   Food Insecurity: No Food Insecurity (09/06/2023)  Housing: Low Risk  (09/06/2023)  Transportation Needs: No Transportation Needs (09/06/2023)  Utilities: Not At Risk (09/06/2023)  Alcohol Screen: Low Risk  (03/21/2021)  Depression (PHQ2-9): Medium Risk (03/21/2021)  Tobacco Use: High Risk (09/21/2023)    Readmission Risk Interventions     No data to display

## 2023-09-22 NOTE — Progress Notes (Signed)
Palliative Care Progress Note, Assessment & Plan   Patient Name: Kaitlyn Hodges       Date: 09/22/2023 DOB: February 28, 1961  Age: 63 y.o. MRN#: [id number] Attending Physician: Charise Killian, MD Primary Care Physician: Malva Limes, MD Admit Date: 09/04/2023  Subjective: Patient is lying in bed in no apparent distress.  She is asleep but easily awakens to my presence.  She is able to make her wishes known.  No family or friends present during my visit.  HPI: 63 y.o. female  with past medical history of CVA (2010), seizure disorder, prediabetes, type I achalasia, tobacco use, and erosive esophagitis admitted on 09/04/2023 with AMS.   As per chart review, daughter endorsed cough and shortness of breath for about a month PTA.  Daughter also endorsed nausea with vomiting and poor oral intake for approximately 1 week PTA.   Course of hospitalization has revealed a pan scan notable for possible left parotiditis, central airway thickening, and patchy airspace opacities in the bases with an age indeterminate T3 fracture.   Patient's respiratory panel was negative.  She is being treated for community-acquired ammonia with acute hypoxic respiratory failure, bilateral pleural effusions with compressive atelectasis, hemorrhagic shock secondary to acute GI bleed with acute blood loss anemia, AKI, thrombocytopenia disease hematology consulted), and acute metabolic encephalopathy.   MRI of brain revealed dural thickening versus thin subdural collection overlying the left cerebral hemisphere, measuring up to 3-4 mm in thickness.  Neurosurgery reviewed imaging and recommended no indication for surgery at this time.   EEG revealed moderate diffuse encephalopathy with no seizures or epileptiform discharges noted.   CT of head  revealed chronic infarcts but no acute findings.  1/21, peg tube placed without complication.    PMT was consulted to discuss boundaries and goals of care.  Summary of counseling/coordination of care: Extensive chart review completed prior to meeting patient including labs, vital signs, imaging, progress notes, orders, and available advanced directive documents from current and previous encounters.   After reviewing the patient's chart and assessing the patient at bedside, I spoke with patient in regards to symptom management and goals of care.   Symptoms assessed.  Patient endorses diffuse left lower quadrant abdominal pain.  She is unable to describe the pain for me.  When given options, she cannot say yes or no to what kind of pain she is experiencing.  Palpation reveals mild discomfort.  However, after changing the subject and discussing something different, I circled back to discuss patient's abdominal pain and she denied pain.  I counseled patient on tube feedings, feeding tube, and importance of nutrition and patient's overall prognosis.  She gave me a thumbs up.  She did not make many vocalizations during my visit but her vocalizations were appropriate when she did make them.  No adjustment to [facility] at this time.  Full code and full scope remain.  TOC following closely for discharge planning.  PMT remains available to patient throughout her hospitalization.  We will step back from daily visits and monitor the patient.  Please reengage when appropriate.  Physical Exam Vitals reviewed.  Constitutional:      General: She is not in acute distress.  Appearance: She is obese.  HENT:     Head: Normocephalic.     Mouth/Throat:     Mouth: Mucous membranes are moist.  Eyes:     Pupils: Pupils are equal, round, and reactive to light.  Pulmonary:     Effort: Pulmonary effort is normal.  Abdominal:     Palpations: Abdomen is soft.  Skin:    General: Skin is warm and dry.   Neurological:     Mental Status: She is alert.     Comments: Oriented to self and place  Psychiatric:        Mood and Affect: Mood normal.        Judgment: Judgment normal.             Total Time 35 minutes   Time spent includes: Detailed review of medical records (labs, imaging, vital signs), medically appropriate exam (mental status, respiratory, cardiac, skin), discussed with treatment team, counseling and educating patient, family and staff, documenting clinical information, medication management and coordination of care.  Samara Deist L. Bonita Quin, DNP, FNP-BC Palliative Medicine Team

## 2023-09-23 DIAGNOSIS — A419 Sepsis, unspecified organism: Secondary | ICD-10-CM | POA: Diagnosis not present

## 2023-09-23 DIAGNOSIS — E872 Acidosis, unspecified: Secondary | ICD-10-CM | POA: Diagnosis not present

## 2023-09-23 DIAGNOSIS — R1311 Dysphagia, oral phase: Secondary | ICD-10-CM | POA: Diagnosis not present

## 2023-09-23 DIAGNOSIS — N179 Acute kidney failure, unspecified: Secondary | ICD-10-CM | POA: Diagnosis not present

## 2023-09-23 DIAGNOSIS — E538 Deficiency of other specified B group vitamins: Secondary | ICD-10-CM | POA: Diagnosis not present

## 2023-09-23 LAB — GLUCOSE, CAPILLARY
Glucose-Capillary: 125 mg/dL — ABNORMAL HIGH (ref 70–99)
Glucose-Capillary: 130 mg/dL — ABNORMAL HIGH (ref 70–99)
Glucose-Capillary: 144 mg/dL — ABNORMAL HIGH (ref 70–99)
Glucose-Capillary: 151 mg/dL — ABNORMAL HIGH (ref 70–99)

## 2023-09-23 LAB — CBC
HCT: 23.2 % — ABNORMAL LOW (ref 36.0–46.0)
Hemoglobin: 7.4 g/dL — ABNORMAL LOW (ref 12.0–15.0)
MCH: 31.1 pg (ref 26.0–34.0)
MCHC: 31.9 g/dL (ref 30.0–36.0)
MCV: 97.5 fL (ref 80.0–100.0)
Platelets: 270 10*3/uL (ref 150–400)
RBC: 2.38 MIL/uL — ABNORMAL LOW (ref 3.87–5.11)
RDW: 16 % — ABNORMAL HIGH (ref 11.5–15.5)
WBC: 8.5 10*3/uL (ref 4.0–10.5)
nRBC: 0 % (ref 0.0–0.2)

## 2023-09-23 LAB — BASIC METABOLIC PANEL
Anion gap: 7 (ref 5–15)
BUN: 43 mg/dL — ABNORMAL HIGH (ref 8–23)
CO2: 22 mmol/L (ref 22–32)
Calcium: 8 mg/dL — ABNORMAL LOW (ref 8.9–10.3)
Chloride: 115 mmol/L — ABNORMAL HIGH (ref 98–111)
Creatinine, Ser: 0.75 mg/dL (ref 0.44–1.00)
GFR, Estimated: >60 mL/min (ref >60)
Glucose, Bld: 159 mg/dL — ABNORMAL HIGH (ref 70–99)
Potassium: 4.7 mmol/L (ref 3.5–5.1)
Sodium: 144 mmol/L (ref 135–145)

## 2023-09-23 NOTE — Progress Notes (Signed)
PROGRESS NOTE    Kaitlyn Hodges  ION:[id number] DOB: 08-Jun-1961 DOA: 09/04/2023 PCP: Malva Limes, MD    Assessment & Plan:   Principal Problem:   Severe sepsis [facility]) Active Problems:   Acute renal failure (HCC)   Acute encephalopathy   Achalasia   Pre-diabetes   Seizure disorder (HCC)   History of CVA (cerebrovascular accident)   Closed wedge compression fracture of T3 vertebra (HCC)   Melena   Hemorrhagic shock (HCC)   Gastrointestinal hemorrhage   Lactic acidosis   Pressure injury of skin   Malnutrition of moderate degree   Upper GI bleed   Thrombocytopenia (HCC)   Hypophosphatemia   Acute hypoxic respiratory failure (HCC)   Duodenitis   Oropharyngeal dysphagia   Oral phase dysphagia  Assessment and Plan: Severe sepsis: see Dr. Helayne Seminole notes on how pt met severe sepsis criteria. Resolved  CAP: completed abx course on 09/13/23.   Acute hypoxic respiratory failure: weaned off of supplemental oxygen. Resolved.    B/l pleural effusions: s/p right-sided thoracentesis with removal of 600 mL of pleural fluid on 09/16/2023.  Repeat chest x-ray shows improvement in pleural effusions.   Hemorrhagic shock: secondary to acute GI bleed, gastritis, duodenitis, oozing duodenal ulcer with visible vessel. S/p EGD on 09/08/2023, showed tortuous esophagus, diffuse inflammation in the esophagus and the small bowel which is not amenable to any endoscopic procedure. Repeat EGD on 09/15/2023 showed tortuous esophagus, dilation in the entire esophagus, gastritis, duodenitis, oozing duodenal ulcer with a visible vessel.  Clips were placed.  She treated with argon plasma coagulation. Continue on PPI   Acute blood loss anemia: secondary to hemorrhagic shock. S/p 1 unit of pRBCs transfused so far. H&H are stable    Thrombocytopenia: resolved. S/p 5 units of platelets transfused this admission. 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 Likely secondary to hypovolemia & severe sepsis     Hypokalemia: WNL today   Hypophosphatemia: will monitor intermittently    Hypernatremia: resolved    Acute metabolic encephalopathy: mental status waxes & wanes. MRI brain done 1/9 showed dural thickening versus thin subdural collection overlying the left cerebral hemisphere, measuring up to 3-4 mm in thickness. Neurosurgery reviewed imaging.  No indication for surgery. EEG showed moderate diffuse encephalopathy. No seizures or epileptiform discharges. Continue w/ supportive care    Pre-diabetes: continue on SSI w/ accuchecks    Closed wedge compression fracture of T3 vertebra: incidental finding of a T3 compression fracture on imaging. S/p fall prior to admission. Tylenol prn for pain   Hx of CVA: not on aspirin or statin     Seizure disorder: hx of seizure disorder since CVA in 2010.  No reported seizures since that time. EEG showed moderate diffuse encephalopathy. Not on any seizures meds at home     Dysphagia: likely secondary to achalasia. Needs surgery as per pt's surgery but has yet to get this done.  She was reevaluated by speech therapist.  Unfortunately, patient can't have a safe diet at this point. S/p PEG tube placement on 09/21/2023. Continue w/ tube feeds   General weakness: PT/OT recs SNF      DVT prophylaxis: SCDs Code Status: full  Family Communication:  Disposition Plan: needs SNF placement  Level of care: Med-Surg  Status is: Inpatient Remains inpatient appropriate because: medically stable. Needs SNF placement      Consultants:    Procedures:   Antimicrobials:    Subjective: Pt c/o malaise   Objective: Vitals:  09/22/23 2030 09/22/23 2031 09/23/23 0315 09/23/23 0500  BP: 112/75  99/68   Pulse: (!) 110 (!) 110 96   Resp: 20  20   Temp: 98.1 F (36.7 C)  98.5 F (36.9 C)   TempSrc: Oral  Oral   SpO2: 97% 100% 98%   Weight:    77.9 kg  Height:        Intake/Output Summary (Last 24 hours) at 09/23/2023 0925 Last data filed at 09/23/2023  2130 Gross per 24 hour  Intake --  Output 1000 ml  Net -1000 ml   Filed Weights   09/21/23 0500 09/22/23 0358 09/23/23 0500  Weight: 78.1 kg 76 kg 77.9 kg    Examination:  General exam: appears comfortable   Respiratory system: diminished breath sounds b/l Cardiovascular system: S1/S2+. No rubs or clicks Gastrointestinal system: Abd is soft, NT, ND & hypoactive bowel sounds  Central nervous system: alert & awake. Moves all extremities  Psychiatry: Judgement and insight appears poor. Flat mood and affect    Data Reviewed: I have personally reviewed following labs and imaging studies  CBC: Recent Labs  Lab 09/17/23 0417 09/18/23 0525 09/21/23 0426 09/22/23 0709 09/23/23 0639  WBC 6.5 6.4 6.3 7.1 8.5  NEUTROABS  --   --  3.9  --   --   HGB 8.1* 8.1* 7.3* 7.3* 7.4*  HCT 25.1* 24.8* 22.8* 23.6* 23.2*  MCV 97.3 94.3 97.4 99.6 97.5  PLT 131* 155 208 262 270   Basic Metabolic Panel: Recent Labs  Lab 09/18/23 0525 09/19/23 0527 09/21/23 0426 09/22/23 0709 09/23/23 0639  NA 152* 148* 145 145 144  K 4.2 4.7 4.9 4.8 4.7  CL 119* 118* 119* 116* 115*  CO2 27 25 22 22 22   GLUCOSE 195* 225* 197* 133* 159*  BUN 58* 57* 52* 38* 43*  CREATININE 0.90 0.79 0.75 0.79 0.75  CALCIUM 8.1* 7.9* 7.8* 8.0* 8.0*   GFR: Estimated Creatinine Clearance: 75.8 mL/min (by C-G formula based on SCr of 0.75 mg/dL). Liver Function Tests: No results for input(s): "AST", "ALT", "ALKPHOS", "BILITOT", "PROT", "ALBUMIN" in the last 168 hours.  No results for input(s): "LIPASE", "AMYLASE" in the last 168 hours. No results for input(s): "AMMONIA" in the last 168 hours. Coagulation Profile: No results for input(s): "INR", "PROTIME" in the last 168 hours. Cardiac Enzymes: No results for input(s): "CKTOTAL", "CKMB", "CKMBINDEX", "TROPONINI" in the last 168 hours. BNP (last 3 results) No results for input(s): "PROBNP" in the last 8760 hours. HbA1C: No results for input(s): "HGBA1C" in the last 72  hours. CBG: Recent Labs  Lab 09/22/23 0559 09/22/23 1144 09/22/23 1753 09/22/23 2336 09/23/23 0600  GLUCAP 128* 153* 167* 102* 144*   Lipid Profile: No results for input(s): "CHOL", "HDL", "LDLCALC", "TRIG", "CHOLHDL", "LDLDIRECT" in the last 72 hours. Thyroid Function Tests: No results for input(s): "TSH", "T4TOTAL", "FREET4", "T3FREE", "THYROIDAB" in the last 72 hours. Anemia Panel: No results for input(s): "VITAMINB12", "FOLATE", "FERRITIN", "TIBC", "IRON", "RETICCTPCT" in the last 72 hours. Sepsis Labs: No results for input(s): "PROCALCITON", "LATICACIDVEN" in the last 168 hours.  Recent Results (from the past 240 hours)  Body fluid culture w Gram Stain     Status: None   Collection Time: 09/16/23  2:20 PM   Specimen: PATH Cytology Pleural fluid  Result Value Ref Range Status   Specimen Description   Final    FLUID Performed at [facility], [address], [place], [place] [address]    Special Requests  Final    CYTO PLEU Performed at [facility], [address] [place], [place] [address]    Gram Stain   Final    RARE WBC PRESENT, PREDOMINANTLY MONONUCLEAR NO ORGANISMS SEEN    Culture   Final    NO GROWTH 3 DAYS Performed at [facility], [address] [place], [place] [address]    Report Status 09/20/2023 FINAL  Final         Radiology Studies: No results found.      Scheduled Meds:  Chlorhexidine Gluconate Cloth  6 each Topical Daily   vitamin B-12  1,000 mcg Per Tube Daily   free water  200 mL Per Tube Q6H   insulin aspart  0-9 Units Subcutaneous Q6H   nutrition supplement (JUVEN)  1 packet Per Tube BID BM   mouth rinse  15 mL Mouth Rinse 4 times per day   pantoprazole (PROTONIX) IV  40 mg Intravenous Q12H   sodium chloride flush  3 mL Intravenous Q12H   Continuous Infusions:  feeding supplement (OSMOLITE 1.5 CAL) 1,000 mL (09/22/23 2159)     LOS: 19 days      Charise Killian, MD [facility] Pager [phone]  If 7PM-7AM, please contact night-coverage [email or web address] 09/23/2023, 9:25 AM

## 2023-09-23 NOTE — Progress Notes (Signed)
Nutrition Follow-up  DOCUMENTATION CODES:   Non-severe (moderate) malnutrition in context of chronic illness  INTERVENTION:   -Continue TF via PEG:    Initiate Osmolite 1.5 @ 55 ml/hr   200 ml free water flush every 4 hours   Tube feeding regimen provides 1980 kcal (100% of needs), 83 grams of protein, and 1006 ml of H2O. Total free water: 2206 ml daily   -Continue Cyanocobalamin 1000 mcg daily via tube    -Continue -1 packet Juven BID, each packet provides 95 calories, 2.5 grams of protein (collagen), and 9.8 grams of carbohydrate (3 grams sugar); also contains 7 grams of L-arginine and L-glutamine, 300 mg vitamin C, 15 mg vitamin E, 1.2 mcg vitamin B-12, 9.5 mg zinc, 200 mg calcium, and 1.5 g  Calcium Beta-hydroxy-Beta-methylbutyrate to support wound healing   NUTRITION DIAGNOSIS:   Moderate Malnutrition related to chronic illness as evidenced by mild fat depletion, moderate fat depletion, mild muscle depletion, moderate muscle depletion.  Ongoing  GOAL:   Patient will meet greater than or equal to 90% of their needs  Met with TF  MONITOR:   Diet advancement, Labs, Weight trends, TF tolerance, I & O's, Skin  REASON FOR ASSESSMENT:   Consult Enteral/tube feeding initiation and management  ASSESSMENT:   63 y/o female with h/o seizures, dysphagia secondary to achalasia with esophageal dilation and numerous food impactions, CVA (2009), depression, DM and compression fracture who is admitted with AMS, AKI, GIB and sepsis.  1/8- s/p EGD- pt noted to have tortuous esophagus, esophagitis with dilation & erythematous duodenopathy  1/15- s/p EGD- pt noted to have tortuous esophagus, esophagitis with dilation, duodenitis & oozing duodenal ulcer  1/16- s/p rt thoracentesis (600 ml removed) 1/17- s/p BSE- continue feeding via alternative means 1/21- s/p PEG placement   Reviewed I/O's: -1 L x 24 hours and -998 ml since 09/09/23  UOP: 1 L x 24 hours  Pt sleeping soundly at  time of visit. No family present.   Pt NPO and receiving TV via PEG for sole source nutrition. Osmolite 1.5 infusing at goal rate of 55 ml/hr. Pt tolerating TF well.  Wt has been stable over the apst week.   Medications reviewed and include vitamin B-12  Per TOC notes, plan for SNF placement once medically stable.   Labs reviewed: CBGS: 86-167 (inpatient orders for glycemic control are 0-9 units insulin aspart every 6 hours).    Diet Order:   Diet Order             Diet NPO time specified  Diet effective midnight                   EDUCATION NEEDS:   No education needs have been identified at this time  Skin:  Skin Assessment: Skin Integrity Issues: Skin Integrity Issues:: Stage II Stage II: lt buttocks, medial buttocks  Last BM:  09/22/23 (300 ml via rectal tube)  Height:   Ht Readings from Last 1 Encounters:  09/04/23 5\' 6"  (1.676 m)    Weight:   Wt Readings from Last 1 Encounters:  09/23/23 77.9 kg    Ideal Body Weight:  59 kg  BMI:  Body mass index is 27.72 kg/m.  Estimated Nutritional Needs:   Kcal:  1700-1900kcal/day  Protein:  85-95g/day  Fluid:  1.8-2.1L/day    Levada Schilling, RD, LDN, CDCES Registered Dietitian III Certified Diabetes Care and Education Specialist If unable to reach this RD, please use "RD Inpatient" group chat on secure chat  between hours of 8am-4 pm daily

## 2023-09-23 NOTE — Progress Notes (Signed)
Palliative Care Progress Note, Assessment & Plan   Patient Name: Kaitlyn Hodges       Date: 09/23/2023 DOB: 28-Jan-1961  Age: 63 y.o. MRN#: [id number] Attending Physician: Charise Killian, MD Primary Care Physician: Malva Limes, MD Admit Date: 09/04/2023  Subjective: Is lying in bed, awake, and alert.  She is oriented to self and place.  She acknowledges my presence and is able to make her wishes known.  No family or friends present during my visit.  HPI: 63 y.o. female  with past medical history of CVA (2010), seizure disorder, prediabetes, type I achalasia, tobacco use, and erosive esophagitis admitted on 09/04/2023 with AMS.   As per chart review, daughter endorsed cough and shortness of breath for about a month PTA.  Daughter also endorsed nausea with vomiting and poor oral intake for approximately 1 week PTA.   Course of hospitalization has revealed a pan scan notable for possible left parotiditis, central airway thickening, and patchy airspace opacities in the bases with an age indeterminate T3 fracture.   Patient's respiratory panel was negative.  She is being treated for community-acquired ammonia with acute hypoxic respiratory failure, bilateral pleural effusions with compressive atelectasis, hemorrhagic shock secondary to acute GI bleed with acute blood loss anemia, AKI, thrombocytopenia disease hematology consulted), and acute metabolic encephalopathy.   MRI of brain revealed dural thickening versus thin subdural collection overlying the left cerebral hemisphere, measuring up to 3-4 mm in thickness.  Neurosurgery reviewed imaging and recommended no indication for surgery at this time.   EEG revealed moderate diffuse encephalopathy with no seizures or epileptiform discharges noted.   CT of head  revealed chronic infarcts but no acute findings.   1/21, peg tube placed without complication.    PMT was consulted to discuss boundaries and goals of care.  Summary of counseling/coordination of care: Extensive chart review completed prior to meeting patient including labs, vital signs, imaging, progress notes, orders, and available advanced directive documents from current and previous encounters.   After reviewing the patient's chart and assessing the patient at bedside, I spoke with patient in regards to symptom management and goals of care.   Dems assessed.  Patient continues to endorse intermittent left lower quadrant abdominal pain.  Physical assessment does not reveal rebound tenderness, bloating, or abnormalities.  She shares that it does not hurt all the time but that it aches intermittently.  Discussed use of pain medication such as Tylenol to relieve patient's discomfort.  Me a thumbs up in agreement to take Tylenol to see if this eases some of her pain.  RN notified and requested 1 dose of as needed Tylenol to be given.  No adjustment to [facility] needed at this time.  I discussed boundaries and goals of care with patient.  She remains in agreement with full code and full scope of care.  She is accepting of all offered, available, and appropriate medical interventions to sustain her life.  She continues to endorse that her children are her next of kin decision makers jointly.  Low symptom burden.  Goals clear.  Patient/family have PMT contact information and were encouraged to contact PMT with any future acute palliative needs.   PMT will  monitor the patient peripherally and shadow her chart. Please re-engage with PMT if goals change, at patient/family's request, or if patient's health deteriorates during hospitalization.    Physical Exam Vitals reviewed.  Constitutional:      General: She is not in acute distress.    Appearance: She is obese.  HENT:     Head: Normocephalic.      Mouth/Throat:     Mouth: Mucous membranes are moist.  Eyes:     Pupils: Pupils are equal, round, and reactive to light.  Pulmonary:     Effort: Pulmonary effort is normal.  Abdominal:     Palpations: Abdomen is soft.  Skin:    General: Skin is warm and dry.     Comments: PEG in mid abdomen - dressing clean, dry intact, no erythema noted  Neurological:     Mental Status: She is alert.     Comments: Oriented to self and place  Psychiatric:        Behavior: Behavior normal.        Thought Content: Thought content normal.        Judgment: Judgment normal.             Total Time 35 minutes   Time spent includes: Detailed review of medical records (labs, imaging, vital signs), medically appropriate exam (mental status, respiratory, cardiac, skin), discussed with treatment team, counseling and educating patient, family and staff, documenting clinical information, medication management and coordination of care.  Samara Deist L. Bonita Quin, DNP, FNP-BC Palliative Medicine Team

## 2023-09-24 DIAGNOSIS — R1311 Dysphagia, oral phase: Secondary | ICD-10-CM | POA: Diagnosis not present

## 2023-09-24 LAB — CBC
HCT: 21.6 % — ABNORMAL LOW (ref 36.0–46.0)
Hemoglobin: 6.9 g/dL — ABNORMAL LOW (ref 12.0–15.0)
MCH: 31.5 pg (ref 26.0–34.0)
MCHC: 31.9 g/dL (ref 30.0–36.0)
MCV: 98.6 fL (ref 80.0–100.0)
Platelets: 293 10*3/uL (ref 150–400)
RBC: 2.19 MIL/uL — ABNORMAL LOW (ref 3.87–5.11)
RDW: 16 % — ABNORMAL HIGH (ref 11.5–15.5)
WBC: 8 10*3/uL (ref 4.0–10.5)
nRBC: 0 % (ref 0.0–0.2)

## 2023-09-24 LAB — BASIC METABOLIC PANEL
Anion gap: 6 (ref 5–15)
BUN: 40 mg/dL — ABNORMAL HIGH (ref 8–23)
CO2: 22 mmol/L (ref 22–32)
Calcium: 7.9 mg/dL — ABNORMAL LOW (ref 8.9–10.3)
Chloride: 113 mmol/L — ABNORMAL HIGH (ref 98–111)
Creatinine, Ser: 0.71 mg/dL (ref 0.44–1.00)
GFR, Estimated: >60 mL/min (ref >60)
Glucose, Bld: 128 mg/dL — ABNORMAL HIGH (ref 70–99)
Potassium: 4.7 mmol/L (ref 3.5–5.1)
Sodium: 141 mmol/L (ref 135–145)

## 2023-09-24 LAB — GLUCOSE, CAPILLARY
Glucose-Capillary: 110 mg/dL — ABNORMAL HIGH (ref 70–99)
Glucose-Capillary: 120 mg/dL — ABNORMAL HIGH (ref 70–99)
Glucose-Capillary: 123 mg/dL — ABNORMAL HIGH (ref 70–99)
Glucose-Capillary: 147 mg/dL — ABNORMAL HIGH (ref 70–99)
Glucose-Capillary: 84 mg/dL (ref 70–99)
Glucose-Capillary: 95 mg/dL (ref 70–99)

## 2023-09-24 LAB — HEMOGLOBIN AND HEMATOCRIT, BLOOD
HCT: 27 % — ABNORMAL LOW (ref 36.0–46.0)
Hemoglobin: 8.8 g/dL — ABNORMAL LOW (ref 12.0–15.0)

## 2023-09-24 LAB — PREPARE RBC (CROSSMATCH)

## 2023-09-24 MED ORDER — SODIUM CHLORIDE 0.9% IV SOLUTION: Freq: Once | INTRAVENOUS | Status: AC

## 2023-09-24 NOTE — Plan of Care (Signed)

## 2023-09-24 NOTE — Progress Notes (Signed)
Physical Therapy Treatment Patient Details Name: Kaitlyn Hodges MRN: [id number] DOB: Jan 08, 1961 Today's Date: 09/24/2023   History of Present Illness Terita Telfair is a 63 y.o. female with medical history significant of prediabetes, CVA, seizure disorder, type I achalasia,tobacco use disorder, erosive esophagitis who presents to the ED due to altered mental status. Patient's daughter states that over the last 1 month, patient has been experiencing significant shortness of breath and cough that has progressively worsened.  Then for the last 1 week, she has been experiencing persistent nausea, vomiting and poor p.o. intake.    PT Comments  Patient supine in bed on arrival and seems more awake this date and verbally responsive, however oriented to name only. Seems puzzled when birthday is provided to her. Follows commands inconsistently and requires hand over hand assist to place hands on bed rail to assist with bed mobility. Requires maxA to complete bed mobility and min-modA to maintain sitting balance along with max cueing to maintain cervical extension while sitting EOB. Deferred OOB attempts this date 2/2 fatigue and poor sitting balance. Discharge plan remains appropriate.    If plan is discharge home, recommend the following: Two people to help with walking and/or transfers;Two people to help with bathing/dressing/bathroom   Can travel by private vehicle     No  Equipment Recommendations  Other (comment) (TBD)    Recommendations for Other Services       Precautions / Restrictions Precautions Precautions: Fall Precaution Comments: PEG Restrictions Weight Bearing Restrictions Per Provider Order: No     Mobility  Bed Mobility Overal bed mobility: Needs Assistance Bed Mobility: Supine to Sit, Sit to Supine     Supine to sit: Max assist Sit to supine: Max assist   General bed mobility comments: requires maxA and max cueing and encouragement to come to EOB. Hand over hand assist for  hand placement on bed rail    Transfers                   General transfer comment: deferred due to poor sitting balance    Ambulation/Gait                   Stairs             Wheelchair Mobility     Tilt Bed    Modified Rankin (Stroke Patients Only)       Balance Overall balance assessment: Needs assistance Sitting-balance support: Feet supported, Bilateral upper extremity supported Sitting balance-Leahy Scale: Poor Sitting balance - Comments: min-modA to maintain sitting balance with frequent cues to lift head                                    Cognition Arousal: Alert Behavior During Therapy: Flat affect, Anxious Overall Cognitive Status: Impaired/Different from baseline Area of Impairment: Orientation, Following commands, Safety/judgement, Problem solving                 Orientation Level: Disoriented to, Place, Time, Situation (disoriented to birthdate) Current Attention Level: Focused   Following Commands: Follows one step commands inconsistently, Follows one step commands with increased time Safety/Judgement: Decreased awareness of safety, Decreased awareness of deficits Awareness: Intellectual Problem Solving: Slow processing, Decreased initiation, Difficulty sequencing, Requires verbal cues, Requires tactile cues General Comments: improved verbal responses, however oriented to name only. Unable to state her birthday and seems puzzled when given birthday. Follows commands inconsistently  Exercises      General Comments        Pertinent Vitals/Pain Pain Assessment Pain Assessment: Faces Faces Pain Scale: Hurts even more Pain Location: abdomen - PEG site Pain Descriptors / Indicators: Discomfort, Grimacing Pain Intervention(s): Limited activity within patient's tolerance, Monitored during session, Repositioned    Home Living                          Prior Function            PT  Goals (current goals can now be found in the care plan section) Acute Rehab PT Goals Patient Stated Goal: did not state PT Goal Formulation: With family Time For Goal Achievement: 10/01/23 Potential to Achieve Goals: Fair Progress towards PT goals: Progressing toward goals    Frequency    Min 1X/week      PT Plan      Co-evaluation              AM-PAC PT "6 Clicks" Mobility   Outcome Measure  Help needed turning from your back to your side while in a flat bed without using bedrails?: A Lot Help needed moving from lying on your back to sitting on the side of a flat bed without using bedrails?: Total Help needed moving to and from a bed to a chair (including a wheelchair)?: Total Help needed standing up from a chair using your arms (e.g., wheelchair or bedside chair)?: Total Help needed to walk in hospital room?: Total Help needed climbing 3-5 steps with a railing? : Total 6 Click Score: 7    End of Session   Activity Tolerance: Patient limited by fatigue Patient left: in bed;with call bell/phone within reach;with bed alarm set Nurse Communication: Mobility status PT Visit Diagnosis: Muscle weakness (generalized) (M62.81);Other abnormalities of gait and mobility (R26.89);Difficulty in walking, not elsewhere classified (R26.2)     Time: 1610-9604 PT Time Calculation (min) (ACUTE ONLY): 19 min  Charges:    $Therapeutic Activity: 8-22 mins PT General Charges $$ ACUTE PT VISIT: 1 Visit                     Maylon Peppers, PT, DPT Physical Therapist - [facility]  [facility]    Windsor Goeken A Leia Coletti 09/24/2023, 3:33 PM

## 2023-09-24 NOTE — Progress Notes (Signed)
PROGRESS NOTE    Kaitlyn Hodges  ZOX:[id number] DOB: August 05, 1961 DOA: 09/04/2023 PCP: Malva Limes, MD    Assessment & Plan:   Principal Problem:   Severe sepsis [facility]) Active Problems:   Acute renal failure (HCC)   Acute encephalopathy   Achalasia   Pre-diabetes   Seizure disorder (HCC)   History of CVA (cerebrovascular accident)   Closed wedge compression fracture of T3 vertebra (HCC)   Melena   Hemorrhagic shock (HCC)   Gastrointestinal hemorrhage   Lactic acidosis   Pressure injury of skin   Malnutrition of moderate degree   Upper GI bleed   Thrombocytopenia (HCC)   Hypophosphatemia   Acute hypoxic respiratory failure (HCC)   Duodenitis   Oropharyngeal dysphagia   Oral phase dysphagia  Assessment and Plan: Severe sepsis: see Dr. Helayne Seminole notes on how pt met severe sepsis criteria. Resolved  CAP: completed abx course on 09/13/23.   Acute hypoxic respiratory failure: weaned off of supplemental oxygen. Resolved.    B/l pleural effusions: s/p right-sided thoracentesis with removal of 600 mL of pleural fluid on 09/16/2023.  Repeat chest x-ray shows improvement in pleural effusions.   Hemorrhagic shock: secondary to acute GI bleed, gastritis, duodenitis, oozing duodenal ulcer with visible vessel. S/p EGD on 09/08/2023, showed tortuous esophagus, diffuse inflammation in the esophagus and the small bowel which is not amenable to any endoscopic procedure. Repeat EGD on 09/15/2023 showed tortuous esophagus, dilation in the entire esophagus, gastritis, duodenitis, oozing duodenal ulcer with a visible vessel.  Clips were placed.  She treated with argon plasma coagulation. Continue on PPI   Acute blood loss anemia: secondary to hemorrhagic shock. S/p 1 unit of pRBCs transfused so far. Will transfuse 1 unit more of pRBCs as Hb 6.9 today. Repeat H&H ordered    Thrombocytopenia: resolved. S/p 5 units of platelets transfused this admission. 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 MRI brain done 1/9 showed dural thickening versus thin subdural collection overlying the left cerebral hemisphere, measuring up to 3-4 mm in thickness. Neurosurgery reviewed imaging.  No indication for surgery. EEG showed moderate diffuse encephalopathy. No seizures or epileptiform discharges. Continue w/ supportive care    Pre-diabetes: continue on SSI w/ accuchecks     Closed wedge compression fracture of T3 vertebra: incidental finding of a T3 compression fracture on imaging. S/p fall prior to admission. Tylenol prn for pain   Hx of CVA: not on aspirin or statin    Seizure disorder: hx of seizure disorder since CVA in 2010.  No reported seizures since that time. EEG showed moderate diffuse encephalopathy. Not on any seizures meds at home    Dysphagia: likely secondary to achalasia. Needs surgery as per pt's surgery but has yet to get this done.  She was reevaluated by speech therapist.  Unfortunately, patient can't have a safe diet at this point. S/p PEG tube placement on 09/21/2023. Continue w/ tube feeds   General weakness: PT/OT recs SNF. Still needs SNF placement       DVT prophylaxis: SCDs Code Status: full  Family Communication:  Disposition Plan: needs SNF placement  Level of care: Med-Surg  Status is: Inpatient Remains inpatient appropriate because: needs SNF placement      Consultants:    Procedures:   Antimicrobials:    Subjective: Pt c/o  fatigue   Objective: Vitals:   09/23/23 2140 09/24/23 0457 09/24/23 0500 09/24/23 0827  BP: 119/77 119/74  114/69  Pulse: 97 92  92  Resp: 20 16  17   Temp: 97.6 F (36.4 C) 98.5 F (36.9 C)  98.6 F (37 C)  TempSrc: Oral Oral  Oral  SpO2: 100% 99%  100%  Weight:   76.1 kg   Height:        Intake/Output Summary (Last 24 hours) at  09/24/2023 0903 Last data filed at 09/24/2023 0837 Gross per 24 hour  Intake 5618.25 ml  Output 850 ml  Net 4768.25 ml   Filed Weights   09/22/23 0358 09/23/23 0500 09/24/23 0500  Weight: 76 kg 77.9 kg 76.1 kg    Examination:  General exam: appears comfortable Respiratory system: decreased breath sounds b/l  Cardiovascular system: S1/S2+. No rubs or clicks  Gastrointestinal system: abd is soft, NT, ND & hypoactive bowel sounds  Central nervous system: alert & awake. Moves all extremities  Psychiatry: Judgement and insight appears poor.  Flat mood and affect    Data Reviewed: I have personally reviewed following labs and imaging studies  CBC: Recent Labs  Lab 09/18/23 0525 09/21/23 0426 09/22/23 0709 09/23/23 0639 09/24/23 0648  WBC 6.4 6.3 7.1 8.5 8.0  NEUTROABS  --  3.9  --   --   --   HGB 8.1* 7.3* 7.3* 7.4* 6.9*  HCT 24.8* 22.8* 23.6* 23.2* 21.6*  MCV 94.3 97.4 99.6 97.5 98.6  PLT 155 208 262 270 293   Basic Metabolic Panel: Recent Labs  Lab 09/19/23 0527 09/21/23 0426 09/22/23 0709 09/23/23 0639 09/24/23 0648  NA 148* 145 145 144 141  K 4.7 4.9 4.8 4.7 4.7  CL 118* 119* 116* 115* 113*  CO2 25 22 22 22 22   GLUCOSE 225* 197* 133* 159* 128*  BUN 57* 52* 38* 43* 40*  CREATININE 0.79 0.75 0.79 0.75 0.71  CALCIUM 7.9* 7.8* 8.0* 8.0* 7.9*   GFR: Estimated Creatinine Clearance: 75 mL/min (by C-G formula based on SCr of 0.71 mg/dL). Liver Function Tests: No results for input(s): "AST", "ALT", "ALKPHOS", "BILITOT", "PROT", "ALBUMIN" in the last 168 hours.  No results for input(s): "LIPASE", "AMYLASE" in the last 168 hours. No results for input(s): "AMMONIA" in the last 168 hours. Coagulation Profile: No results for input(s): "INR", "PROTIME" in the last 168 hours. Cardiac Enzymes: No results for input(s): "CKTOTAL", "CKMB", "CKMBINDEX", "TROPONINI" in the last 168 hours. BNP (last 3 results) No results for input(s): "PROBNP" in the last 8760  hours. HbA1C: No results for input(s): "HGBA1C" in the last 72 hours. CBG: Recent Labs  Lab 09/23/23 1130 09/23/23 1724 09/23/23 2336 09/24/23 0508 09/24/23 0817  GLUCAP 151* 125* 130* 110* 120*   Lipid Profile: No results for input(s): "CHOL", "HDL", "LDLCALC", "TRIG", "CHOLHDL", "LDLDIRECT" in the last 72 hours. Thyroid Function Tests: No results for input(s): "TSH", "T4TOTAL", "FREET4", "T3FREE", "THYROIDAB" in the last 72 hours. Anemia Panel: No results for input(s): "VITAMINB12", "FOLATE", "FERRITIN", "TIBC", "IRON", "RETICCTPCT" in the last 72 hours. Sepsis Labs: No results for input(s): "PROCALCITON", "LATICACIDVEN" in the last 168 hours.  Recent Results (from the past 240 hours)  Body fluid culture w Gram Stain     Status: None   Collection Time: 09/16/23  2:20 PM   Specimen: PATH Cytology Pleural fluid  Result Value Ref Range Status   Specimen Description   Final    FLUID Performed at [facility], [address]  [place], [place] [address]    Special Requests   Final    CYTO PLEU Performed at [facility], [address] [place], [place] [address]    Gram Stain   Final    RARE WBC PRESENT, PREDOMINANTLY MONONUCLEAR NO ORGANISMS SEEN    Culture   Final    NO GROWTH 3 DAYS Performed at [facility], [address], [place], [place] [address]    Report Status 09/20/2023 FINAL  Final         Radiology Studies: No results found.      Scheduled Meds:  sodium chloride   Intravenous Once   Chlorhexidine Gluconate Cloth  6 each Topical Daily   vitamin B-12  1,000 mcg Per Tube Daily   free water  200 mL Per Tube Q6H   insulin aspart  0-9 Units Subcutaneous Q6H   nutrition supplement (JUVEN)  1 packet Per Tube BID BM   mouth rinse  15 mL Mouth Rinse 4 times per day   pantoprazole (PROTONIX) IV  40 mg Intravenous Q12H   sodium chloride flush  3 mL Intravenous Q12H   Continuous Infusions:  feeding supplement (OSMOLITE 1.5  CAL) 1,000 mL (09/23/23 1814)     LOS: 20 days      Charise Killian, MD [facility] Pager [phone]  If 7PM-7AM, please contact night-coverage [email or web address] 09/24/2023, 9:03 AM

## 2023-09-25 DIAGNOSIS — R1311 Dysphagia, oral phase: Secondary | ICD-10-CM | POA: Diagnosis not present

## 2023-09-25 LAB — BASIC METABOLIC PANEL
Anion gap: 8 (ref 5–15)
BUN: 40 mg/dL — ABNORMAL HIGH (ref 8–23)
CO2: 21 mmol/L — ABNORMAL LOW (ref 22–32)
Calcium: 8.1 mg/dL — ABNORMAL LOW (ref 8.9–10.3)
Chloride: 108 mmol/L (ref 98–111)
Creatinine, Ser: 0.63 mg/dL (ref 0.44–1.00)
GFR, Estimated: >60 mL/min (ref >60)
Glucose, Bld: 125 mg/dL — ABNORMAL HIGH (ref 70–99)
Potassium: 4.6 mmol/L (ref 3.5–5.1)
Sodium: 137 mmol/L (ref 135–145)

## 2023-09-25 LAB — TYPE AND SCREEN
ABO/RH(D): A POS
Antibody Screen: NEGATIVE
Unit division: 0

## 2023-09-25 LAB — CBC
HCT: 25.9 % — ABNORMAL LOW (ref 36.0–46.0)
Hemoglobin: 8.5 g/dL — ABNORMAL LOW (ref 12.0–15.0)
MCH: 31.5 pg (ref 26.0–34.0)
MCHC: 32.8 g/dL (ref 30.0–36.0)
MCV: 95.9 fL (ref 80.0–100.0)
Platelets: 298 10*3/uL (ref 150–400)
RBC: 2.7 MIL/uL — ABNORMAL LOW (ref 3.87–5.11)
RDW: 16.2 % — ABNORMAL HIGH (ref 11.5–15.5)
WBC: 8.3 10*3/uL (ref 4.0–10.5)
nRBC: 0 % (ref 0.0–0.2)

## 2023-09-25 LAB — BPAM RBC
Blood Product Expiration Date: 202502182359
ISSUE DATE / TIME: 202501241128
Unit Type and Rh: 6200

## 2023-09-25 LAB — GLUCOSE, CAPILLARY
Glucose-Capillary: 117 mg/dL — ABNORMAL HIGH (ref 70–99)
Glucose-Capillary: 134 mg/dL — ABNORMAL HIGH (ref 70–99)
Glucose-Capillary: 134 mg/dL — ABNORMAL HIGH (ref 70–99)

## 2023-09-25 NOTE — Progress Notes (Signed)
PROGRESS NOTE    Kaitlyn Hodges  ZOX:[id number] DOB: [birth year] DOA: 09/04/2023 PCP: Malva Limes, MD    Assessment & Plan:   Principal Problem:   Severe sepsis [facility]) Active Problems:   Acute renal failure (HCC)   Acute encephalopathy   Achalasia   Pre-diabetes   Seizure disorder (HCC)   History of CVA (cerebrovascular accident)   Closed wedge compression fracture of T3 vertebra (HCC)   Melena   Hemorrhagic shock (HCC)   Gastrointestinal hemorrhage   Lactic acidosis   Pressure injury of skin   Malnutrition of moderate degree   Upper GI bleed   Thrombocytopenia (HCC)   Hypophosphatemia   Acute hypoxic respiratory failure (HCC)   Duodenitis   Oropharyngeal dysphagia   Oral phase dysphagia  Assessment and Plan: Severe sepsis: see Dr. Helayne Seminole notes on how pt met severe sepsis criteria. Resolved  CAP: completed abx course on 09/13/23.   Acute hypoxic respiratory failure: weaned off of supplemental oxygen. Resolved.    B/l pleural effusions: s/p right-sided thoracentesis with removal of 600 mL of pleural fluid on 09/16/2023.  Repeat chest x-ray shows improvement in pleural effusions.   Hemorrhagic shock: secondary to acute GI bleed, gastritis, duodenitis, oozing duodenal ulcer with visible vessel. S/p EGD on 09/08/2023, showed tortuous esophagus, diffuse inflammation in the esophagus and the small bowel which is not amenable to any endoscopic procedure. Repeat EGD on 09/15/2023 showed tortuous esophagus, dilation in the entire esophagus, gastritis, duodenitis, oozing duodenal ulcer with a visible vessel.  Clips were placed.  She treated with argon plasma coagulation. Continue on PPI   Acute blood loss anemia: secondary to hemorrhagic shock. S/p 3 units of pRBCs transfused so far. Will continue to monitor H&H    Thrombocytopenia: resolved. S/p 5 units of platelets transfused this admission. 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 MRI brain done 1/9 showed dural thickening versus thin subdural collection overlying the left cerebral hemisphere, measuring up to 3-4 mm in thickness. Neurosurgery reviewed imaging.  No indication for surgery. EEG showed moderate diffuse encephalopathy. No seizures or epileptiform discharges. Continue w/ supportive care     Pre-diabetes: continue on SSI w/ accuchecks    Closed wedge compression fracture of T3 vertebra: incidental finding of a T3 compression fracture on imaging. S/p fall prior to admission. Tylenol prn for pain   Hx of CVA: not on statin or aspirin    Seizure disorder: hx of seizure disorder since CVA in 2010.  No reported seizures since that time. EEG showed moderate diffuse encephalopathy. Not on any seizures meds at home    Dysphagia: likely secondary to achalasia. Needs surgery as per pt's surgery but has yet to get this done.  She was reevaluated by speech therapist.  Unfortunately, patient can't have a safe diet at this point. S/p PEG tube placement on 09/21/2023. Continue w/ tube feeds   General weakness: PT/OT recs SNF. Still needs SNF placement       DVT prophylaxis: SCDs Code Status: full  Family Communication: called pt's son, Iantha Fallen, no answer so I left a voicemail  Disposition Plan: needs SNF placement  Level of care: Med-Surg  Status is: Inpatient Remains inpatient appropriate because: medically stable. needs SNF placement      Consultants:    Procedures:   Antimicrobials:  Subjective: Pt c/o malaise.    Objective: Vitals:   09/24/23 1349 09/24/23 2055 09/25/23 0500 09/25/23 0826  BP: 117/72 112/82  112/80  Pulse: 80 94  79  Resp: 16 16  18   Temp: 98.8 F (37.1 C) 98.2 F (36.8 C)  98.1 F (36.7 C)  TempSrc: Oral Oral    SpO2: 100% 97%  100%  Weight:   78.2 kg   Height:         Intake/Output Summary (Last 24 hours) at 09/25/2023 0835 Last data filed at 09/25/2023 0500 Gross per 24 hour  Intake 268 ml  Output 1650 ml  Net -1382 ml   Filed Weights   09/23/23 0500 09/24/23 0500 09/25/23 0500  Weight: 77.9 kg 76.1 kg 78.2 kg    Examination:  General exam: appears comfortable  Respiratory system: diminished breath sounds b/l  Cardiovascular system: S1 & S2+. No rubs or clicks  Gastrointestinal system: abd is soft, NT, ND, hyperactive bowel sounds Central nervous system: alert & awake. Moves all extremities Psychiatry: Judgement and insight appears poor. Flat mood and affect    Data Reviewed: I have personally reviewed following labs and imaging studies  CBC: Recent Labs  Lab 09/21/23 0426 09/22/23 0709 09/23/23 0639 09/24/23 0648 09/24/23 1912 09/25/23 0516  WBC 6.3 7.1 8.5 8.0  --  8.3  NEUTROABS 3.9  --   --   --   --   --   HGB 7.3* 7.3* 7.4* 6.9* 8.8* 8.5*  HCT 22.8* 23.6* 23.2* 21.6* 27.0* 25.9*  MCV 97.4 99.6 97.5 98.6  --  95.9  PLT 208 262 270 293  --  298   Basic Metabolic Panel: Recent Labs  Lab 09/21/23 0426 09/22/23 0709 09/23/23 0639 09/24/23 0648 09/25/23 0516  NA 145 145 144 141 137  K 4.9 4.8 4.7 4.7 4.6  CL 119* 116* 115* 113* 108  CO2 22 22 22 22  21*  GLUCOSE 197* 133* 159* 128* 125*  BUN 52* 38* 43* 40* 40*  CREATININE 0.75 0.79 0.75 0.71 0.63  CALCIUM 7.8* 8.0* 8.0* 7.9* 8.1*   GFR: Estimated Creatinine Clearance: 76 mL/min (by C-G formula based on SCr of 0.63 mg/dL). Liver Function Tests: No results for input(s): "AST", "ALT", "ALKPHOS", "BILITOT", "PROT", "ALBUMIN" in the last 168 hours.  No results for input(s): "LIPASE", "AMYLASE" in the last 168 hours. No results for input(s): "AMMONIA" in the last 168 hours. Coagulation Profile: No results for input(s): "INR", "PROTIME" in the last 168 hours. Cardiac Enzymes: No results for input(s): "CKTOTAL", "CKMB", "CKMBINDEX", "TROPONINI" in the last 168  hours. BNP (last 3 results) No results for input(s): "PROBNP" in the last 8760 hours. HbA1C: No results for input(s): "HGBA1C" in the last 72 hours. CBG: Recent Labs  Lab 09/24/23 1203 09/24/23 1711 09/24/23 1821 09/24/23 2340 09/25/23 0553  GLUCAP 147* 95 84 123* 117*   Lipid Profile: No results for input(s): "CHOL", "HDL", "LDLCALC", "TRIG", "CHOLHDL", "LDLDIRECT" in the last 72 hours. Thyroid Function Tests: No results for input(s): "TSH", "T4TOTAL", "FREET4", "T3FREE", "THYROIDAB" in the last 72 hours. Anemia Panel: No results for input(s): "VITAMINB12", "FOLATE", "FERRITIN", "TIBC", "IRON", "RETICCTPCT" in the last 72 hours. Sepsis Labs: No results for input(s): "PROCALCITON", "LATICACIDVEN" in the last 168 hours.  Recent Results (from the past 240 hours)  Body fluid culture w Gram Stain     Status: None   Collection Time: 09/16/23  2:20 PM   Specimen: PATH Cytology Pleural fluid  Result Value Ref Range Status  Specimen Description   Final    FLUID Performed at [facility], [address] [place] [place], [place] [address]    Special Requests   Final    CYTO PLEU Performed at [facility], [address] [place], [place] [address]    Gram Stain   Final    RARE WBC PRESENT, PREDOMINANTLY MONONUCLEAR NO ORGANISMS SEEN    Culture   Final    NO GROWTH 3 DAYS Performed at [facility], [address], [place], [place] [address]    Report Status 09/20/2023 FINAL  Final         Radiology Studies: No results found.      Scheduled Meds:  Chlorhexidine Gluconate Cloth  6 each Topical Daily   vitamin B-12  1,000 mcg Per Tube Daily   free water  200 mL Per Tube Q6H   insulin aspart  0-9 Units Subcutaneous Q6H   nutrition supplement (JUVEN)  1 packet Per Tube BID BM   mouth rinse  15 mL Mouth Rinse 4 times per day   pantoprazole (PROTONIX) IV  40 mg Intravenous Q12H   sodium chloride flush  3 mL Intravenous Q12H   Continuous  Infusions:  feeding supplement (OSMOLITE 1.5 CAL) 1,000 mL (09/24/23 1401)     LOS: 21 days      Charise Killian, MD [facility] Pager [phone]  If 7PM-7AM, please contact night-coverage [email or web address] 09/25/2023, 8:35 AM

## 2023-09-25 NOTE — Plan of Care (Signed)
  Problem: Clinical Measurements: Goal: Diagnostic test results will improve Outcome: Progressing Goal: Signs and symptoms of infection will decrease Outcome: Progressing   Problem: Respiratory: Goal: Ability to maintain adequate ventilation will improve Outcome: Progressing   Problem: Clinical Measurements: Goal: Ability to maintain clinical measurements within normal limits will improve Outcome: Progressing Goal: Will remain free from infection Outcome: Progressing Goal: Diagnostic test results will improve Outcome: Progressing Goal: Respiratory complications will improve Outcome: Progressing Goal: Cardiovascular complication will be avoided Outcome: Progressing   Problem: Nutrition: Goal: Adequate nutrition will be maintained Outcome: Progressing   Problem: Pain Management: Goal: General experience of comfort will improve Outcome: Progressing   Problem: Safety: Goal: Ability to remain free from injury will improve Outcome: Progressing

## 2023-09-25 NOTE — Progress Notes (Signed)
Patient pulled both IV's out. States she is unsure of how that happened. Pt's rectal tube also leaked and was repositioned and flushed. Liquid stool noted in tubing. All liquid stool noted to the bed pads. Full bed and linen change, and bath performed for patient. Pt is able to help turn but cannot hold herself over. Pt is alert and oriented x2. Can demonstrate at this time the use of the call bell and verbalizes understanding to call for assistance if she has a stool again. Purewick also changed

## 2023-09-26 DIAGNOSIS — R1311 Dysphagia, oral phase: Secondary | ICD-10-CM | POA: Diagnosis not present

## 2023-09-26 LAB — BASIC METABOLIC PANEL
Anion gap: 7 (ref 5–15)
BUN: 28 mg/dL — ABNORMAL HIGH (ref 8–23)
CO2: 23 mmol/L (ref 22–32)
Calcium: 8 mg/dL — ABNORMAL LOW (ref 8.9–10.3)
Chloride: 105 mmol/L (ref 98–111)
Creatinine, Ser: 0.65 mg/dL (ref 0.44–1.00)
GFR, Estimated: >60 mL/min (ref >60)
Glucose, Bld: 125 mg/dL — ABNORMAL HIGH (ref 70–99)
Potassium: 4.2 mmol/L (ref 3.5–5.1)
Sodium: 135 mmol/L (ref 135–145)

## 2023-09-26 LAB — GLUCOSE, CAPILLARY
Glucose-Capillary: 109 mg/dL — ABNORMAL HIGH (ref 70–99)
Glucose-Capillary: 121 mg/dL — ABNORMAL HIGH (ref 70–99)
Glucose-Capillary: 141 mg/dL — ABNORMAL HIGH (ref 70–99)
Glucose-Capillary: 147 mg/dL — ABNORMAL HIGH (ref 70–99)
Glucose-Capillary: 92 mg/dL (ref 70–99)

## 2023-09-26 LAB — CBC
HCT: 25.8 % — ABNORMAL LOW (ref 36.0–46.0)
Hemoglobin: 8.6 g/dL — ABNORMAL LOW (ref 12.0–15.0)
MCH: 31.2 pg (ref 26.0–34.0)
MCHC: 33.3 g/dL (ref 30.0–36.0)
MCV: 93.5 fL (ref 80.0–100.0)
Platelets: 302 10*3/uL (ref 150–400)
RBC: 2.76 MIL/uL — ABNORMAL LOW (ref 3.87–5.11)
RDW: 15.8 % — ABNORMAL HIGH (ref 11.5–15.5)
WBC: 8.4 10*3/uL (ref 4.0–10.5)
nRBC: 0 % (ref 0.0–0.2)

## 2023-09-26 NOTE — Progress Notes (Signed)
PROGRESS NOTE    Kaitlyn Hodges  EAV:[id number] DOB: Dec 13, 1960 DOA: 09/04/2023 PCP: Malva Limes, MD    Assessment & Plan:   Principal Problem:   Severe sepsis [facility]) Active Problems:   Acute renal failure (HCC)   Acute encephalopathy   Achalasia   Pre-diabetes   Seizure disorder (HCC)   History of CVA (cerebrovascular accident)   Closed wedge compression fracture of T3 vertebra (HCC)   Melena   Hemorrhagic shock (HCC)   Gastrointestinal hemorrhage   Lactic acidosis   Pressure injury of skin   Malnutrition of moderate degree   Upper GI bleed   Thrombocytopenia (HCC)   Hypophosphatemia   Acute hypoxic respiratory failure (HCC)   Duodenitis   Oropharyngeal dysphagia   Oral phase dysphagia  Assessment and Plan: Severe sepsis: see Dr. Helayne Seminole notes on how pt met severe sepsis criteria. Resolved  CAP: completed abx course on 09/13/23.   Acute hypoxic respiratory failure: weaned off of supplemental oxygen. Resolved.    B/l pleural effusions: s/p right-sided thoracentesis with removal of 600 mL of pleural fluid on 09/16/2023.  Repeat chest x-ray shows improvement in pleural effusions.   Hemorrhagic shock: secondary to acute GI bleed, gastritis, duodenitis, oozing duodenal ulcer with visible vessel. S/p EGD on 09/08/2023, showed tortuous esophagus, diffuse inflammation in the esophagus and the small bowel which is not amenable to any endoscopic procedure. Repeat EGD on 09/15/2023 showed tortuous esophagus, dilation in the entire esophagus, gastritis, duodenitis, oozing duodenal ulcer with a visible vessel.  Clips were placed.  She treated with argon plasma coagulation. Continue on PPI   Acute blood loss anemia: secondary to hemorrhagic shock. S/p 3 units of pRBCs transfused so far. Will continue to monitor H&H    Thrombocytopenia: resolved. S/p 5 units of platelets transfused this admission. 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 MRI brain done 1/9 showed dural thickening versus thin subdural collection overlying the left cerebral hemisphere, measuring up to 3-4 mm in thickness. Neurosurgery reviewed imaging.  No indication for surgery. EEG showed moderate diffuse encephalopathy. No seizures or epileptiform discharges. Continue w/ supportive care    Pre-diabetes: continue on SSI w/ accuchecks    Closed wedge compression fracture of T3 vertebra: incidental finding of a T3 compression fracture on imaging. S/p fall prior to admission. Tylenol prn for pain   Hx of CVA: not on statin or aspirin    Seizure disorder: hx of seizure disorder since CVA in 2010.  No reported seizures since that time. EEG showed moderate diffuse encephalopathy. Not on any seizures meds at home    Dysphagia: likely secondary to achalasia. Needs surgery as per pt's surgery but has yet to get this done.  She was reevaluated by speech therapist.  Unfortunately, patient can't have a safe diet at this point. S/p PEG tube placement on 09/21/2023. Continue w/ tube feeds but re-consulted RD to change tube feeds likely the cause of the pt's diarrhea  Diarrhea: likely secondary to tube feeds. Re-consulted RD to change to tube feeds. Has rectal tube placed on 09/13/23 which will be d/c prior to discharge   General weakness: PT/OT recs SNF. Has SNF bed and but now waiting insurance auth       DVT prophylaxis: SCDs Code Status: full  Family Communication:   Disposition Plan: needs SNF placement  Level of care: Med-Surg  Status is: Inpatient Remains inpatient appropriate because: medically stable. Has a SNF bed but waiting on insurance auth      Consultants:    Procedures:   Antimicrobials:    Subjective: Pt c/o fatigue   Objective: Vitals:   09/25/23 0826 09/25/23 1503 09/26/23 0459 09/26/23 0922  BP:  112/80 128/81 102/73 113/72  Pulse: 79 79 96 85  Resp: 18 18 18 18   Temp: 98.1 F (36.7 C) 98.4 F (36.9 C) 98.1 F (36.7 C) 98 F (36.7 C)  TempSrc:   Oral Oral  SpO2: 100% 100% 100% 100%  Weight:      Height:        Intake/Output Summary (Last 24 hours) at 09/26/2023 1050 Last data filed at 09/26/2023 0035 Gross per 24 hour  Intake 1025.75 ml  Output 800 ml  Net 225.75 ml   Filed Weights   09/23/23 0500 09/24/23 0500 09/25/23 0500  Weight: 77.9 kg 76.1 kg 78.2 kg    Examination:  General exam: appears uncomfortable  Respiratory system: decreased breath sounds b/l  Cardiovascular system: S1/S2+. No rubs or clicks  Gastrointestinal system: abd is soft, NT, ND & hyperactive bowel sounds  Central nervous system: alert & awake. Moves all extremities  Psychiatry: judgement and insight appears poor. Flat mood and affect    Data Reviewed: I have personally reviewed following labs and imaging studies  CBC: Recent Labs  Lab 09/21/23 0426 09/22/23 0709 09/23/23 0639 09/24/23 0648 09/24/23 1912 09/25/23 0516 09/26/23 0525  WBC 6.3 7.1 8.5 8.0  --  8.3 8.4  NEUTROABS 3.9  --   --   --   --   --   --   HGB 7.3* 7.3* 7.4* 6.9* 8.8* 8.5* 8.6*  HCT 22.8* 23.6* 23.2* 21.6* 27.0* 25.9* 25.8*  MCV 97.4 99.6 97.5 98.6  --  95.9 93.5  PLT 208 262 270 293  --  298 302   Basic Metabolic Panel: Recent Labs  Lab 09/22/23 0709 09/23/23 0639 09/24/23 0648 09/25/23 0516 09/26/23 0525  NA 145 144 141 137 135  K 4.8 4.7 4.7 4.6 4.2  CL 116* 115* 113* 108 105  CO2 22 22 22  21* 23  GLUCOSE 133* 159* 128* 125* 125*  BUN 38* 43* 40* 40* 28*  CREATININE 0.79 0.75 0.71 0.63 0.65  CALCIUM 8.0* 8.0* 7.9* 8.1* 8.0*   GFR: Estimated Creatinine Clearance: 76 mL/min (by C-G formula based on SCr of 0.65 mg/dL). Liver Function Tests: No results for input(s): "AST", "ALT", "ALKPHOS", "BILITOT", "PROT", "ALBUMIN" in the last 168 hours.  No results for input(s): "LIPASE", "AMYLASE" in  the last 168 hours. No results for input(s): "AMMONIA" in the last 168 hours. Coagulation Profile: No results for input(s): "INR", "PROTIME" in the last 168 hours. Cardiac Enzymes: No results for input(s): "CKTOTAL", "CKMB", "CKMBINDEX", "TROPONINI" in the last 168 hours. BNP (last 3 results) No results for input(s): "PROBNP" in the last 8760 hours. HbA1C: No results for input(s): "HGBA1C" in the last 72 hours. CBG: Recent Labs  Lab 09/25/23 0553 09/25/23 1147 09/25/23 1603 09/26/23 0025 09/26/23 0605  GLUCAP 117* 134* 134* 109* 121*   Lipid Profile: No results for input(s): "CHOL", "HDL", "LDLCALC", "TRIG", "CHOLHDL", "LDLDIRECT" in the last 72 hours. Thyroid Function Tests: No results for input(s): "TSH", "T4TOTAL", "FREET4", "T3FREE", "THYROIDAB" in the last 72 hours. Anemia Panel: No results for input(s): "VITAMINB12", "FOLATE", "FERRITIN", "TIBC", "IRON", "RETICCTPCT" in the last 72 hours. Sepsis Labs: No results for  input(s): "PROCALCITON", "LATICACIDVEN" in the last 168 hours.  Recent Results (from the past 240 hours)  Body fluid culture w Gram Stain     Status: None   Collection Time: 09/16/23  2:20 PM   Specimen: PATH Cytology Pleural fluid  Result Value Ref Range Status   Specimen Description   Final    FLUID Performed at [facility], [address], [place], [place] [address]    Special Requests   Final    CYTO PLEU Performed at [facility], [address] [place], [place] [address]    Gram Stain   Final    RARE WBC PRESENT, PREDOMINANTLY MONONUCLEAR NO ORGANISMS SEEN    Culture   Final    NO GROWTH 3 DAYS Performed at [facility], [address] [place], [place] [address]    Report Status 09/20/2023 FINAL  Final         Radiology Studies: No results found.      Scheduled Meds:  Chlorhexidine Gluconate Cloth  6 each Topical Daily   vitamin B-12  1,000 mcg Per Tube Daily   free water  200 mL Per Tube Q6H    insulin aspart  0-9 Units Subcutaneous Q6H   nutrition supplement (JUVEN)  1 packet Per Tube BID BM   mouth rinse  15 mL Mouth Rinse 4 times per day   pantoprazole (PROTONIX) IV  40 mg Intravenous Q12H   sodium chloride flush  3 mL Intravenous Q12H   Continuous Infusions:  feeding supplement (OSMOLITE 1.5 CAL) 55 mL/hr at 09/26/23 0035     LOS: 22 days      Charise Killian, MD [facility] Pager [phone]  If 7PM-7AM, please contact night-coverage [email or web address] 09/26/2023, 10:50 AM

## 2023-09-26 NOTE — Plan of Care (Signed)
  Problem: Clinical Measurements: Goal: Diagnostic test results will improve Outcome: Progressing Goal: Signs and symptoms of infection will decrease Outcome: Progressing   Problem: Respiratory: Goal: Ability to maintain adequate ventilation will improve Outcome: Progressing   Problem: Elimination: Goal: Will not experience complications related to bowel motility Outcome: Progressing Goal: Will not experience complications related to urinary retention Outcome: Progressing   Problem: Nutrition: Goal: Adequate nutrition will be maintained Outcome: Progressing   Problem: Clinical Measurements: Goal: Ability to maintain clinical measurements within normal limits will improve Outcome: Progressing Goal: Will remain free from infection Outcome: Progressing Goal: Diagnostic test results will improve Outcome: Progressing Goal: Respiratory complications will improve Outcome: Progressing Goal: Cardiovascular complication will be avoided Outcome: Progressing

## 2023-09-26 NOTE — TOC Progression Note (Signed)
Transition of Care [facility]) - Progression Note    Patient Details  Name: Danetra Glock MRN: [id number] Date of Birth: [birth year]  Transition of Care [facility]) CM/SW Contact  Rodney Langton, RN Phone Number: 09/26/2023, 1:30 PM  Clinical Narrative:     Spoke with daughter, advised that only bed offer so far is [facility] in [place].  She agrees to accept, [facility] team will start working on insurance auth.   Expected Discharge Plan: Skilled Nursing Facility Barriers to Discharge: Continued Medical Work up, English as a second language teacher, SNF Pending bed offer  Expected Discharge Plan and Services     Post Acute Care Choice: Skilled Nursing Facility                                         Social Determinants of Health (SDOH) Interventions SDOH Screenings   Food Insecurity: No Food Insecurity (09/06/2023)  Housing: Low Risk  (09/06/2023)  Transportation Needs: No Transportation Needs (09/06/2023)  Utilities: Not At Risk (09/06/2023)  Alcohol Screen: Low Risk  (03/21/2021)  Depression (PHQ2-9): Medium Risk (03/21/2021)  Tobacco Use: High Risk (09/21/2023)    Readmission Risk Interventions     No data to display

## 2023-09-27 LAB — BASIC METABOLIC PANEL
Anion gap: 8 (ref 5–15)
BUN: 23 mg/dL (ref 8–23)
CO2: 24 mmol/L (ref 22–32)
Calcium: 8.5 mg/dL — ABNORMAL LOW (ref 8.9–10.3)
Chloride: 107 mmol/L (ref 98–111)
Creatinine, Ser: 0.61 mg/dL (ref 0.44–1.00)
GFR, Estimated: >60 mL/min (ref >60)
Glucose, Bld: 99 mg/dL (ref 70–99)
Potassium: 4.5 mmol/L (ref 3.5–5.1)
Sodium: 139 mmol/L (ref 135–145)

## 2023-09-27 LAB — CBC
HCT: 28.3 % — ABNORMAL LOW (ref 36.0–46.0)
Hemoglobin: 9.2 g/dL — ABNORMAL LOW (ref 12.0–15.0)
MCH: 30.9 pg (ref 26.0–34.0)
MCHC: 32.5 g/dL (ref 30.0–36.0)
MCV: 95 fL (ref 80.0–100.0)
Platelets: 335 10*3/uL (ref 150–400)
RBC: 2.98 MIL/uL — ABNORMAL LOW (ref 3.87–5.11)
RDW: 15.6 % — ABNORMAL HIGH (ref 11.5–15.5)
WBC: 8.7 10*3/uL (ref 4.0–10.5)
nRBC: 0 % (ref 0.0–0.2)

## 2023-09-27 LAB — GLUCOSE, CAPILLARY
Glucose-Capillary: 104 mg/dL — ABNORMAL HIGH (ref 70–99)
Glucose-Capillary: 150 mg/dL — ABNORMAL HIGH (ref 70–99)
Glucose-Capillary: 143 mg/dL — ABNORMAL HIGH (ref 70–99)

## 2023-09-27 MED ORDER — NUTRISOURCE FIBER PO PACK
1.0000 | PACK | Freq: Three times a day (TID) | ORAL | Status: DC
  Administered 2023-09-27 – 2023-09-30 (×10): 1
  Filled 2023-09-27 (×12): qty 1

## 2023-09-27 NOTE — Progress Notes (Signed)
Patient goals are for ongoing full scope medical interventions without limits. Awaiting placement at SNF. Palliative care will sign off at this time. Please re-consult if her condition deteriorates or her goals of care change.  Anderson Malta, DO Palliative Medicine

## 2023-09-27 NOTE — Progress Notes (Signed)
Physical Therapy Treatment Patient Details Name: Kaitlyn Hodges MRN: [id number] DOB: [birth year] Today's Date: 09/27/2023   History of Present Illness Kaitlyn Hodges is a 63 y.o. female with medical history significant of prediabetes, CVA, seizure disorder, type I achalasia,tobacco use disorder, erosive esophagitis who presents to the ED due to altered mental status. Patient's daughter states that over the last 1 month, patient has been experiencing significant shortness of breath and cough that has progressively worsened.  Then for the last 1 week, she has been experiencing persistent nausea, vomiting and poor p.o. intake.    PT Comments  Patient is agreeable to PT session. The patient was able to perform bed mobility today with one person assistance. Sitting tolerance continues to be limited by generalized weakness. Min A initially to maintain midline with periods of stand by assistance. No dizziness reported with upright activity. Recommend to continue PT to maximize independence and decrease caregiver burden. Rehabilitation < 3 hours/day recommended after this hospital stay.    If plan is discharge home, recommend the following: Two people to help with walking and/or transfers;Two people to help with bathing/dressing/bathroom   Can travel by private vehicle     No  Equipment Recommendations   (to be determined at next level of care)    Recommendations for Other Services       Precautions / Restrictions Precautions Precautions: Fall Precaution Comments: PEG, FMS Restrictions Weight Bearing Restrictions Per Provider Order: No     Mobility  Bed Mobility Overal bed mobility: Needs Assistance Bed Mobility: Supine to Sit, Sit to Supine     Supine to sit: Max assist Sit to supine: Mod assist   General bed mobility comments: assistance for trunk and BLE support to sit up. assistance for LE support to return to bed. cues for technique    Transfers                   General  transfer comment: unable to attempt due to poor siotting tolerance and generalized weakness    Ambulation/Gait                   Stairs             Wheelchair Mobility     Tilt Bed    Modified Rankin (Stroke Patients Only)       Balance Overall balance assessment: Needs assistance Sitting-balance support: Single extremity supported, Feet supported Sitting balance-Leahy Scale: Poor Sitting balance - Comments: total sitting time around 3 minutes. Min A required to maintain midline with periods of stand by assistance. encouragement to increase sitting time in preparation for eventual transfer training                                    Cognition Arousal: Alert Behavior During Therapy: Flat affect Overall Cognitive Status: Impaired/Different from baseline                         Following Commands: Follows one step commands with increased time Safety/Judgement: Decreased awareness of deficits   Problem Solving: Slow processing, Decreased initiation, Difficulty sequencing, Requires verbal cues, Requires tactile cues          Exercises      General Comments        Pertinent Vitals/Pain Pain Assessment Pain Assessment: Faces Faces Pain Scale: Hurts little more Pain Location: abdomen Pain Descriptors / Indicators: Discomfort,  Guarding Pain Intervention(s): Monitored during session, Limited activity within patient's tolerance, Repositioned    Home Living                          Prior Function            PT Goals (current goals can now be found in the care plan section) Acute Rehab PT Goals Patient Stated Goal: to feel better PT Goal Formulation: With patient Time For Goal Achievement: 10/01/23 Potential to Achieve Goals: Fair Progress towards PT goals: Progressing toward goals    Frequency    Min 1X/week      PT Plan      Co-evaluation              AM-PAC PT "6 Clicks" Mobility   Outcome  Measure  Help needed turning from your back to your side while in a flat bed without using bedrails?: A Lot Help needed moving from lying on your back to sitting on the side of a flat bed without using bedrails?: A Lot Help needed moving to and from a bed to a chair (including a wheelchair)?: Total Help needed standing up from a chair using your arms (e.g., wheelchair or bedside chair)?: Total Help needed to walk in hospital room?: Total Help needed climbing 3-5 steps with a railing? : Total 6 Click Score: 8    End of Session   Activity Tolerance: Patient tolerated treatment well;Patient limited by fatigue Patient left: in bed;with call bell/phone within reach;with bed alarm set;with nursing/sitter in room (RN in the room) Nurse Communication: Mobility status PT Visit Diagnosis: Muscle weakness (generalized) (M62.81);Other abnormalities of gait and mobility (R26.89);Difficulty in walking, not elsewhere classified (R26.2)     Time: 4098-1191 PT Time Calculation (min) (ACUTE ONLY): 15 min  Charges:    $Therapeutic Activity: 8-22 mins PT General Charges $$ ACUTE PT VISIT: 1 Visit                    Kaitlyn Hodges, PT, MPT    Kaitlyn Hodges 09/27/2023, 9:01 AM

## 2023-09-27 NOTE — Progress Notes (Signed)
PROGRESS NOTE    Kaitlyn Hodges  WUJ:[id number] DOB: 07-Mar-1961 DOA: 09/04/2023 PCP: Malva Limes, MD    Assessment & Plan:   Principal Problem:   Severe sepsis [facility]) Active Problems:   Acute renal failure (HCC)   Acute encephalopathy   Achalasia   Pre-diabetes   Seizure disorder (HCC)   History of CVA (cerebrovascular accident)   Closed wedge compression fracture of T3 vertebra (HCC)   Melena   Hemorrhagic shock (HCC)   Gastrointestinal hemorrhage   Lactic acidosis   Pressure injury of skin   Malnutrition of moderate degree   Upper GI bleed   Thrombocytopenia (HCC)   Hypophosphatemia   Acute hypoxic respiratory failure (HCC)   Duodenitis   Oropharyngeal dysphagia   Oral phase dysphagia  Assessment and Plan: Severe sepsis: see Dr. Helayne Seminole notes on how pt met severe sepsis criteria. Resolved  CAP: completed abx course on 09/13/23.   Acute hypoxic respiratory failure: weaned off of supplemental oxygen. Resolved.    B/l pleural effusions: s/p right-sided thoracentesis with removal of 600 mL of pleural fluid on 09/16/2023.  Repeat chest x-ray shows improvement in pleural effusions.   Hemorrhagic shock: secondary to acute GI bleed, gastritis, duodenitis, oozing duodenal ulcer with visible vessel. S/p EGD on 09/08/2023, showed tortuous esophagus, diffuse inflammation in the esophagus and the small bowel which is not amenable to any endoscopic procedure. Repeat EGD on 09/15/2023 showed tortuous esophagus, dilation in the entire esophagus, gastritis, duodenitis, oozing duodenal ulcer with a visible vessel.  Clips were placed.  She treated with argon plasma coagulation. Continue on PPI   Acute blood loss anemia: secondary to hemorrhagic shock. S/p 3 units of pRBCs transfused so far. Will continue to monitor H&H    Thrombocytopenia: resolved. S/p 5 units of platelets transfused this admission. 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 MRI brain done 1/9 showed dural thickening versus thin subdural collection overlying the left cerebral hemisphere, measuring up to 3-4 mm in thickness. Neurosurgery reviewed imaging.  No indication for surgery. EEG showed moderate diffuse encephalopathy. No seizures or epileptiform discharges. Continue on supportive care    Pre-diabetes: continue on SSI w/ accuchecks    Closed wedge compression fracture of T3 vertebra: incidental finding of a T3 compression fracture on imaging. S/p fall prior to admission. Tylenol prn for pain   Hx of CVA: continue on aspirin or statin    Seizure disorder: hx of seizure disorder since CVA in 2010.  No reported seizures since that time. EEG showed moderate diffuse encephalopathy. Not on any seizures meds at home    Dysphagia: likely secondary to achalasia. Needs surgery as per pt's surgery but has yet to get this done.  She was reevaluated by speech therapist.  Unfortunately, patient can't have a safe diet at this point. S/p PEG tube placement on 09/21/2023. Continue w/ tube feeds   Diarrhea: likely secondary to tube feeds. Will start fiber and continue w/ current tube feeds as per RD. Has rectal tube placed on 09/13/23 which will be d/c prior to discharge   General weakness: PT/OT recs SNF. Has SNF bed and still waiting on insurance auth       DVT prophylaxis: SCDs Code Status: full  Family Communication:   Disposition Plan: needs SNF placement  Level of care: Med-Surg  Status is:  Inpatient Remains inpatient appropriate because: has diarrhea likely from tube feeds, started on fiber as per RD. Has a SNF bed but waiting on insurance auth      Consultants:    Procedures:   Antimicrobials:    Subjective: Pt c/o malaise   Objective: Vitals:   09/26/23 1926 09/27/23 0259 09/27/23 0420 09/27/23 0751  BP:  134/89 123/75  114/65  Pulse: (!) 101 89    Resp: 18 17  16   Temp: 97.7 F (36.5 C) 97.9 F (36.6 C)  (!) 97.4 F (36.3 C)  TempSrc: Oral Oral  Oral  SpO2: 100% 100%    Weight:   72.1 kg   Height:        Intake/Output Summary (Last 24 hours) at 09/27/2023 0840 Last data filed at 09/27/2023 0636 Gross per 24 hour  Intake 1345.67 ml  Output 1900 ml  Net -554.33 ml   Filed Weights   09/24/23 0500 09/25/23 0500 09/27/23 0420  Weight: 76.1 kg 78.2 kg 72.1 kg    Examination:  General exam: appears comfortable   Respiratory system: diminished breath sounds b/l   Cardiovascular system: S1 & S2+. No rubs or clicks  Gastrointestinal system: abd is soft, NT, ND & hyperactive bowel sounds  Central nervous system: alert & awake. Moves all extremities  Psychiatry: judgement and insight appears poor. Flat mood and affect    Data Reviewed: I have personally reviewed following labs and imaging studies  CBC: Recent Labs  Lab 09/21/23 0426 09/22/23 0709 09/23/23 0639 09/24/23 0648 09/24/23 1912 09/25/23 0516 09/26/23 0525 09/27/23 0525  WBC 6.3   < > 8.5 8.0  --  8.3 8.4 8.7  NEUTROABS 3.9  --   --   --   --   --   --   --   HGB 7.3*   < > 7.4* 6.9* 8.8* 8.5* 8.6* 9.2*  HCT 22.8*   < > 23.2* 21.6* 27.0* 25.9* 25.8* 28.3*  MCV 97.4   < > 97.5 98.6  --  95.9 93.5 95.0  PLT 208   < > 270 293  --  298 302 335   < > = values in this interval not displayed.   Basic Metabolic Panel: Recent Labs  Lab 09/23/23 0639 09/24/23 0648 09/25/23 0516 09/26/23 0525 09/27/23 0525  NA 144 141 137 135 139  K 4.7 4.7 4.6 4.2 4.5  CL 115* 113* 108 105 107  CO2 22 22 21* 23 24  GLUCOSE 159* 128* 125* 125* 99  BUN 43* 40* 40* 28* 23  CREATININE 0.75 0.71 0.63 0.65 0.61  CALCIUM 8.0* 7.9* 8.1* 8.0* 8.5*   GFR: Estimated Creatinine Clearance: 73.2 mL/min (by C-G formula based on SCr of 0.61 mg/dL). Liver Function Tests: No results for input(s): "AST", "ALT", "ALKPHOS", "BILITOT", "PROT",  "ALBUMIN" in the last 168 hours.  No results for input(s): "LIPASE", "AMYLASE" in the last 168 hours. No results for input(s): "AMMONIA" in the last 168 hours. Coagulation Profile: No results for input(s): "INR", "PROTIME" in the last 168 hours. Cardiac Enzymes: No results for input(s): "CKTOTAL", "CKMB", "CKMBINDEX", "TROPONINI" in the last 168 hours. BNP (last 3 results) No results for input(s): "PROBNP" in the last 8760 hours. HbA1C: No results for input(s): "HGBA1C" in the last 72 hours. CBG: Recent Labs  Lab 09/26/23 0605 09/26/23 1128 09/26/23 1633 09/26/23 2336 09/27/23 0524  GLUCAP 121* 92 147* 141* 104*   Lipid Profile: No results for input(s): "CHOL", "HDL", "LDLCALC", "TRIG", "CHOLHDL", "LDLDIRECT" in  the last 72 hours. Thyroid Function Tests: No results for input(s): "TSH", "T4TOTAL", "FREET4", "T3FREE", "THYROIDAB" in the last 72 hours. Anemia Panel: No results for input(s): "VITAMINB12", "FOLATE", "FERRITIN", "TIBC", "IRON", "RETICCTPCT" in the last 72 hours. Sepsis Labs: No results for input(s): "PROCALCITON", "LATICACIDVEN" in the last 168 hours.  No results found for this or any previous visit (from the past 240 hours).        Radiology Studies: No results found.      Scheduled Meds:  vitamin B-12  1,000 mcg Per Tube Daily   free water  200 mL Per Tube Q6H   insulin aspart  0-9 Units Subcutaneous Q6H   nutrition supplement (JUVEN)  1 packet Per Tube BID BM   mouth rinse  15 mL Mouth Rinse 4 times per day   pantoprazole (PROTONIX) IV  40 mg Intravenous Q12H   sodium chloride flush  3 mL Intravenous Q12H   Continuous Infusions:  feeding supplement (OSMOLITE 1.5 CAL) 1,000 mL (09/27/23 0736)     LOS: 23 days      Charise Killian, MD [facility] Pager [phone]  If 7PM-7AM, please contact night-coverage [email or web address] 09/27/2023, 8:40 AM

## 2023-09-28 LAB — CBC
HCT: 26.5 % — ABNORMAL LOW (ref 36.0–46.0)
Hemoglobin: 8.6 g/dL — ABNORMAL LOW (ref 12.0–15.0)
MCH: 31.4 pg (ref 26.0–34.0)
MCHC: 32.5 g/dL (ref 30.0–36.0)
MCV: 96.7 fL (ref 80.0–100.0)
Platelets: 299 10*3/uL (ref 150–400)
RBC: 2.74 MIL/uL — ABNORMAL LOW (ref 3.87–5.11)
RDW: 15.7 % — ABNORMAL HIGH (ref 11.5–15.5)
WBC: 7.8 10*3/uL (ref 4.0–10.5)
nRBC: 0 % (ref 0.0–0.2)

## 2023-09-28 LAB — GLUCOSE, CAPILLARY
Glucose-Capillary: 108 mg/dL — ABNORMAL HIGH (ref 70–99)
Glucose-Capillary: 169 mg/dL — ABNORMAL HIGH (ref 70–99)
Glucose-Capillary: 86 mg/dL (ref 70–99)
Glucose-Capillary: 91 mg/dL (ref 70–99)

## 2023-09-28 LAB — BASIC METABOLIC PANEL
Anion gap: 7 (ref 5–15)
BUN: 28 mg/dL — ABNORMAL HIGH (ref 8–23)
CO2: 23 mmol/L (ref 22–32)
Calcium: 8.4 mg/dL — ABNORMAL LOW (ref 8.9–10.3)
Chloride: 107 mmol/L (ref 98–111)
Creatinine, Ser: 0.56 mg/dL (ref 0.44–1.00)
GFR, Estimated: >60 mL/min (ref >60)
Glucose, Bld: 105 mg/dL — ABNORMAL HIGH (ref 70–99)
Potassium: 4.6 mmol/L (ref 3.5–5.1)
Sodium: 137 mmol/L (ref 135–145)

## 2023-09-28 NOTE — Plan of Care (Signed)
Problem: Fluid Volume: Goal: Hemodynamic stability will improve Outcome: Progressing   Problem: Clinical Measurements: Goal: Diagnostic test results will improve Outcome: Progressing Goal: Signs and symptoms of infection will decrease Outcome: Progressing   Problem: Respiratory: Goal: Ability to maintain adequate ventilation will improve Outcome: Progressing

## 2023-09-28 NOTE — TOC Progression Note (Signed)
Transition of Care [facility]) - Progression Note    Patient Details  Name: Kaitlyn Hodges MRN: [id number] Date of Birth: [birth year]  Transition of [facility]) CM/SW Contact  Chapman Fitch, RN Phone Number: 09/28/2023, 6:18 PM  Clinical Narrative:     Notified by [place] at [facility] that they have auth to admit patient Per MD patient still with rectal tube and diarrhea.  [place] at [employer] updated.  She does not see an expiration date on the auth.  She request and update tomorrow   Expected Discharge Plan: Skilled Nursing Facility Barriers to Discharge: Continued Medical Work up, English as a second language teacher, SNF Pending bed offer  Expected Discharge Plan and Services     Post Acute Care Choice: Skilled Nursing Facility                                         Social Determinants of Health (SDOH) Interventions SDOH Screenings   Food Insecurity: No Food Insecurity (09/06/2023)  Housing: Low Risk  (09/06/2023)  Transportation Needs: No Transportation Needs (09/06/2023)  Utilities: Not At Risk (09/06/2023)  Alcohol Screen: Low Risk  (03/21/2021)  Depression (PHQ2-9): Medium Risk (03/21/2021)  Tobacco Use: High Risk (09/21/2023)    Readmission Risk Interventions     No data to display

## 2023-09-28 NOTE — Progress Notes (Signed)
Occupational Therapy Treatment Patient Details Name: Kaitlyn Hodges MRN: [id number] DOB: 1961-03-08 Today's Date: 09/28/2023   History of present illness Kaitlyn Hodges is a 63 y.o. female with medical history significant of prediabetes, CVA, seizure disorder, type I achalasia,tobacco use disorder, erosive esophagitis who presents to the ED due to altered mental status. Patient's daughter states that over the last 1 month, patient has been experiencing significant shortness of breath and cough that has progressively worsened.  Then for the last 1 week, she has been experiencing persistent nausea, vomiting and poor p.o. intake.   OT comments  Pt is supine in bed on arrival. Easily arousable and agreeable to OT session. She reports minimal pain in her back during session. Performed supine to sit at EOB with Max A, HOB elevated and cueing for sequencing and bed rail use. Tolerated increased time sitting today ~10 mins with Min A initially progressing to SBA. Pt able to brush her hair seated EOB with SBA and performed lateral weight shifts to elbows x10 with no LOB posteriorly. Pt was fatigued by end of session and requesting to lay back down due to this. Mod A for BLE management to return to bed. Pt able to follow single step commands with increased time today, but remains with flat affect and slow processing. She was left with all needs in place and will cont to require skilled acute OT services to maximize her safety and IND to return to PLOF.       If plan is discharge home, recommend the following:  Two people to help with walking and/or transfers;Two people to help with bathing/dressing/bathroom   Equipment Recommendations  Hospital bed;Hoyer lift    Recommendations for Other Services      Precautions / Restrictions Precautions Precautions: Fall Precaution Comments: PEG, FMS Restrictions Weight Bearing Restrictions Per Provider Order: No       Mobility Bed Mobility Overal bed mobility:  Needs Assistance Bed Mobility: Supine to Sit, Sit to Supine     Supine to sit: Max assist, Used rails, HOB elevated Sit to supine: Mod assist   General bed mobility comments: cueing for proper technique, Max A for reaching EOB; Mod A for BLE management to return to bed    Transfers                   General transfer comment: deferred d/t continued diffculty with seated balance     Balance Overall balance assessment: Needs assistance Sitting-balance support: Single extremity supported, Feet supported Sitting balance-Leahy Scale: Poor Sitting balance - Comments: increased sitting tolerance to ~10-12 mins today with Min to SBA for seated balance; able to lateral weight shifts to elbows x10 reps and able to comb hair seated EOB with SBA                                   ADL either performed or assessed with clinical judgement   ADL Overall ADL's : Needs assistance/impaired     Grooming: Brushing hair;Sitting;Contact guard assist Grooming Details (indicate cue type and reason): CGA for balance while seated EOB to brush her hair using bil hands                                    Extremity/Trunk Assessment              Vision  Perception     Praxis      Cognition Arousal: Alert Behavior During Therapy: Flat affect Overall Cognitive Status: Impaired/Different from baseline                         Following Commands: Follows one step commands with increased time Safety/Judgement: Decreased awareness of deficits   Problem Solving: Slow processing, Decreased initiation, Difficulty sequencing, Requires verbal cues, Requires tactile cues          Exercises Other Exercises Other Exercises: increased sitting tolerance to ~10-12 mins today with Min to SBA for seated balance; able to lateral weight shifts to elbows x10 reps and able to comb hair seated EOB with SBA    Shoulder Instructions       General Comments       Pertinent Vitals/ Pain       Pain Assessment Pain Assessment: 0-10 Pain Score: 2  Pain Location: back Pain Descriptors / Indicators: Discomfort Pain Intervention(s): Monitored during session, Repositioned  Home Living                                          Prior Functioning/Environment              Frequency  Min 1X/week        Progress Toward Goals  OT Goals(current goals can now be found in the care plan section)  Progress towards OT goals: Progressing toward goals  Acute Rehab OT Goals Patient Stated Goal: feel better OT Goal Formulation: With patient Time For Goal Achievement: 10/01/23 Potential to Achieve Goals: Fair  Plan      Co-evaluation                 AM-PAC OT "6 Clicks" Daily Activity     Outcome Measure   Help from another person eating meals?: A Little Help from another person taking care of personal grooming?: A Little Help from another person toileting, which includes using toliet, bedpan, or urinal?: Total Help from another person bathing (including washing, rinsing, drying)?: A Lot Help from another person to put on and taking off regular upper body clothing?: A Lot Help from another person to put on and taking off regular lower body clothing?: Total 6 Click Score: 12    End of Session    OT Visit Diagnosis: Other abnormalities of gait and mobility (R26.89);Muscle weakness (generalized) (M62.81)   Activity Tolerance Patient tolerated treatment well   Patient Left in bed;with call bell/phone within reach;with bed alarm set   Nurse Communication Mobility status        Time: 1610-9604 OT Time Calculation (min): 20 min  Charges: OT General Charges $OT Visit: 1 Visit OT Treatments $Therapeutic Activity: 8-22 mins  Kaitlyn Hodges, OTR/L  09/28/23, 1:17 PM   Kaitlyn Hodges E Ayo Guarino 09/28/2023, 1:14 PM

## 2023-09-28 NOTE — Progress Notes (Signed)
PROGRESS NOTE   HPI was taken from Dr. Huel Cote: Kaitlyn Hodges is a 63 y.o. female with medical history significant of prediabetes, CVA, seizure disorder, type I achalasia,tobacco use disorder, erosive esophagitis who presents to the ED due to altered mental status.   History obtained from patient's daughter at bedside due to patient's altered mental status.  Patient's daughter states that over the last 1 month, patient has been experiencing significant shortness of breath and cough that has progressively worsened.  Then for the last 1 week, she has been experiencing persistent nausea, vomiting and poor p.o. intake.  She notes that her mom has been handling her achalasia for years, and she has not noticed any choking episodes.  She is unsure regarding any fevers, chest pain, abdominal pain.   When asked, patient denies any pain at this time and is aware that she is at the hospital.  She is uncertain what brought her in, stating that she may have had a fall yesterday.  Patient's daughter states that fall occurred 2 weeks prior.   ED course: On arrival to the ED, patient was hypotensive at 86/68 with heart rate of 114.  She was saturating at 98% on room air.  She was afebrile at 97.6.  Initial workup notable for WBC of 32.9, hemoglobin of 17.8, bicarb 18, anion gap 26, glucose 216, BUN 114, creatinine 4.61 with GFR of 10.  Lactic acid elevated at 7.7 with decreased to 5.0.  COVID-19, influenza and RSV PCR negative.  Pan scan was obtained with findings notable for possible left parotiditis, central airway thickening and patchy airspace opacities in the bases, and age-indeterminate T3 fracture less than 10%.  Patient was started on IV fluids, broad-spectrum antibiotics.  TR[facility] contacted for admission.   As per Dr. Myriam Forehand: Kaitlyn Hodges is a 63 y.o. female with medical history significant of prediabetes, CVA, seizure disorder, type I achalasia,tobacco use disorder, erosive esophagitis, who presented to the  hospital with altered mental status.  Her daughter reported cough and shortness of breath for about a month prior to admission.  She also reported nausea, vomiting and poor oral intake for about a week prior to admission Patient was afebrile, hypotensive and tachycardic on vitals. She had a mild acidosis with increased AG. Noted lactate of 7.7. WBC of 32.9. BUN/cr 114/4.61. COVID, flu, RSV negative. Pan scan notable for possible L parotiditis, central airway thickening and patchy airspace opacities in the bases, age indeterminate T3 fracture.  Patient was started on broad spectrum antibiotics and fluid resuscitated.   As per Dr. Mayford Knife 1/22-1/28/25: Pt has remained relatively stable. Pt did require 1 unit of pRBCs for Hb 6.9. Also, pt having diarrhea likely secondary to tube feeds. RD re-consulted to change to tube feeds and they elected to start the pt on fiber. Pt is still having diarrhea w/ fiber but will give it some more time and will likely have to re-consult RD to get the tube feeds changed. Rectal tube was placed evidently 09/13/23 and needs to be removed prior to d/c. [place] and insurance auth already as well.    Kaitlyn Hodges  WUJ:[id number] DOB: October 17, 1960 DOA: 09/04/2023 PCP: Malva Limes, MD    Assessment & Plan:   Principal Problem:   Severe sepsis [facility]) Active Problems:   Acute renal failure (HCC)   Acute encephalopathy   Achalasia   Pre-diabetes   Seizure disorder (HCC)   History of CVA (cerebrovascular accident)   Closed wedge compression  fracture of T3 vertebra (HCC)   Melena   Hemorrhagic shock (HCC)   Gastrointestinal hemorrhage   Lactic acidosis   Pressure injury of skin   Malnutrition of moderate degree   Upper GI bleed   Thrombocytopenia (HCC)   Hypophosphatemia   Acute hypoxic respiratory failure (HCC)   Duodenitis   Oropharyngeal dysphagia   Oral phase dysphagia  Assessment and Plan: Severe sepsis: see Dr. Helayne Seminole notes on how pt  met severe sepsis criteria. Resolved  CAP: completed abx course on 09/13/23.   Acute hypoxic respiratory failure: weaned off of supplemental oxygen. Resolved.    B/l pleural effusions: s/p right-sided thoracentesis with removal of 600 mL of pleural fluid on 09/16/2023.  Repeat chest x-ray shows improvement in pleural effusions.   Hemorrhagic shock: secondary to acute GI bleed, gastritis, duodenitis, oozing duodenal ulcer with visible vessel. S/p EGD on 09/08/2023, showed tortuous esophagus, diffuse inflammation in the esophagus and the small bowel which is not amenable to any endoscopic procedure. Repeat EGD on 09/15/2023 showed tortuous esophagus, dilation in the entire esophagus, gastritis, duodenitis, oozing duodenal ulcer with a visible vessel.  Clips were placed.  She treated with argon plasma coagulation. Continue on PPI   Acute blood loss anemia: secondary to hemorrhagic shock. S/p 3 units of pRBCs transfused so far. Will continue to monitor H&H    Thrombocytopenia: resolved. S/p 5 units of platelets transfused this admission. 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 MRI brain done 1/9 showed dural thickening versus thin subdural collection overlying the left cerebral hemisphere, measuring up to 3-4 mm in thickness. Neurosurgery reviewed imaging.  No indication for surgery. EEG showed moderate diffuse encephalopathy. No seizures or epileptiform discharges. Continue w/ supportive care   Pre-diabetes: continue on SSI w/ accuchecks    Closed wedge compression fracture of T3 vertebra: incidental finding of a T3 compression fracture on imaging. S/p fall prior to admission. Tylenol prn for pain   Hx of CVA: not on aspirin or statin as per med rec    Seizure disorder: hx of seizure disorder since CVA in 2010.  No  reported seizures since that time. EEG showed moderate diffuse encephalopathy. Not on any seizures meds at home    Dysphagia: likely secondary to achalasia. Needs surgery as per pt's surgery but has yet to get this done.  She was reevaluated by speech therapist.  Unfortunately, patient can't have a safe diet at this point. S/p PEG tube placement on 09/21/2023. Continue w/ tube feeds   Diarrhea: likely secondary to tube feeds. Continue w/ fiber & continue w/ current tube feeds as per RD. May need to re-consult RD if diarrhea does not improve with the fiber. Has rectal tube placed on 09/13/23 which will be d/c prior to discharge   General weakness: PT/OT recs SNF. Has SNF bed and insurance auth       DVT prophylaxis: SCDs Code Status: full  Family Communication:   Disposition Plan: needs SNF placement  Level of care: Med-Surg  Status is: Inpatient Remains inpatient appropriate because: has diarrhea likely from tube feeds, started on fiber (as per RD) and waiting on diarrhea to improve so rectal tube can be removed and pt can be d/c to SNF. Pt has SNF bed and insurance auth     Consultants:    Procedures:  Antimicrobials:    Subjective: Pt c/o fatigue   Objective: Vitals:   09/27/23 2012 09/28/23 0358 09/28/23 0442 09/28/23 0744  BP: 120/83 98/61  111/69  Pulse: 93 82  94  Resp: 20 20  16   Temp: 98.1 F (36.7 C) 97.7 F (36.5 C)  97.7 F (36.5 C)  TempSrc: Oral Oral  Oral  SpO2: 100% (!) 85%  100%  Weight:   75.1 kg   Height:        Intake/Output Summary (Last 24 hours) at 09/28/2023 0903 Last data filed at 09/28/2023 0441 Gross per 24 hour  Intake --  Output 2100 ml  Net -2100 ml   Filed Weights   09/25/23 0500 09/27/23 0420 09/28/23 0442  Weight: 78.2 kg 72.1 kg 75.1 kg    Examination:  General exam: appears calm & comfortable  Respiratory system: decreased breath sounds b/l  Cardiovascular system: S1/S2+. No rubs or clicks  Gastrointestinal system: abd  is soft, NT, ND & hyperactive bowel sounds  Central nervous system: alert & awake. Moves all extremities  Psychiatry: judgement and insight appears poor. Flat mood and affect    Data Reviewed: I have personally reviewed following labs and imaging studies  CBC: Recent Labs  Lab 09/24/23 0648 09/24/23 1912 09/25/23 0516 09/26/23 0525 09/27/23 0525 09/28/23 0506  WBC 8.0  --  8.3 8.4 8.7 7.8  HGB 6.9* 8.8* 8.5* 8.6* 9.2* 8.6*  HCT 21.6* 27.0* 25.9* 25.8* 28.3* 26.5*  MCV 98.6  --  95.9 93.5 95.0 96.7  PLT 293  --  298 302 335 299   Basic Metabolic Panel: Recent Labs  Lab 09/24/23 0648 09/25/23 0516 09/26/23 0525 09/27/23 0525 09/28/23 0506  NA 141 137 135 139 137  K 4.7 4.6 4.2 4.5 4.6  CL 113* 108 105 107 107  CO2 22 21* 23 24 23   GLUCOSE 128* 125* 125* 99 105*  BUN 40* 40* 28* 23 28*  CREATININE 0.71 0.63 0.65 0.61 0.56  CALCIUM 7.9* 8.1* 8.0* 8.5* 8.4*   GFR: Estimated Creatinine Clearance: 74.5 mL/min (by C-G formula based on SCr of 0.56 mg/dL). Liver Function Tests: No results for input(s): "AST", "ALT", "ALKPHOS", "BILITOT", "PROT", "ALBUMIN" in the last 168 hours.  No results for input(s): "LIPASE", "AMYLASE" in the last 168 hours. No results for input(s): "AMMONIA" in the last 168 hours. Coagulation Profile: No results for input(s): "INR", "PROTIME" in the last 168 hours. Cardiac Enzymes: No results for input(s): "CKTOTAL", "CKMB", "CKMBINDEX", "TROPONINI" in the last 168 hours. BNP (last 3 results) No results for input(s): "PROBNP" in the last 8760 hours. HbA1C: No results for input(s): "HGBA1C" in the last 72 hours. CBG: Recent Labs  Lab 09/27/23 0524 09/27/23 1115 09/27/23 1749 09/28/23 0040 09/28/23 0546  GLUCAP 104* 150* 143* 91 108*   Lipid Profile: No results for input(s): "CHOL", "HDL", "LDLCALC", "TRIG", "CHOLHDL", "LDLDIRECT" in the last 72 hours. Thyroid Function Tests: No results for input(s): "TSH", "T4TOTAL", "FREET4", "T3FREE",  "THYROIDAB" in the last 72 hours. Anemia Panel: No results for input(s): "VITAMINB12", "FOLATE", "FERRITIN", "TIBC", "IRON", "RETICCTPCT" in the last 72 hours. Sepsis Labs: No results for input(s): "PROCALCITON", "LATICACIDVEN" in the last 168 hours.  No results found for this or any previous visit (from the past 240 hours).        Radiology Studies: No results found.      Scheduled Meds:  vitamin B-12  1,000 mcg Per Tube Daily   fiber  1 packet Per Tube TID   free  water  200 mL Per Tube Q6H   insulin aspart  0-9 Units Subcutaneous Q6H   nutrition supplement (JUVEN)  1 packet Per Tube BID BM   mouth rinse  15 mL Mouth Rinse 4 times per day   pantoprazole (PROTONIX) IV  40 mg Intravenous Q12H   sodium chloride flush  3 mL Intravenous Q12H   Continuous Infusions:  feeding supplement (OSMOLITE 1.5 CAL) 1,000 mL (09/28/23 0439)     LOS: 24 days      Charise Killian, MD [facility] Pager [phone]  If 7PM-7AM, please contact night-coverage [email or web address] 09/28/2023, 9:03 AM

## 2023-09-29 DIAGNOSIS — R6520 Severe sepsis without septic shock: Secondary | ICD-10-CM | POA: Diagnosis not present

## 2023-09-29 DIAGNOSIS — A419 Sepsis, unspecified organism: Secondary | ICD-10-CM | POA: Diagnosis not present

## 2023-09-29 LAB — GLUCOSE, CAPILLARY
Glucose-Capillary: 109 mg/dL — ABNORMAL HIGH (ref 70–99)
Glucose-Capillary: 116 mg/dL — ABNORMAL HIGH (ref 70–99)
Glucose-Capillary: 136 mg/dL — ABNORMAL HIGH (ref 70–99)
Glucose-Capillary: 145 mg/dL — ABNORMAL HIGH (ref 70–99)
Glucose-Capillary: 91 mg/dL (ref 70–99)

## 2023-09-29 NOTE — Progress Notes (Signed)
Physical Therapy Treatment Patient Details Name: Kaitlyn Hodges MRN: [id number] DOB: Mar 25, 1961 Today's Date: 09/29/2023   History of Present Illness Kaitlyn Hodges is a 63 y.o. female with medical history significant of prediabetes, CVA, seizure disorder, type I achalasia,tobacco use disorder, erosive esophagitis who presents to the ED due to altered mental status. Patient's daughter states that over the last 1 month, patient has been experiencing significant shortness of breath and cough that has progressively worsened.  Then for the last 1 week, she has been experiencing persistent nausea, vomiting and poor p.o. intake.    PT Comments  Patient received in supine. She is alert, confused. Patient is agreeable to PT session. Patient requires max assist for supine to sit. Min A to cga for sitting balance with continued cues for upright posture in sitting. Posterior lean with one lob falling backward on bed. Patient will continue to benefit from skilled PT to improve strength, endurance and balance.      If plan is discharge home, recommend the following: Two people to help with walking and/or transfers;A lot of help with bathing/dressing/bathroom   Can travel by private vehicle     No  Equipment Recommendations  None recommended by PT;Other (comment) (TBD)    Recommendations for Other Services       Precautions / Restrictions Precautions Precautions: Fall Precaution Comments: PEG, FMS Restrictions Weight Bearing Restrictions Per Provider Order: No     Mobility  Bed Mobility Overal bed mobility: Needs Assistance Bed Mobility: Supine to Sit, Sit to Supine     Supine to sit: Max assist, HOB elevated, Used rails Sit to supine: Max assist, HOB elevated, Used rails   General bed mobility comments: cueing for proper technique, Max A for reaching EOB; Max A for BLE management to return to bed and to get scooted up in bed    Transfers                   General transfer comment:  unable due to poor trunk control in sitting    Ambulation/Gait               General Gait Details: unable   Stairs             Wheelchair Mobility     Tilt Bed    Modified Rankin (Stroke Patients Only)       Balance Overall balance assessment: Needs assistance Sitting-balance support: Feet supported, Single extremity supported Sitting balance-Leahy Scale: Poor Sitting balance - Comments: Patient able to sit edge of bed with cga, requires cues for upright posture and to engage core. Patient easily fatigued with sitting. Postural control: Posterior lean                                  Cognition Arousal: Alert Behavior During Therapy: Flat affect Overall Cognitive Status: Impaired/Different from baseline Area of Impairment: Orientation, Following commands, Safety/judgement, Awareness, Problem solving, Memory                 Orientation Level: Disoriented to, Time, Situation, Place Current Attention Level: Sustained Memory: Decreased short-term memory Following Commands: Follows one step commands with increased time Safety/Judgement: Decreased awareness of safety, Decreased awareness of deficits Awareness: Intellectual Problem Solving: Slow processing, Decreased initiation, Difficulty sequencing, Requires verbal cues, Requires tactile cues General Comments: improved verbal responses, however oriented to name only. Unable to state her birthday and seems puzzled when given birthday. Follows  commands inconsistently        Exercises      General Comments        Pertinent Vitals/Pain Pain Assessment Pain Assessment: PAINAD Breathing: normal Negative Vocalization: none Facial Expression: smiling or inexpressive Body Language: relaxed Consolability: no need to console PAINAD Score: 0    Home Living                          Prior Function            PT Goals (current goals can now be found in the care plan section)  Acute Rehab PT Goals Patient Stated Goal: to feel better PT Goal Formulation: With patient Time For Goal Achievement: 10/01/23 Potential to Achieve Goals: Fair Progress towards PT goals: Progressing toward goals    Frequency    Min 1X/week      PT Plan      Co-evaluation              AM-PAC PT "6 Clicks" Mobility   Outcome Measure  Help needed turning from your back to your side while in a flat bed without using bedrails?: A Lot Help needed moving from lying on your back to sitting on the side of a flat bed without using bedrails?: A Lot Help needed moving to and from a bed to a chair (including a wheelchair)?: Total Help needed standing up from a chair using your arms (e.g., wheelchair or bedside chair)?: Total Help needed to walk in hospital room?: Total Help needed climbing 3-5 steps with a railing? : Total 6 Click Score: 8    End of Session   Activity Tolerance: Patient limited by fatigue Patient left: in bed;with call bell/phone within reach;with bed alarm set Nurse Communication: Mobility status;Other (comment) (patient's rectal tube leaking and patient is soiled.) PT Visit Diagnosis: Muscle weakness (generalized) (M62.81);Other abnormalities of gait and mobility (R26.89)     Time: 1308-6578 PT Time Calculation (min) (ACUTE ONLY): 16 min  Charges:    $Therapeutic Activity: 8-22 mins PT General Charges $$ ACUTE PT VISIT: 1 Visit                     Valorie Mcgrory, PT, GCS 09/29/23,11:28 AM

## 2023-09-29 NOTE — Plan of Care (Signed)
  Problem: Fluid Volume: Goal: Hemodynamic stability will improve Outcome: Progressing   Problem: Clinical Measurements: Goal: Diagnostic test results will improve Outcome: Progressing Goal: Signs and symptoms of infection will decrease Outcome: Progressing   Problem: Respiratory: Goal: Ability to maintain adequate ventilation will improve Outcome: Progressing   Problem: Education: Goal: Knowledge of General Education information will improve Description: Including pain rating scale, medication(s)/side effects and non-pharmacologic comfort measures Outcome: Progressing   Problem: Health Behavior/Discharge Planning: Goal: Ability to manage health-related needs will improve Outcome: Progressing   Problem: Clinical Measurements: Goal: Ability to maintain clinical measurements within normal limits will improve Outcome: Progressing Goal: Will remain free from infection Outcome: Progressing Goal: Diagnostic test results will improve Outcome: Progressing Goal: Respiratory complications will improve Outcome: Progressing Goal: Cardiovascular complication will be avoided Outcome: Progressing   Problem: Activity: Goal: Risk for activity intolerance will decrease Outcome: Progressing   Problem: Nutrition: Goal: Adequate nutrition will be maintained Outcome: Progressing   Problem: Coping: Goal: Level of anxiety will decrease Outcome: Progressing   Problem: Elimination: Goal: Will not experience complications related to bowel motility Outcome: Progressing Goal: Will not experience complications related to urinary retention Outcome: Progressing   Problem: Pain Management: Goal: General experience of comfort will improve Outcome: Progressing   Problem: Safety: Goal: Ability to remain free from injury will improve Outcome: Progressing   Problem: Skin Integrity: Goal: Risk for impaired skin integrity will decrease Outcome: Progressing   Problem: Education: Goal:  Ability to describe self-care measures that may prevent or decrease complications (Diabetes Survival Skills Education) will improve Outcome: Progressing Goal: Individualized Educational Video(s) Outcome: Progressing   Problem: Coping: Goal: Ability to adjust to condition or change in health will improve Outcome: Progressing   Problem: Fluid Volume: Goal: Ability to maintain a balanced intake and output will improve Outcome: Progressing   Problem: Health Behavior/Discharge Planning: Goal: Ability to identify and utilize available resources and services will improve Outcome: Progressing Goal: Ability to manage health-related needs will improve Outcome: Progressing

## 2023-09-29 NOTE — Progress Notes (Signed)
Progress Note   Patient: Kaitlyn Hodges [id number]:[id number] DOB: 07-09-1961 DOA: 09/04/2023     25 DOS: the patient was seen and examined on 09/29/2023   Brief hospital course: 62yo with h/o preDM, CVA, seizure d/o, achlasia/erosive esophagitis, and tobacco dependence who presented on 1/4 with AMS, SOB/cough, and n/v.  She was diagnosed with severe sepsis due to PNA with possible L parotiditis and developed shock associated with GI bleeding.  She was started on antibiotics, IVF, and transfused 1 unit PRBC.  She ultimately had PEG tube placement and had associated diarrhea from tube feeds.  She has required a rectal tube and will need improvement in diarrhea and removal of rectal tube prior to dc for SNF rehab.  Assessment and Plan:  Severe sepsis, resolved Patient presented with a multitude of symptoms, was diagnosed with severe sepsis due to CAP, and eventually improved from this standpoint post-antibiotics She was hypoxic on presentation but this has resolved and she is no longer requiring supplemental O2  Diarrhea Patient has had persistent diarrhea since placement of PEG tube and initiation of tube feeds Low current suspicion for infectious etiology Has rectal tube Possibly related to tube feeds Started on bulking agent with some thickening of stools Will remove rectal tube and see if she has ongoing improvement If not, consider transition to specialty tube feed (although this sometimes complicates placement)   B pleural effusions  s/p right-sided thoracentesis with removal of 600 mL of pleural fluid on 09/16/2023 Repeat chest x-ray shows improvement   Hemorrhagic shock, resolved  Secondary to acute GI bleed, gastritis, duodenitis, oozing duodenal ulcer with visible vessel as per EGD on 09/08/2023 and 1/15 Clips were placed and she was treated with argon plasma coagulation Continue PPI    Acute blood loss anemia  Due to GI bleeding, as above  S/p 3 units of pRBCs  No further apparent  bleeding and Hgb appears to be stable Continue to monitor CBC periodically  Thrombocytopenia, resolved S/p 5 units of platelets transfused this admission 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      Acute metabolic encephalopathy Labile mental status throughout hospitalization MRI brain 1/9 showed dural thickening versus thin subdural collection overlying the left cerebral hemisphere, measuring up to 3-4 mm in thickness Neurosurgery reviewed imaging - no indication for surgery EEG showed moderate diffuse encephalopathy   Pre-diabetes Continue on SSI w/ accuchecks    Closed wedge compression fracture of T3 vertebra Incidental finding on imaging S/p fall prior to admission Tylenol is controlling pain   Hx of CVA Not on aspirin or statin as per med rec    H/o Seizures Present since CVA in 2010 without further seizures since initial diagnosis EEG showed moderate diffuse encephalopathy Not on any seizures meds at home    Achalasia Chronic dysphagia Needs surgery but has yet to get this done.  Speech therapy consulted and there is NO safe diet at this point S/p PEG tube placement on 09/21/2023 On tube feeds   Generalized weakness PT/OT consulted, recommend SNF Has SNF bed and insurance authorization once medically stable   Malnutrition Nutrition Problem: Moderate Malnutrition Etiology: chronic illness Signs/Symptoms: mild fat depletion, moderate fat depletion, mild muscle depletion, moderate muscle depletion   Pressure ulcer   Pressure Injury 09/06/23 Buttocks Medial Stage 2 -  Partial thickness loss of dermis presenting as a shallow open injury with a red, pink wound bed without slough. small stage 2, skin breakdown at top of gluteal cleft (Active)  09/06/23 1827  Location: Buttocks  Location Orientation: Medial  Staging: Stage 2 -  Partial thickness loss of dermis presenting as a shallow open injury with a red, pink wound bed without slough.  Wound Description (Comments):  small stage 2, skin breakdown at top of gluteal cleft  Present on Admission: Yes     Pressure Injury 09/16/23 Buttocks Left Stage 2 -  Partial thickness loss of dermis presenting as a shallow open injury with a red, pink wound bed without slough. (Active)  09/16/23 0745  Location: Buttocks  Location Orientation: Left  Staging: Stage 2 -  Partial thickness loss of dermis presenting as a shallow open injury with a red, pink wound bed without slough.  Wound Description (Comments):   Present on Admission: No      Consultants: GI Neurosurgery PCCM Palliative care Oncology PT OT SLP Nutrition TOC team  Procedures: EGD 1/8 EEG 1/9 EGD 1/15 PEG tube 1/21  Antibiotics: Cefazolin x 2 doses Cefepime 1/4-8, 1/11-14 Ceftriaxone 1/5-8 Doxycycline 1/7-8 Metronidazole 1/4-8 Zosyn 1/8-11 Vancomycin 1/4-9  30 Day Unplanned Readmission Risk Score    Flowsheet Row ED to Hosp-Admission (Current) from 09/04/2023 in [facility]  30 Day Unplanned Readmission Risk Score (%) 12.13 Filed at 09/29/2023 0801       This score is the patient's risk of an unplanned readmission within 30 days of being discharged (0 -100%). The score is based on dignosis, age, lab data, medications, orders, and past utilization.   Low:  0-14.9   Medium: 15-21.9   High: 22-29.9   Extreme: 30 and above           Subjective: Mildly confused but generally appropriate.  Thinks her stooling is improving.  No specific complaints.   Objective: Vitals:   09/29/23 0817 09/29/23 1550  BP: 101/61 113/67  Pulse: 95 100  Resp: 18 18  Temp: 98.5 F (36.9 C) 98.3 F (36.8 C)  SpO2: 100% 100%    Intake/Output Summary (Last 24 hours) at 09/29/2023 1726 Last data filed at 09/29/2023 1500 Gross per 24 hour  Intake 1047.75 ml  Output 1450 ml  Net -402.25 ml   Filed Weights   09/27/23 0420 09/28/23 0442 09/29/23 0500  Weight: 72.1 kg 75.1 kg 76.5 kg    Exam:  General:   Appears calm and comfortable and is in NAD Eyes:  EOMI, normal lids, iris ENT:  grossly normal hearing, lips & tongue, mmm Neck:  no LAD, masses or thyromegaly Cardiovascular:  RRR, no m/r/g. No LE edema.  Respiratory:   CTA bilaterally with no wheezes/rales/rhonchi.  Normal respiratory effort. Abdomen:  soft, NT, ND; PEG tube in place Skin:  no rash or induration seen on limited exam Musculoskeletal:  grossly normal tone BUE/BLE, good ROM, no bony abnormality Psychiatric:  mildly confused mood and affect, speech fluent but slow, AOx2, apparent MCI Neurologic:  CN 2-12 grossly intact, moves all extremities in coordinated fashion  Data Reviewed: I have reviewed the patient's lab results since admission.  Pertinent labs for today include:   None today     Family Communication: None present  Disposition: Status is: Inpatient Remains inpatient appropriate because: ongoing management     Time spent: 50 minutes  Unresulted Labs (From admission, onward)     Start     Ordered   09/30/23 0500  CBC with Differential/Platelet  Tomorrow morning,   R       Question:  Specimen collection method  Answer:  Lab=Lab collect   09/29/23 0911  09/30/23 0500  Basic metabolic panel  Tomorrow morning,   R       Question:  Specimen collection method  Answer:  Lab=Lab collect   09/29/23 0911             Author: Jonah Blue, MD 09/29/2023 5:26 PM  For on call review [email or web address].

## 2023-09-29 NOTE — Plan of Care (Signed)
  Problem: Fluid Volume: Goal: Hemodynamic stability will improve Outcome: Progressing   Problem: Clinical Measurements: Goal: Diagnostic test results will improve Outcome: Progressing Goal: Signs and symptoms of infection will decrease Outcome: Progressing   Problem: Respiratory: Goal: Ability to maintain adequate ventilation will improve Outcome: Progressing   Problem: Education: Goal: Knowledge of General Education information will improve Description: Including pain rating scale, medication(s)/side effects and non-pharmacologic comfort measures Outcome: Progressing   Problem: Health Behavior/Discharge Planning: Goal: Ability to manage health-related needs will improve Outcome: Progressing   Problem: Clinical Measurements: Goal: Ability to maintain clinical measurements within normal limits will improve Outcome: Progressing Goal: Will remain free from infection Outcome: Progressing Goal: Diagnostic test results will improve Outcome: Progressing Goal: Respiratory complications will improve Outcome: Progressing Goal: Cardiovascular complication will be avoided Outcome: Progressing   Problem: Activity: Goal: Risk for activity intolerance will decrease Outcome: Progressing   Problem: Nutrition: Goal: Adequate nutrition will be maintained Outcome: Progressing   Problem: Coping: Goal: Level of anxiety will decrease Outcome: Progressing   Problem: Elimination: Goal: Will not experience complications related to bowel motility Outcome: Progressing Goal: Will not experience complications related to urinary retention Outcome: Progressing   Problem: Pain Management: Goal: General experience of comfort will improve Outcome: Progressing   Problem: Safety: Goal: Ability to remain free from injury will improve Outcome: Progressing   Problem: Skin Integrity: Goal: Risk for impaired skin integrity will decrease Outcome: Progressing   Problem: Education: Goal:  Ability to describe self-care measures that may prevent or decrease complications (Diabetes Survival Skills Education) will improve Outcome: Progressing Goal: Individualized Educational Video(s) Outcome: Progressing   Problem: Coping: Goal: Ability to adjust to condition or change in health will improve Outcome: Progressing   Problem: Fluid Volume: Goal: Ability to maintain a balanced intake and output will improve Outcome: Progressing   Problem: Health Behavior/Discharge Planning: Goal: Ability to identify and utilize available resources and services will improve Outcome: Progressing Goal: Ability to manage health-related needs will improve Outcome: Progressing   Problem: Metabolic: Goal: Ability to maintain appropriate glucose levels will improve Outcome: Progressing   Problem: Nutritional: Goal: Maintenance of adequate nutrition will improve Outcome: Progressing Goal: Progress toward achieving an optimal weight will improve Outcome: Progressing   Problem: Skin Integrity: Goal: Risk for impaired skin integrity will decrease Outcome: Progressing   Problem: Tissue Perfusion: Goal: Adequacy of tissue perfusion will improve Outcome: Progressing

## 2023-09-29 NOTE — Hospital Course (Signed)
62yo with h/o preDM, CVA, seizure d/o, achlasia/erosive esophagitis, and tobacco dependence who presented on 1/4 with AMS, SOB/cough, and n/v.  She was diagnosed with severe sepsis due to PNA with possible L parotiditis and developed shock associated with GI bleeding.  She was started on antibiotics, IVF, and transfused 1 unit PRBC.  She ultimately had PEG tube placement and had associated diarrhea from tube feeds.  She has required a rectal tube and will need improvement in diarrhea and removal of rectal tube prior to dc for SNF rehab.

## 2023-09-30 DIAGNOSIS — R6520 Severe sepsis without septic shock: Secondary | ICD-10-CM | POA: Diagnosis not present

## 2023-09-30 DIAGNOSIS — A419 Sepsis, unspecified organism: Secondary | ICD-10-CM | POA: Diagnosis not present

## 2023-09-30 LAB — BASIC METABOLIC PANEL
Anion gap: 9 (ref 5–15)
BUN: 37 mg/dL — ABNORMAL HIGH (ref 8–23)
CO2: 25 mmol/L (ref 22–32)
Calcium: 8.8 mg/dL — ABNORMAL LOW (ref 8.9–10.3)
Chloride: 104 mmol/L (ref 98–111)
Creatinine, Ser: 0.57 mg/dL (ref 0.44–1.00)
GFR, Estimated: >60 mL/min (ref >60)
Glucose, Bld: 110 mg/dL — ABNORMAL HIGH (ref 70–99)
Potassium: 4.5 mmol/L (ref 3.5–5.1)
Sodium: 138 mmol/L (ref 135–145)

## 2023-09-30 LAB — CBC WITH DIFFERENTIAL/PLATELET
Abs Immature Granulocytes: 0.11 10*3/uL — ABNORMAL HIGH (ref 0.00–0.07)
Basophils Absolute: 0.1 10*3/uL (ref 0.0–0.1)
Basophils Relative: 1 %
Eosinophils Absolute: 0.3 10*3/uL (ref 0.0–0.5)
Eosinophils Relative: 4 %
HCT: 27.3 % — ABNORMAL LOW (ref 36.0–46.0)
Hemoglobin: 9 g/dL — ABNORMAL LOW (ref 12.0–15.0)
Immature Granulocytes: 1 %
Lymphocytes Relative: 18 %
Lymphs Abs: 1.5 10*3/uL (ref 0.7–4.0)
MCH: 31.1 pg (ref 26.0–34.0)
MCHC: 33 g/dL (ref 30.0–36.0)
MCV: 94.5 fL (ref 80.0–100.0)
Monocytes Absolute: 0.8 10*3/uL (ref 0.1–1.0)
Monocytes Relative: 9 %
Neutro Abs: 5.5 10*3/uL (ref 1.7–7.7)
Neutrophils Relative %: 67 %
Platelets: 302 10*3/uL (ref 150–400)
RBC: 2.89 MIL/uL — ABNORMAL LOW (ref 3.87–5.11)
RDW: 15.5 % (ref 11.5–15.5)
WBC: 8.3 10*3/uL (ref 4.0–10.5)
nRBC: 0 % (ref 0.0–0.2)

## 2023-09-30 LAB — GLUCOSE, CAPILLARY
Glucose-Capillary: 117 mg/dL — ABNORMAL HIGH (ref 70–99)
Glucose-Capillary: 134 mg/dL — ABNORMAL HIGH (ref 70–99)
Glucose-Capillary: 107 mg/dL — ABNORMAL HIGH (ref 70–99)

## 2023-09-30 MED ORDER — OSMOLITE 1.5 CAL PO LIQD: 1000.0000 mL | ORAL | Status: DC

## 2023-09-30 MED ORDER — JUVEN PO PACK: 1.0000 | PACK | Freq: Two times a day (BID) | ORAL | Status: DC

## 2023-09-30 MED ORDER — NUTRISOURCE FIBER PO PACK: 1.0000 | PACK | Freq: Three times a day (TID) | ORAL | Status: DC

## 2023-09-30 MED ORDER — ZINC OXIDE 40 % EX OINT: TOPICAL_OINTMENT | CUTANEOUS | Status: DC | PRN

## 2023-09-30 MED ORDER — PANTOPRAZOLE SODIUM 40 MG PO TBEC: 40.0000 mg | DELAYED_RELEASE_TABLET | Freq: Every day | ORAL | Status: DC

## 2023-09-30 MED ORDER — ACETAMINOPHEN 325 MG PO TABS: 650.0000 mg | ORAL_TABLET | Freq: Four times a day (QID) | ORAL | Status: DC | PRN

## 2023-09-30 MED ORDER — FREE WATER: 200.0000 mL | Freq: Four times a day (QID) | Status: DC

## 2023-09-30 MED ORDER — CYANOCOBALAMIN 1000 MCG PO TABS: 1000.0000 ug | ORAL_TABLET | Freq: Every day | ORAL | Status: DC

## 2023-09-30 NOTE — TOC Transition Note (Addendum)
Transition of Care [facility]) - Discharge Note   Patient Details  Name: Kaitlyn Hodges MRN: [id number] Date of Birth: 1961-01-08  [facility]) CM/SW Contact:  Lorri Frederick, LCSW Phone Number: 09/30/2023, 1:27 PM   Clinical Narrative:   Pt discharging to [facility].  RN call report to [phone].   1415: TC [facility]: they are not able to transport to [place] today.    CSW called [facility].  They are not able to transport either.  CSW called [facility].  They can transport, 8pm pickup, but would need an authorization from [employer] [phone].  CSW did call [employer], long referral process and they said they would reach out to [facility].   They confirmed this transportation will happen today.     Final next level of care: Skilled Nursing Facility Barriers to Discharge: Barriers Resolved   Patient Goals and CMS Choice       [place] ownership interest in [facility].provided to:: Adult Children    Discharge Placement              Patient chooses bed at:  [facility]) Patient to be transferred to facility by: [facility] Name of family member notified: son Rocky Link Patient and family notified of of transfer: 09/30/23  Discharge Plan and Services Additional resources added to the After Visit Summary for       Post Acute Care Choice: Skilled Nursing Facility                               Social Drivers of Health (SDOH) Interventions SDOH Screenings   Food Insecurity: No Food Insecurity (09/06/2023)  Housing: Low Risk  (09/06/2023)  Transportation Needs: No Transportation Needs (09/06/2023)  Utilities: Not At Risk (09/06/2023)  Alcohol Screen: Low Risk  (03/21/2021)  Depression (PHQ2-9): Medium Risk (03/21/2021)  Tobacco Use: High Risk (09/21/2023)     Readmission Risk Interventions     No data to display

## 2023-09-30 NOTE — TOC Progression Note (Signed)
Transition of Care [facility]) - Progression Note    Patient Details  Name: Kaitlyn Hodges MRN: [id number] Date of Birth: Nov 21, 1960  [facility]) CM/SW Contact  Lorri Frederick, LCSW Phone Number: 09/30/2023, 12:32 PM  Clinical Narrative:   CSW spoke with [facility], she can receive pt today.  [facility] does have medicaid auth in place.  MD informed.     Expected Discharge Plan: Skilled Nursing Facility Barriers to Discharge: Continued Medical Work up, English as a second language teacher, SNF Pending bed offer  Expected Discharge Plan and Services     Post Acute Care Choice: Skilled Nursing Facility                                         Social Determinants of Health (SDOH) Interventions SDOH Screenings   Food Insecurity: No Food Insecurity (09/06/2023)  Housing: Low Risk  (09/06/2023)  Transportation Needs: No Transportation Needs (09/06/2023)  Utilities: Not At Risk (09/06/2023)  Alcohol Screen: Low Risk  (03/21/2021)  Depression (PHQ2-9): Medium Risk (03/21/2021)  Tobacco Use: High Risk (09/21/2023)    Readmission Risk Interventions     No data to display

## 2023-09-30 NOTE — Plan of Care (Signed)
  Problem: Respiratory: Goal: Ability to maintain adequate ventilation will improve Outcome: Progressing   Problem: Clinical Measurements: Goal: Signs and symptoms of infection will decrease Outcome: Progressing   Problem: Clinical Measurements: Goal: Will remain free from infection Outcome: Progressing   Problem: Clinical Measurements: Goal: Ability to maintain clinical measurements within normal limits will improve Outcome: Progressing   Problem: Clinical Measurements: Goal: Will remain free from infection Outcome: Progressing

## 2023-09-30 NOTE — Progress Notes (Signed)
Pt discharged [facility] with no acute changes. Bedside report given to [facility]. [facility] did not give report d/t facility did not answer phone.

## 2023-09-30 NOTE — Discharge Summary (Signed)
Physician Discharge Summary   Patient: Kaitlyn Hodges MRN: [id number] DOB: [birth year]  Admit date:     09/04/2023  Discharge date: 09/30/23  Discharge Physician: Jonah Blue   PCP: Malva Limes, MD   Recommendations at discharge:   You are being discharged to [facility] for rehabilitation Continue tube feeds and strict NPO status for now Recheck CBC in 1 week Follow up with Dr. Sherrie Mustache after discharge from rehab  Discharge Diagnoses: Principal Problem:   Severe sepsis [facility]) Active Problems:   Acute encephalopathy   Achalasia   Pre-diabetes   Seizure disorder (HCC)   History of CVA (cerebrovascular accident)   Closed wedge compression fracture of T3 vertebra (HCC)   Hemorrhagic shock (HCC)   Pressure injury of skin   Malnutrition of moderate degree   Upper GI bleed   Thrombocytopenia (HCC)   Oropharyngeal dysphagia   Hospital Course: 62yo with h/o preDM, CVA, seizure d/o, achlasia/erosive esophagitis, and tobacco dependence who presented on 1/4 with AMS, SOB/cough, and n/v.  She was diagnosed with severe sepsis due to PNA with possible L parotiditis and developed shock associated with GI bleeding.  She was started on antibiotics, IVF, and transfused 1 unit PRBC.  She ultimately had PEG tube placement and had associated diarrhea from tube feeds.  She has required a rectal tube and will need improvement in diarrhea and removal of rectal tube prior to dc for SNF rehab.  Assessment and Plan:   Severe sepsis, resolved Patient presented with a multitude of symptoms, was diagnosed with severe sepsis due to CAP, and eventually improved from this standpoint post-antibiotics She was hypoxic on presentation but this has resolved and she is no longer requiring supplemental O2   Diarrhea Patient has had persistent diarrhea since placement of PEG tube and initiation of tube feeds Low current suspicion for infectious etiology Possibly related to tube feeds Started on bulking agent  with some thickening of stools Had rectal tube but that was discontinued on 1/29 and she has had one formed stool since  B pleural effusions  s/p right-sided thoracentesis with removal of 600 mL of pleural fluid on 09/16/2023 Repeat chest x-ray showed improvement    Hemorrhagic shock, resolved  Secondary to acute GI bleed, gastritis, duodenitis, oozing duodenal ulcer with visible vessel as per EGD on 09/08/2023 and 1/15 Clips were placed and she was treated with argon plasma coagulation Continue PPI    Acute blood loss anemia  Due to GI bleeding, as above  S/p 3 units of pRBCs  No further apparent bleeding and Hgb appears to be stable Continue to monitor CBC periodically as an outpatient   Thrombocytopenia, resolved S/p 5 units of platelets transfused this admission 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      Acute metabolic encephalopathy Labile mental status throughout hospitalization MRI brain 1/9 showed dural thickening versus thin subdural collection overlying the left cerebral hemisphere, measuring up to 3-4 mm in thickness Neurosurgery reviewed imaging - no indication for surgery EEG showed moderate diffuse encephalopathy   Pre-diabetes Continue on SSI w/ accuchecks    Closed wedge compression fracture of T3 vertebra Incidental finding on imaging S/p fall prior to admission Tylenol is controlling pain   Hx of CVA Not on aspirin or statin as per med rec    H/o Seizures Present since CVA in 2010 without further seizures since initial diagnosis EEG showed moderate diffuse encephalopathy Not on any seizures meds at home    Achalasia Chronic dysphagia Needs surgery but has yet  to get this done.  Speech therapy consulted and there is NO safe diet at this point S/p PEG tube placement on 09/21/2023 On tube feeds   Generalized weakness PT/OT consulted, recommend SNF Has SNF bed and insurance authorization once medically stable  Discharging to facility today    Malnutrition Nutrition Problem: Moderate Malnutrition Etiology: chronic illness Signs/Symptoms: mild fat depletion, moderate fat depletion, mild muscle depletion, moderate muscle depletion  Pressure ulcer   Pressure Injury 09/06/23 Buttocks Medial Stage 2 -  Partial thickness loss of dermis presenting as a shallow open injury with a red, pink wound bed without slough. small stage 2, skin breakdown at top of gluteal cleft (Active)  09/06/23 1827  Location: Buttocks  Location Orientation: Medial  Staging: Stage 2 -  Partial thickness loss of dermis presenting as a shallow open injury with a red, pink wound bed without slough.  Wound Description (Comments): small stage 2, skin breakdown at top of gluteal cleft  Present on Admission: Yes     Pressure Injury 09/16/23 Buttocks Left Stage 2 -  Partial thickness loss of dermis presenting as a shallow open injury with a red, pink wound bed without slough. (Active)  09/16/23 0745  Location: Buttocks  Location Orientation: Left  Staging: Stage 2 -  Partial thickness loss of dermis presenting as a shallow open injury with a red, pink wound bed without slough.  Wound Description (Comments):   Present on Admission: No        Consultants: GI Neurosurgery PCCM Palliative care Oncology PT OT SLP Nutrition TOC team   Procedures: EGD 1/8 EEG 1/9 EGD 1/15 PEG tube 1/21   Antibiotics: Cefazolin x 2 doses Cefepime 1/4-8, 1/11-14 Ceftriaxone 1/5-8 Doxycycline 1/7-8 Metronidazole 1/4-8 Zosyn 1/8-11 Vancomycin 1/4-9   Pain control - [employer] Controlled Substance Reporting System database was reviewed. and patient was instructed, not to drive, operate heavy machinery, perform activities at heights, swimming or participation in water activities or provide baby-sitting services while on Pain, Sleep and Anxiety Medications; until their outpatient Physician has advised to do so again. Also recommended to not to take more than prescribed  Pain, Sleep and Anxiety Medications.   Disposition: Skilled nursing facility Diet recommendation:  NPO   DISCHARGE MEDICATION: Allergies as of 09/30/2023   No Known Allergies      Medication List     STOP taking these medications    amoxicillin-clavulanate 875-125 MG tablet Commonly known as: Augmentin   benzonatate 100 MG capsule Commonly known as: TESSALON   naproxen 500 MG tablet Commonly known as: Naprosyn   predniSONE 10 MG tablet Commonly known as: DELTASONE       TAKE these medications    acetaminophen 325 MG tablet Commonly known as: TYLENOL Place 2 tablets (650 mg total) into feeding tube every 6 (six) hours as needed for mild pain (pain score 1-3) (or Fever >/= 101).   cyanocobalamin 1000 MCG tablet Place 1 tablet (1,000 mcg total) into feeding tube daily. Start taking on: October 01, 2023   feeding supplement (OSMOLITE 1.5 CAL) Liqd Place 1,000 mLs into feeding tube continuous.   nutrition supplement (JUVEN) Pack Place 1 packet into feeding tube 2 (two) times daily between meals.   fiber Pack packet Place 1 packet into feeding tube 3 (three) times daily.   free water Soln Place 200 mLs into feeding tube every 6 (six) hours.   liver oil-zinc oxide 40 % ointment Commonly known as: DESITIN Apply topically as needed for irritation.   pantoprazole  40 MG tablet Commonly known as: Protonix Take 1 tablet (40 mg total) by mouth daily. Per tube               Discharge Care Instructions  (From admission, onward)           Start     Ordered   09/30/23 0000  Discharge wound care:       Comments: Use Desitin to buttocks wounds as needed and follow pressure-relieving protocols   09/30/23 1247            Contact information for after-discharge care     Destination     [facility] .   Service: Skilled Nursing Contact information: [address] [place] [address] [phone]                     Discharge Exam:   Subjective: No specific complaints.  1 BM this AM that was formed, happy to have rectal tube out.   Objective: Vitals:   09/30/23 0419 09/30/23 0733  BP: 114/70 108/68  Pulse: 92 94  Resp: 16 16  Temp: 98.2 F (36.8 C) 97.7 F (36.5 C)  SpO2: 100% 100%    Intake/Output Summary (Last 24 hours) at 09/30/2023 1247 Last data filed at 09/30/2023 0444 Gross per 24 hour  Intake 1061.5 ml  Output 1700 ml  Net -638.5 ml   Filed Weights   09/28/23 0442 09/29/23 0500 09/30/23 0444  Weight: 75.1 kg 76.5 kg 74.8 kg    Exam:  General:  Appears calm and comfortable and is in NAD Eyes:  EOMI, normal lids, iris ENT:  grossly normal hearing, lips & tongue, mmm Neck:  no LAD, masses or thyromegaly Cardiovascular:  RRR, no m/r/g. No LE edema.  Respiratory:   CTA bilaterally with no wheezes/rales/rhonchi.  Normal respiratory effort. Abdomen:  soft, NT, ND; PEG tube in place Skin:  no rash or induration seen on limited exam Musculoskeletal:  grossly normal tone BUE/BLE, good ROM, no bony abnormality Psychiatric:  mildly confused mood and affect, speech fluent but slow, AOx2, apparent MCI Neurologic:  CN 2-12 grossly intact, moves all extremities in coordinated fashion  Data Reviewed: I have reviewed the patient's lab results since admission.  Pertinent labs for today include:   Glucose 110 BUN 37/Creatinine 0.57/GFR >60 WBC 8.3 Hgb 9    Condition at discharge: stable  The results of significant diagnostics from this hospitalization (including imaging, microbiology, ancillary and laboratory) are listed below for reference.   Imaging Studies: DG Chest Port 1 View Result Date: 09/16/2023 CLINICAL DATA:  Status post thoracentesis EXAM: PORTABLE CHEST 1 VIEW COMPARISON:  X-ray 09/12/2023 and older FINDINGS: Enteric tube in place extending beneath the diaphragm. Stable enlarged cardiopericardial silhouette with calcified tortuous aorta.  Vascular congestion is improving. Pleural effusions are improving particularly on the right. No pneumothorax post thoracentesis. Persistent left retrocardiac opacity. Overlapping cardiac leads. IMPRESSION: Improving pleural effusions and opacities.  No pneumothorax Electronically Signed   By: Karen Kays M.D.   On: 09/16/2023 15:39   US THORACENTESIS ASP PLEURAL SPACE W/IMG GUIDE Result Date: 09/16/2023 INDICATION: 63 year old female with cough, shortness of breath, bilateral pleural effusions. Request made for thoracentesis. EXAM: ULTRASOUND GUIDED RIGHT DIAGNOSTIC AND THERAPEUTIC THORACENTESIS MEDICATIONS: 10 mL 1% lidocaine COMPLICATIONS: None immediate. PROCEDURE: An ultrasound guided thoracentesis was thoroughly discussed with the patient and questions answered. The benefits, risks, alternatives and complications were also discussed. The patient understands and wishes  to proceed with the procedure. Written consent was obtained. Ultrasound was performed to localize and mark an adequate pocket of fluid in the right chest. The area was then prepped and draped in the normal sterile fashion. 1% Lidocaine was used for local anesthesia. Under ultrasound guidance a 6 Fr Safe-T-Centesis catheter was introduced. Thoracentesis was performed. The catheter was removed and a dressing applied. FINDINGS: A total of approximately 600 mL of yellow serous fluid was removed. Samples were sent to the laboratory as requested by the clinical team. IMPRESSION: Successful ultrasound guided diagnostic and therapeutic right thoracentesis yielding 600 mL of pleural fluid. Performed by: Loyce Dys PA-C Electronically Signed   By: Simonne Come M.D.   On: 09/16/2023 15:39   US Venous Img Lower Bilateral (DVT) Result Date: 09/13/2023 CLINICAL DATA:  Elevated D-dimer. EXAM: BILATERAL LOWER EXTREMITY VENOUS DOPPLER ULTRASOUND TECHNIQUE: Gray-scale sonography with compression, as well as color and duplex ultrasound, were performed to  evaluate the deep venous system(s) from the level of the common femoral vein through the popliteal and proximal calf veins. COMPARISON:  None Available. FINDINGS: VENOUS Normal compressibility of the common femoral, superficial femoral, and popliteal veins, as well as the visualized calf veins. Visualized portions of profunda femoral vein and great saphenous vein unremarkable. No filling defects to suggest DVT on grayscale or color Doppler imaging. Doppler waveforms show normal direction of venous flow, normal respiratory plasticity and response to augmentation. OTHER None. Limitations: none IMPRESSION: No evidence of DVT in either lower extremity. Electronically Signed   By: Narda Rutherford M.D.   On: 09/13/2023 15:40   CT Angio Chest Pulmonary Embolism (PE) W or WO Contrast Result Date: 09/13/2023 CLINICAL DATA:  Pulmonary embolism (PE) suspected, high prob high d-dimer, hypoxia EXAM: CT ANGIOGRAPHY CHEST WITH CONTRAST TECHNIQUE: Multidetector CT imaging of the chest was performed using the standard protocol during bolus administration of intravenous contrast. Multiplanar CT image reconstructions and MIPs were obtained to evaluate the vascular anatomy. RADIATION DOSE REDUCTION: This exam was performed according to the departmental dose-optimization program which includes automated exposure control, adjustment of the mA and/or kV according to patient size and/or use of iterative reconstruction technique. CONTRAST:  75mL OMNIPAQUE IOHEXOL 350 MG/ML SOLN COMPARISON:  Radiograph yesterday.  Chest CT 09/04/2023 FINDINGS: Cardiovascular: There are no filling defects within the pulmonary arteries to suggest pulmonary embolus. Mild cardiomegaly. There are coronary artery calcifications. Normal caliber thoracic aorta without acute aortic findings. Mild atherosclerosis. Contrast refluxes into the left jugular vein. Trace pericardial effusion. Mediastinum/Nodes: Esophageal dilatation consistent with known achalasia.  Enteric tube tip below the diaphragm not included in this chest field of view. Mild fluid within the mid and distal esophagus, although diminished from prior exam. No bulky adenopathy. Lungs/Pleura: New moderate to large bilateral pleural effusions. Associated compressive atelectasis including complete atelectasis of left lower lobe and subtotal atelectasis of the right lower lobe. Geographic areas of ground-glass within the aerated upper lobes with mild septal thickening are suspicious for pulmonary edema, although multifocal pneumonia is also considered. There is scattered debris within the trachea and central airways. Upper Abdomen: Small amount of upper abdominal ascites. Suggestion of gallbladder wall thickening. Musculoskeletal: Stable osseous structures. Unchanged T3 superior endplate compression deformity. Body wall edema is most prominent inferiorly extending into the flanks. Review of the MIP images confirms the above findings. IMPRESSION: 1. No pulmonary embolus. 2. New moderate to large bilateral pleural effusions with associated compressive atelectasis including complete atelectasis of the left lower lobe and subtotal atelectasis of the  right lower lobe. 3. Geographic areas of ground-glass within the aerated upper lobes with mild septal thickening are suspicious for pulmonary edema, although multifocal pneumonia is also considered. 4. Mild cardiomegaly with coronary artery calcifications. 5. Esophageal dilatation consistent with known achalasia. 6. Small amount of upper abdominal ascites. Suggestion of gallbladder wall thickening, nonspecific in this setting. 7. Body wall edema. Aortic Atherosclerosis (ICD10-I70.0). Electronically Signed   By: Narda Rutherford M.D.   On: 09/13/2023 15:39   DG Abd 1 View Result Date: 09/13/2023 CLINICAL DATA:  63 year old female status post nasogastric tube placement. EXAM: ABDOMEN - 1 VIEW COMPARISON:  09/09/2023. FINDINGS: Nasogastric tube extending into the stomach  with side port approximately 9 cm distal to the gastroesophageal junction, and tip in the distal body of the stomach. Gas and stool are seen scattered throughout the colon extending to the level of the distal rectum. No pathologic distension of small bowel is noted. No gross evidence of pneumoperitoneum. IMPRESSION: 1. Support apparatus, as above. 2. Nonobstructive bowel-gas pattern. Electronically Signed   By: Trudie Reed M.D.   On: 09/13/2023 07:05   DG Chest Port 1 View Result Date: 09/12/2023 CLINICAL DATA:  242050 Oxygen desaturation 161096 EXAM: PORTABLE CHEST 1 VIEW COMPARISON:  09/08/2023 FINDINGS: Cardiomegaly. Aortic atherosclerosis. Enteric tube courses below the diaphragm with distal tip beyond the inferior margin of the film. Diffuse bilateral interstitial opacities, overall progressed. Previously seen airspace consolidation in the right perihilar region is improved. Probable small bilateral pleural effusions. No pneumothorax. IMPRESSION: 1. Diffuse bilateral interstitial opacities, overall progressed. Previously seen airspace consolidation in the right perihilar region is improved. 2. Probable small bilateral pleural effusions. Electronically Signed   By: Duanne Guess D.O.   On: 09/12/2023 16:15   DG Abd 1 View Result Date: 09/09/2023 CLINICAL DATA:  NG tube placement EXAM: ABDOMEN - 1 VIEW COMPARISON:  09/09/2023 FINDINGS: NG tube tip is in the stomach. Nonobstructive bowel gas pattern. Right basilar opacity, likely atelectasis. IMPRESSION: NG tube tip in the stomach. Electronically Signed   By: Charlett Nose M.D.   On: 09/09/2023 19:53   DG Abd 1 View Result Date: 09/09/2023 CLINICAL DATA:  Check gastric catheter placement EXAM: ABDOMEN - 1 VIEW COMPARISON:  None Available. FINDINGS: Gastric catheter is noted within the stomach. Proximal side port is at the gastroesophageal junction. This could be advanced deeper into the stomach. IMPRESSION: Gastric catheter as described. This should  be advanced deeper into the stomach. Electronically Signed   By: Alcide Clever M.D.   On: 09/09/2023 18:31   EEG adult Result Date: 09/09/2023 Charlsie Quest, MD     09/09/2023  4:39 PM Patient Name: Sharanya Templin MRN: [id number] Epilepsy Attending: Charlsie Quest Referring Physician/Provider: Ezequiel Essex, NP Date: 09/09/2023 Duration: 36.29 mins Patient history: 63yo F with Left MCA stroks and ams getting eeg to evaluate for seizure Level of alertness: Awake AEDs during EEG study: None Technical aspects: This EEG study was done with scalp electrodes positioned according to the 10-20 International system of electrode placement. Electrical activity was reviewed with band pass filter of 1-70Hz , sensitivity of 7 uV/mm, display speed of 57mm/sec with a 60Hz  notched filter applied as appropriate. EEG data were recorded continuously and digitally stored.  Video monitoring was available and reviewed as appropriate. Description: EEG showed continuous generalized polymorphic 3 to 6 Hz theta-delta slowing. Hyperventilation and photic stimulation were not performed.   ABNORMALITY - Continuous slow, generalized IMPRESSION: This study is suggestive of moderate diffuse encephalopathy. No seizures  or epileptiform discharges were seen throughout the recording. Charlsie Quest   MR BRAIN W CONTRAST Result Date: 09/09/2023 CLINICAL DATA:  Altered mental status. Abnormal MRI. Possible subdural collection. EXAM: MRI HEAD WITH CONTRAST TECHNIQUE: Multiplanar, multiecho pulse sequences of the brain and surrounding structures were obtained with intravenous contrast. CONTRAST:  7mL GADAVIST GADOBUTROL 1 MMOL/ML IV SOLN COMPARISON:  MR head without contrast 09/09/2023 FINDINGS: Brain: This study is again moderately degraded by patient motion. Remote left MCA territory infarcts are present. Mild dural thickening and enhancement is present over the temporal and posterior frontal lobes bilaterally. No significant extra-axial collections  are present. No parenchymal enhancement is present. Vascular: Normal vascular enhancement present. Skull and upper cervical spine: The craniocervical junction is normal. Upper cervical spine is within normal limits. Marrow signal is unremarkable. Sinuses/Orbits: The paranasal sinuses and mastoid air cells are clear. Bilateral lens replacements are noted. Globes and orbits are otherwise unremarkable. IMPRESSION: 1. Mild dural thickening and enhancement over the temporal and posterior frontal lobes bilaterally. This is a nonspecific finding and may be related to recent surgery or trauma. No focal collection is present to suggest infection. 2. Remote left MCA territory infarcts. Electronically Signed   By: Marin Roberts M.D.   On: 09/09/2023 13:58   MR BRAIN WO CONTRAST Addendum Date: 09/09/2023 ADDENDUM REPORT: 09/09/2023 12:34 ADDENDUM: Impression #3 called by telephone at the time of interpretation on 09/09/2023 at 12:16 pm to provider DANA NELSON , who verbally acknowledged these results. Electronically Signed   By: Jackey Loge D.O.   On: 09/09/2023 12:34   Result Date: 09/09/2023 CLINICAL DATA:  Provided history: Mental status change, unknown cause. EXAM: MRI HEAD WITHOUT CONTRAST TECHNIQUE: Multiplanar, multiecho pulse sequences of the brain and surrounding structures were obtained without intravenous contrast. COMPARISON:  Head CT 09/04/2023.  Brain MRI 05/02/2009. FINDINGS: Multiple sequences are significantly motion degraded. Most notably, the axial T2 sequence is severely motion degraded, the axial FLAIR sequence is severely motion degraded and the axial T1 sequence is severely motion degraded. Within this limitation, findings are as follows. Brain: Mild generalized cerebral atrophy. Chronic left MCA territory infarcts within the cortical/subcortical left frontal and parietal lobes, and within the left basal ganglia. Additional chronic left MCA territory cortical/subcortical infarct at the junction  of the left temporal, parietal and occipital lobes. Chronic hemosiderin deposition associated with some of these infarcts. Small chronic cortically-based infarct more posteriorly within the left parietal lobe (PCA territory). Chronic lacunar infarct within the right thalamus. Background mild patchy and ill-defined hypoattenuation within the cerebral white matter, nonspecific but compatible chronic small vessel ischemic disease. Dural thickening versus thin subdural collection overlying the left cerebral hemisphere, measuring up to 3-4 mm in thickness (for instance as seen on series 9, image 29). Dural thickening versus trace subdural collection overlying the right cerebral hemisphere (measuring up to 2 mm in thickness (for instance as seen on series 9, image 29 There is no acute infarct. Small focus of diffusion-weighted signal hyperintensity within the right frontal lobe centrum semiovale (with corresponding T2 shine through on the ADC map) (series 5, image 35). No evidence of an intracranial mass. No midline shift. Vascular: Within the limitations of significant motion degradation, no definite loss of proximal arterial flow voids. Skull and upper cervical spine: Within limitations of motion degradation, no focal worrisome marrow lesion. Incompletely assessed cervical spondylosis. Sinuses/Orbits: Within limitations of motion degradation, no acute orbital finding. 16 mm mucous retention cyst within the right maxillary sinus. Attempts are being made  to reach the ordering provider at this time regarding impression #3 IMPRESSION: 1. Significantly motion degraded examination, limiting evaluation. 2. The diffusion-weighted imaging is of good quality and there is no evidence of an acute infarct. 3. Dural thickening versus subdural collections/possible subdural hematomas overlying the bilateral cerebral hemispheres, more prominent on the left and measuring up to 4 mm in thickness. No significant mass effect. 4. Parenchymal  atrophy, chronic small vessel ischemic disease and chronic infarcts, as outlined within the body of the report. 5. 16 mm right maxillary sinus mucous retention cyst. Electronically Signed: By: Jackey Loge D.O. On: 09/09/2023 12:11   DG Chest Port 1 View Result Date: 09/08/2023 CLINICAL DATA:  Dyspnea EXAM: PORTABLE CHEST 1 VIEW COMPARISON:  09/07/2023 FINDINGS: Cardiac shadow is stable. Distended esophagus with air is noted over the right chest. Bibasilar infiltrative changes are noted right greater than left increased from the previous day. No bony abnormality is noted. IMPRESSION: Increasing right-sided infiltrate Electronically Signed   By: Alcide Clever M.D.   On: 09/08/2023 17:07   ECHOCARDIOGRAM COMPLETE Result Date: 09/07/2023    ECHOCARDIOGRAM REPORT   Patient Name:   KLAIR LEISING Date of Exam: 09/07/2023 Medical Rec #:  [id number]  Height:       66.0 in Accession #:    [id number] Weight:       160.1 lb Date of Birth:  1961/07/14 BSA:          1.819 m Patient Age:    62 years   BP:           91/46 mmHg Patient Gender: F          HR:           105 bpm. Exam Location:  [facility] Procedure: 2D Echo, Cardiac Doppler and Color Doppler STAT ECHO Indications:     Shock R57.9  History:         Patient has no prior history of Echocardiogram examinations.                  Stroke; Risk Factors:Diabetes.  Sonographer:     Cristela Blue Referring Phys:  [id number] KHABIB DGAYLI Diagnosing Phys: Yvonne Kendall MD  Sonographer Comments: Image acquisition challenging due to uncooperative patient. IMPRESSIONS  1. Left ventricular ejection fraction, by estimation, is 70 to 75%. The left ventricle has hyperdynamic function. Left ventricular endocardial border not optimally defined to evaluate regional wall motion. There is mild left ventricular hypertrophy. Left ventricular diastolic parameters were normal.  2. Right ventricular systolic function is normal. The right ventricular size is normal. Mildly increased right ventricular wall  thickness. Tricuspid regurgitation signal is inadequate for assessing PA pressure.  3. The mitral valve is grossly normal. Trivial mitral valve regurgitation. No evidence of mitral stenosis.  4. The aortic valve was not well visualized. Aortic valve regurgitation is not visualized. No aortic stenosis is present.  5. The inferior vena cava is normal in size with greater than 50% respiratory variability, suggesting right atrial pressure of 3 mmHg. FINDINGS  Left Ventricle: Left ventricular ejection fraction, by estimation, is 70 to 75%. The left ventricle has hyperdynamic function. Left ventricular endocardial border not optimally defined to evaluate regional wall motion. The left ventricular internal cavity size was normal in size. There is mild left ventricular hypertrophy. Left ventricular diastolic parameters were normal. Right Ventricle: The right ventricular size is normal. Mildly increased right ventricular wall thickness. Right ventricular systolic function is normal. Tricuspid regurgitation signal is inadequate for assessing PA  pressure. Left Atrium: Left atrial size was normal in size. Right Atrium: Right atrial size was normal in size. Pericardium: Trivial pericardial effusion is present. Mitral Valve: The mitral valve is grossly normal. Trivial mitral valve regurgitation. No evidence of mitral valve stenosis. MV peak gradient, 3.7 mmHg. The mean mitral valve gradient is 2.0 mmHg. Tricuspid Valve: The tricuspid valve is not well visualized. Tricuspid valve regurgitation is trivial. Aortic Valve: The aortic valve was not well visualized. Aortic valve regurgitation is not visualized. No aortic stenosis is present. Aortic valve mean gradient measures 6.0 mmHg. Aortic valve peak gradient measures 9.4 mmHg. Aortic valve area, by VTI measures 3.37 cm. Pulmonic Valve: The pulmonic valve was not well visualized. Pulmonic valve regurgitation is not visualized. No evidence of pulmonic stenosis. Aorta: The aortic  root is normal in size and structure. Pulmonary Artery: The pulmonary artery is not well seen. Venous: The inferior vena cava is normal in size with greater than 50% respiratory variability, suggesting right atrial pressure of 3 mmHg. IAS/Shunts: The interatrial septum was not well visualized.  LEFT VENTRICLE PLAX 2D LVIDd:         3.30 cm   Diastology LVIDs:         1.60 cm   LV e' medial:    9.14 cm/s LV PW:         1.10 cm   LV E/e' medial:  9.4 LV IVS:        1.30 cm   LV e' lateral:   10.60 cm/s LVOT diam:     1.90 cm   LV E/e' lateral: 8.1 LV SV:         89 LV SV Index:   49 LVOT Area:     2.84 cm  RIGHT VENTRICLE RV Basal diam:  3.20 cm RV Mid diam:    3.00 cm RV S prime:     19.80 cm/s TAPSE (M-mode): 2.0 cm LEFT ATRIUM           Index        RIGHT ATRIUM           Index LA diam:      2.00 cm 1.10 cm/m   RA Area:     13.20 cm LA Vol (A2C): 41.8 ml 22.98 ml/m  RA Volume:   31.70 ml  17.43 ml/m LA Vol (A4C): 13.6 ml 7.48 ml/m  AORTIC VALVE AV Area (Vmax):    3.00 cm AV Area (Vmean):   2.91 cm AV Area (VTI):     3.37 cm AV Vmax:           153.00 cm/s AV Vmean:          113.000 cm/s AV VTI:            0.263 m AV Peak Grad:      9.4 mmHg AV Mean Grad:      6.0 mmHg LVOT Vmax:         162.00 cm/s LVOT Vmean:        116.000 cm/s LVOT VTI:          0.313 m LVOT/AV VTI ratio: 1.19  AORTA Ao Root diam: 3.10 cm MITRAL VALVE MV Area (PHT): 2.74 cm    SHUNTS MV Area VTI:   5.29 cm    Systemic VTI:  0.31 m MV Peak grad:  3.7 mmHg    Systemic Diam: 1.90 cm MV Mean grad:  2.0 mmHg MV Vmax:       0.97 m/s MV  Vmean:      64.1 cm/s MV Decel Time: 277 msec MV E velocity: 85.90 cm/s MV A velocity: 73.40 cm/s MV E/A ratio:  1.17 Yvonne Kendall MD Electronically signed by Yvonne Kendall MD Signature Date/Time: 09/07/2023/2:43:11 PM    Final    DG Chest Port 1 View Result Date: 09/07/2023 CLINICAL DATA:  [id number] with shortness of breath. EXAM: PORTABLE CHEST 1 VIEW COMPARISON:  Chest CT without contrast 09/04/2023  FINDINGS: 5:27 a.m. There is increased patchy airspace infiltrate in the right upper and lower lung fields consistent with pneumonia or aspiration. The lungs are hypoinflated and otherwise clear. The mediastinum is unchanged. Chronic dilatation and tortuosity of the esophagus again exaggerates the right paratracheal soft tissue stripe. There is mild aortic atherosclerosis.  The cardiac size is normal. No pleural effusion is seen.  No new osseous findings. IMPRESSION: 1. Increased patchy airspace infiltrate in the right upper and lower lung fields consistent with pneumonia or aspiration. 2. Hypoinflation. 3. Chronic dilatation and tortuosity of the esophagus. 4. Aortic atherosclerosis. Electronically Signed   By: Almira Bar M.D.   On: 09/07/2023 06:12   US SOFT TISSUE HEAD & NECK (NON-THYROID) Result Date: 09/04/2023 CLINICAL DATA:  Left parotid gland swelling. EXAM: ULTRASOUND OF HEAD/NECK SOFT TISSUES TECHNIQUE: Ultrasound examination of the head and neck soft tissues was performed in the area of clinical concern. COMPARISON:  Cervical spine CT 09/03/2022 FINDINGS: Targeted ultrasound was performed over the area of concern within the left parotid gland. No underlying mass or focal fluid collection identified. The parotid gland appears heterogeneous and enlarged. IMPRESSION: 1. No underlying mass or focal fluid collection identified. 2. The left parotid gland appears heterogeneous and enlarged. This is nonspecific but may reflect parotiditis. Electronically Signed   By: Signa Kell M.D.   On: 09/04/2023 15:57   CT T-SPINE NO CHARGE Result Date: 09/04/2023 CLINICAL DATA:  Ground level fall. Hypoxia and tachycardia. Concern for traumatic injury to the thoracic area. EXAM: CT Thoracic and Lumbar spine without contrast TECHNIQUE: Multiplanar CT images of the thoracic and lumbar spine were reconstructed from contemporary CT of the Chest, Abdomen, and Pelvis. RADIATION DOSE REDUCTION: This exam was performed  according to the departmental dose-optimization program which includes automated exposure control, adjustment of the mA and/or kV according to patient size and/or use of iterative reconstruction technique. CONTRAST:  No additional COMPARISON:  Chest CT 04/07/2019.  Abdominopelvic CT 06/25/2017. FINDINGS: CT THORACIC SPINE FINDINGS Alignment: Normal. Vertebrae: Compared with the previous chest CT, there is new endplate sclerosis in the superior endplate of T3 consistent with an age indeterminate fracture, potentially acute. Less than 10% loss of vertebral body height and no osseous retropulsion. No other evidence of acute thoracic spine injury. Paraspinal and other soft tissues: No acute paraspinal findings are identified. Chest CT findings dictated separately. Disc levels: Mild multilevel spondylosis with disc space narrowing and endplate osteophytes. No evidence of large disc herniation or high-grade spinal stenosis. CT LUMBAR SPINE FINDINGS Segmentation: There are 5 lumbar type vertebral bodies. Alignment: Normal. Vertebrae: The bones appear mildly demineralized. There is a new superior endplate Schmorl's node at L5 compared with previous abdominal CT. This demonstrates no definite acute features. No evidence of acute lumbar spine fracture or traumatic subluxation. Paraspinal and other soft tissues: No acute paraspinal findings. Abdominopelvic CT findings dictated separately. Disc levels: Mild multilevel spondylosis with mild disc space narrowing and endplate osteophyte formation inferiorly. Moderate to advanced facet disease from L3-4 through L5-S1 contributing to mild foraminal narrowing. IMPRESSION:  1. Age indeterminate fracture of the superior endplate of T3, potentially acute. Less than 10% loss of vertebral body height and no osseous retropulsion. 2. No other evidence of acute thoracic or lumbar spine injury. 3. Mild multilevel spondylosis. 4. Chest, abdomen and pelvic CT findings dictated separately.  Electronically Signed   By: Carey Bullocks M.D.   On: 09/04/2023 12:59   CT L-SPINE NO CHARGE Result Date: 09/04/2023 CLINICAL DATA:  Ground level fall. Hypoxia and tachycardia. Concern for traumatic injury to the thoracic area. EXAM: CT Thoracic and Lumbar spine without contrast TECHNIQUE: Multiplanar CT images of the thoracic and lumbar spine were reconstructed from contemporary CT of the Chest, Abdomen, and Pelvis. RADIATION DOSE REDUCTION: This exam was performed according to the departmental dose-optimization program which includes automated exposure control, adjustment of the mA and/or kV according to patient size and/or use of iterative reconstruction technique. CONTRAST:  No additional COMPARISON:  Chest CT 04/07/2019.  Abdominopelvic CT 06/25/2017. FINDINGS: CT THORACIC SPINE FINDINGS Alignment: Normal. Vertebrae: Compared with the previous chest CT, there is new endplate sclerosis in the superior endplate of T3 consistent with an age indeterminate fracture, potentially acute. Less than 10% loss of vertebral body height and no osseous retropulsion. No other evidence of acute thoracic spine injury. Paraspinal and other soft tissues: No acute paraspinal findings are identified. Chest CT findings dictated separately. Disc levels: Mild multilevel spondylosis with disc space narrowing and endplate osteophytes. No evidence of large disc herniation or high-grade spinal stenosis. CT LUMBAR SPINE FINDINGS Segmentation: There are 5 lumbar type vertebral bodies. Alignment: Normal. Vertebrae: The bones appear mildly demineralized. There is a new superior endplate Schmorl's node at L5 compared with previous abdominal CT. This demonstrates no definite acute features. No evidence of acute lumbar spine fracture or traumatic subluxation. Paraspinal and other soft tissues: No acute paraspinal findings. Abdominopelvic CT findings dictated separately. Disc levels: Mild multilevel spondylosis with mild disc space narrowing  and endplate osteophyte formation inferiorly. Moderate to advanced facet disease from L3-4 through L5-S1 contributing to mild foraminal narrowing. IMPRESSION: 1. Age indeterminate fracture of the superior endplate of T3, potentially acute. Less than 10% loss of vertebral body height and no osseous retropulsion. 2. No other evidence of acute thoracic or lumbar spine injury. 3. Mild multilevel spondylosis. 4. Chest, abdomen and pelvic CT findings dictated separately. Electronically Signed   By: Carey Bullocks M.D.   On: 09/04/2023 12:59   CT Head Wo Contrast Result Date: 09/04/2023 CLINICAL DATA:  Ground level fall with head injury EXAM: CT HEAD WITHOUT CONTRAST CT CERVICAL SPINE WITHOUT CONTRAST TECHNIQUE: Multidetector CT imaging of the head and cervical spine was performed following the standard protocol without intravenous contrast. Multiplanar CT image reconstructions of the cervical spine were also generated. RADIATION DOSE REDUCTION: This exam was performed according to the departmental dose-optimization program which includes automated exposure control, adjustment of the mA and/or kV according to patient size and/or use of iterative reconstruction technique. COMPARISON:  07/05/2009 FINDINGS: CT HEAD FINDINGS Brain: No evidence of acute infarction, hemorrhage, hydrocephalus, extra-axial collection or mass lesion/mass effect. Chronic infarcts scattered along the left frontoparietal convexity and in the left basal ganglia which are in the MCA distribution. Chronic appearing lacunar infarct at the right thalamus. Vascular: No hyperdense vessel or unexpected calcification. Skull: No evidence of fracture Sinuses/Orbits: No evidence of injury. Retention cyst appearance in the inferior right maxillary sinus. CT CERVICAL SPINE FINDINGS Alignment: No traumatic malalignment Skull base and vertebrae: No acute fracture. Soft tissues and spinal  canal: Asymmetric parotid size, larger on the left where there is mild fat  stranding, favor left parotiditis rather than right parotid atrophy. Disc levels: Multilevel degenerative endplate and facet spurring. No high-grade bony stenosis. Upper chest: Reported separately. IMPRESSION: 1. Expanded left parotid suggesting parotiditis, correlate for regional symptoms. 2. No acute intracranial or cervical spine finding. 3. 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 Electronically Signed   By: Tiburcio Pea M.D.   On: 09/04/2023 12:52   CT Cervical Spine Wo Contrast Result Date: 09/04/2023 CLINICAL DATA:  Ground level fall with head injury EXAM: CT HEAD WITHOUT CONTRAST CT CERVICAL SPINE WITHOUT CONTRAST TECHNIQUE: Multidetector CT imaging of the head and cervical spine was performed following the standard protocol without intravenous contrast. Multiplanar CT image reconstructions of the cervical spine were also generated. RADIATION DOSE REDUCTION: This exam was performed according to the departmental dose-optimization program which includes automated exposure control, adjustment of the mA and/or kV according to patient size and/or use of iterative reconstruction technique. COMPARISON:  07/05/2009 FINDINGS: CT HEAD FINDINGS Brain: No evidence of acute infarction, hemorrhage, hydrocephalus, extra-axial collection or mass lesion/mass effect. Chronic infarcts scattered along the left frontoparietal convexity and in the left basal ganglia which are in the MCA distribution. Chronic appearing lacunar infarct at the right thalamus. Vascular: No hyperdense vessel or unexpected calcification. Skull: No evidence of fracture Sinuses/Orbits: No evidence of injury. Retention cyst appearance in the inferior right maxillary sinus. CT CERVICAL SPINE FINDINGS Alignment: No traumatic malalignment Skull base and vertebrae: No acute fracture. Soft tissues and spinal canal: Asymmetric parotid size, larger on the left where there is mild fat stranding, favor left parotiditis  rather than right parotid atrophy. Disc levels: Multilevel degenerative endplate and facet spurring. No high-grade bony stenosis. Upper chest: Reported separately. IMPRESSION: 1. Expanded left parotid suggesting parotiditis, correlate for regional symptoms. 2. No acute intracranial or cervical spine finding. 3. 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 Electronically Signed   By: Tiburcio Pea M.D.   On: 09/04/2023 12:52   CT CHEST ABDOMEN PELVIS WO CONTRAST Result Date: 09/04/2023 CLINICAL DATA:  Ground level fall.  Hypoxia and tachycardia. EXAM: CT CHEST, ABDOMEN AND PELVIS WITHOUT CONTRAST TECHNIQUE: Multidetector CT imaging of the chest, abdomen and pelvis was performed following the standard protocol without IV contrast. RADIATION DOSE REDUCTION: This exam was performed according to the departmental dose-optimization program which includes automated exposure control, adjustment of the mA and/or kV according to patient size and/or use of iterative reconstruction technique. COMPARISON:  Chest radiograph same date. Abdominopelvic CT 06/25/2017. Chest CT 04/07/2019. Esophagram 12/26/2019. FINDINGS: CT CHEST FINDINGS Cardiovascular: Atherosclerosis of the aorta, great vessels and coronary arteries. The heart size is normal. There is no pericardial effusion. Mediastinum/Nodes: There are no enlarged mediastinal, hilar or axillary lymph nodes. Chronic dilatation of the thoracic esophagus is again noted. The esophagus is diffusely fluid-filled. The thyroid gland and trachea appear unremarkable. Lungs/Pleura: No pleural effusion or pneumothorax. Azygous fissure noted. There is mild biapical scarring. There is increased central airway thickening and patchy airspace opacities at both lung bases, favoring atelectasis. No consolidation. Musculoskeletal/Chest wall: No chest wall mass or aggressive osseous lesions. There is new superior endplate sclerosis at T3 consistent with a mild  age-indeterminate compression deformity. Additional spine details dictated separately. CT ABDOMEN AND PELVIS FINDINGS Hepatobiliary: Heterogeneous low density throughout the liver most consistent with heterogeneous hepatic steatosis. No focally suspicious lesions identified on noncontrast imaging. No evidence of gallstones,  gallbladder wall thickening or biliary dilatation. Pancreas: Unremarkable. No pancreatic ductal dilatation or surrounding inflammatory changes. Spleen: Normal in size without focal abnormality. Adrenals/Urinary Tract: The adrenal glands are unchanged with low-density thickening bilaterally consistent with chronic hyperplasia. No specific follow-up imaging recommended. There are punctate nonobstructing bilateral renal calculi. No evidence of ureteral calculus or hydronephrosis. No suspicious renal lesion. The bladder appears normal for its degree of distention. Stomach/Bowel: No enteric contrast administered. The stomach appears unremarkable for its degree of distension. No evidence of bowel wall thickening, distention or surrounding inflammatory change. Mildly prominent stool throughout the colon. Vascular/Lymphatic: There are no enlarged abdominal or pelvic lymph nodes. Mild aortic and branch vessel atherosclerosis without evidence of aneurysm. Reproductive: Status post hysterectomy.  No adnexal mass. Other: No evidence of abdominal wall mass or hernia. No ascites or pneumoperitoneum. Musculoskeletal: Interval Schmorl's node involving the superior endplate of L5. Spine details dictated separately. No definite acute osseous findings. Chronic sacroiliac degenerative changes bilaterally. IMPRESSION: 1. No definite acute traumatic findings identified in the chest, abdomen or pelvis. 2. New superior endplate sclerosis at T3 consistent with a mild age-indeterminate compression deformity. Thoracolumbar spine details dictated separately. 3. 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 Correlate clinically. 4. Increased central airway thickening and patchy airspace opacities at both lung bases, favoring atelectasis. 5. Heterogeneous hepatic steatosis. 6. Punctate nonobstructing bilateral renal calculi. 7.  Aortic Atherosclerosis (ICD10-I70.0). Electronically Signed   By: Carey Bullocks M.D.   On: 09/04/2023 12:50   DG Chest Port 1 View Result Date: 09/04/2023 CLINICAL DATA:  Questionable sepsis. EXAM: PORTABLE CHEST 1 VIEW COMPARISON:  05/26/2019 FINDINGS: Heart size is normal. No pleural fluid, interstitial edema, or airspace consolidation. The visualized osseous structures are unremarkable. IMPRESSION: 1. No acute findings. Electronically Signed   By: Signa Kell M.D.   On: 09/04/2023 09:51    Microbiology: Results for orders placed or performed during the hospital encounter of 09/04/23  Blood Culture (routine x 2)     Status: None   Collection Time: 09/04/23 10:01 AM   Specimen: BLOOD  Result Value Ref Range Status   Specimen Description BLOOD BLOOD RIGHT FOREARM  Final   Special Requests   Final    BOTTLES DRAWN AEROBIC AND ANAEROBIC Blood Culture adequate volume   Culture   Final    NO GROWTH 5 DAYS Performed at [facility], [address] [place], [place] [address]    Report Status 09/09/2023 FINAL  Final  Resp panel by RT-PCR (RSV, Flu A&B, Covid) Anterior Nasal Swab     Status: None   Collection Time: 09/04/23 10:01 AM   Specimen: Anterior Nasal Swab  Result Value Ref Range Status   SARS Coronavirus 2 by RT PCR NEGATIVE NEGATIVE Final    Comment: (NOTE) SARS-CoV-2 target nucleic acids are NOT DETECTED.  The SARS-CoV-2 RNA is generally detectable in upper respiratory specimens during the acute phase of infection. The lowest concentration of SARS-CoV-2 viral copies this assay can detect is 138 copies/mL. A negative result does not preclude SARS-Cov-2 infection and should not be used as the sole basis for treatment or other  patient management decisions. A negative result may occur with  improper specimen collection/handling, submission of specimen other than nasopharyngeal swab, presence of viral mutation(s) within the areas targeted by this assay, and inadequate number of viral copies(<138 copies/mL). A negative result must be combined with clinical observations, patient history, and epidemiological information. The expected result is Negative.  Fact Sheet for  Patients:  BloggerCourse.com  Fact Sheet for Healthcare Providers:  SeriousBroker.it  This test is no t yet approved or cleared by the [place] FDA and  has been authorized for detection and/or diagnosis of SARS-CoV-2 by FDA under an Emergency Use Authorization (EUA). This EUA will remain  in effect (meaning this test can be used) for the duration of the COVID-19 declaration under Section 564(b)(1) of the Act, 21 U.S.C.section 360bbb-3(b)(1), unless the authorization is terminated  or revoked sooner.       Influenza A by PCR NEGATIVE NEGATIVE Final   Influenza B by PCR NEGATIVE NEGATIVE Final    Comment: (NOTE) The Xpert Xpress SARS-CoV-2/FLU/RSV plus assay is intended as an aid in the diagnosis of influenza from Nasopharyngeal swab specimens and should not be used as a sole basis for treatment. Nasal washings and aspirates are unacceptable for Xpert Xpress SARS-CoV-2/FLU/RSV testing.  Fact Sheet for Patients: BloggerCourse.com  Fact Sheet for Healthcare Providers: SeriousBroker.it  This test is not yet approved or cleared by the [place] FDA and has been authorized for detection and/or diagnosis of SARS-CoV-2 by FDA under an Emergency Use Authorization (EUA). This EUA will remain in effect (meaning this test can be used) for the duration of the COVID-19 declaration under Section 564(b)(1) of the Act, 21 U.S.C. section  360bbb-3(b)(1), unless the authorization is terminated or revoked.     Resp Syncytial Virus by PCR NEGATIVE NEGATIVE Final    Comment: (NOTE) Fact Sheet for Patients: BloggerCourse.com  Fact Sheet for Healthcare Providers: SeriousBroker.it  This test is not yet approved or cleared by the [place] FDA and has been authorized for detection and/or diagnosis of SARS-CoV-2 by FDA under an Emergency Use Authorization (EUA). This EUA will remain in effect (meaning this test can be used) for the duration of the COVID-19 declaration under Section 564(b)(1) of the Act, 21 U.S.C. section 360bbb-3(b)(1), unless the authorization is terminated or revoked.  Performed at [facility], [address] [place], [place] [address]   Blood Culture (routine x 2)     Status: None   Collection Time: 09/04/23 10:41 AM   Specimen: BLOOD  Result Value Ref Range Status   Specimen Description BLOOD BLOOD LEFT WRIST  Final   Special Requests   Final    BOTTLES DRAWN AEROBIC AND ANAEROBIC Blood Culture adequate volume   Culture   Final    NO GROWTH 5 DAYS Performed at [facility], [address] [place], [place] [address]    Report Status 09/09/2023 FINAL  Final  Respiratory (~20 pathogens) panel by PCR     Status: None   Collection Time: 09/04/23  2:47 PM   Specimen: Nasopharyngeal Swab; Respiratory  Result Value Ref Range Status   Adenovirus NOT DETECTED NOT DETECTED Final   Coronavirus 229E NOT DETECTED NOT DETECTED Final    Comment: (NOTE) The Coronavirus on the Respiratory Panel, DOES NOT test for the novel  Coronavirus (2019 nCoV)    Coronavirus HKU1 NOT DETECTED NOT DETECTED Final   Coronavirus NL63 NOT DETECTED NOT DETECTED Final   Coronavirus OC43 NOT DETECTED NOT DETECTED Final   Metapneumovirus NOT DETECTED NOT DETECTED Final   Rhinovirus / Enterovirus NOT DETECTED NOT DETECTED Final   Influenza A NOT DETECTED  NOT DETECTED Final   Influenza B NOT DETECTED NOT DETECTED Final   Parainfluenza Virus 1 NOT DETECTED NOT DETECTED Final   Parainfluenza Virus 2 NOT DETECTED NOT DETECTED Final   Parainfluenza Virus 3 NOT DETECTED  NOT DETECTED Final   Parainfluenza Virus 4 NOT DETECTED NOT DETECTED Final   Respiratory Syncytial Virus NOT DETECTED NOT DETECTED Final   Bordetella pertussis NOT DETECTED NOT DETECTED Final   Bordetella Parapertussis NOT DETECTED NOT DETECTED Final   Chlamydophila pneumoniae NOT DETECTED NOT DETECTED Final   Mycoplasma pneumoniae NOT DETECTED NOT DETECTED Final    Comment: Performed at [facility], [address], [place], [place] [address]  Urine Culture     Status: Abnormal   Collection Time: 09/04/23  6:00 PM   Specimen: Urine, Random  Result Value Ref Range Status   Specimen Description   Final    URINE, RANDOM Performed at [facility], [address] [place], [place] [address]    Special Requests   Final    NONE Reflexed from [id number] Performed at [facility], [address] [place], [place] [address]    Culture (A)  Final    <10,000 COLONIES/mL INSIGNIFICANT GROWTH Performed at [facility], [address] [place], [place] [address]    Report Status 09/06/2023 FINAL  Final  MRSA Next Gen by PCR, Nasal     Status: None   Collection Time: 09/06/23  8:33 AM   Specimen: Nasal Mucosa; Nasal Swab  Result Value Ref Range Status   MRSA by PCR Next Gen NOT DETECTED NOT DETECTED Final    Comment: (NOTE) The GeneXpert MRSA Assay (FDA approved for NASAL specimens only), is one component of a comprehensive MRSA colonization surveillance program. It is not intended to diagnose MRSA infection nor to guide or monitor treatment for MRSA infections. Test performance is not FDA approved in patients less than 61 years old. Performed at [facility], [address] [place], [place] [address]   MRSA Next Gen by PCR, Nasal      Status: None   Collection Time: 09/08/23  4:20 PM   Specimen: Nasal Mucosa; Nasal Swab  Result Value Ref Range Status   MRSA by PCR Next Gen NOT DETECTED NOT DETECTED Final    Comment: (NOTE) The GeneXpert MRSA Assay (FDA approved for NASAL specimens only), is one component of a comprehensive MRSA colonization surveillance program. It is not intended to diagnose MRSA infection nor to guide or monitor treatment for MRSA infections. Test performance is not FDA approved in patients less than 64 years old. Performed at [facility], [address] [place], [place] [address]   Body fluid culture w Gram Stain     Status: None   Collection Time: 09/16/23  2:20 PM   Specimen: PATH Cytology Pleural fluid  Result Value Ref Range Status   Specimen Description   Final    FLUID Performed at [facility], [address], [place], [place] [address]    Special Requests   Final    CYTO PLEU Performed at [facility], [address] [place], [place] [address]    Gram Stain   Final    RARE WBC PRESENT, PREDOMINANTLY MONONUCLEAR NO ORGANISMS SEEN    Culture   Final    NO GROWTH 3 DAYS Performed at [facility], [address], [place], [place] [address]    Report Status 09/20/2023 FINAL  Final    Labs: CBC: Recent Labs  Lab 09/25/23 0516 09/26/23 0525 09/27/23 0525 09/28/23 0506 09/30/23 0618  WBC 8.3 8.4 8.7 7.8 8.3  NEUTROABS  --   --   --   --  5.5  HGB  8.5* 8.6* 9.2* 8.6* 9.0*  HCT 25.9* 25.8* 28.3* 26.5* 27.3*  MCV 95.9 93.5 95.0 96.7 94.5  PLT 298 302 335 299 302   Basic Metabolic Panel: Recent Labs  Lab 09/25/23 0516 09/26/23 0525 09/27/23 0525 09/28/23 0506 09/30/23 0618  NA 137 135 139 137 138  K 4.6 4.2 4.5 4.6 4.5  CL 108 105 107 107 104  CO2 21* 23 24 23 25   GLUCOSE 125* 125* 99 105* 110*  BUN 40* 28* 23 28* 37*  CREATININE 0.63 0.65 0.61 0.56 0.57  CALCIUM 8.1* 8.0* 8.5* 8.4* 8.8*   Liver Function Tests: No results  for input(s): "AST", "ALT", "ALKPHOS", "BILITOT", "PROT", "ALBUMIN" in the last 168 hours. CBG: Recent Labs  Lab 09/29/23 1257 09/29/23 1757 09/29/23 2334 09/30/23 0547 09/30/23 1128  GLUCAP 116* 145* 109* 117* 134*    Discharge time spent: greater than 30 minutes.  Signed: Jonah Blue, MD [facility] 09/30/2023

## 2023-09-30 NOTE — Progress Notes (Addendum)
Called [facility] thrice  ([phone], and [phone]) to give report with no success. Left phone number with Sherron Flemings at front desk .   Called again to give report at [phone] with no success.

## 2023-11-02 ENCOUNTER — Encounter: Payer: Self-pay | Admitting: Gastroenterology

## 2023-11-15 ENCOUNTER — Other Ambulatory Visit: Payer: Self-pay

## 2023-11-15 ENCOUNTER — Telehealth: Payer: Self-pay

## 2023-11-15 DIAGNOSIS — K9429 Other complications of gastrostomy: Secondary | ICD-10-CM | POA: Insufficient documentation

## 2023-11-15 DIAGNOSIS — E860 Dehydration: Secondary | ICD-10-CM | POA: Diagnosis not present

## 2023-11-15 LAB — CBC
HCT: 36.9 % (ref 36.0–46.0)
Hemoglobin: 11.9 g/dL — ABNORMAL LOW (ref 12.0–15.0)
MCH: 32.1 pg (ref 26.0–34.0)
MCHC: 32.2 g/dL (ref 30.0–36.0)
MCV: 99.5 fL (ref 80.0–100.0)
Platelets: 238 K/uL (ref 150–400)
RBC: 3.71 MIL/uL — ABNORMAL LOW (ref 3.87–5.11)
RDW: 14.2 % (ref 11.5–15.5)
WBC: 7.8 K/uL (ref 4.0–10.5)
nRBC: 0 % (ref 0.0–0.2)

## 2023-11-15 LAB — BASIC METABOLIC PANEL WITH GFR
Anion gap: 13 (ref 5–15)
BUN: 33 mg/dL — ABNORMAL HIGH (ref 8–23)
CO2: 25 mmol/L (ref 22–32)
Calcium: 9.9 mg/dL (ref 8.9–10.3)
Chloride: 104 mmol/L (ref 98–111)
Creatinine, Ser: 0.84 mg/dL (ref 0.44–1.00)
GFR, Estimated: >60 mL/min (ref >60)
Glucose, Bld: 89 mg/dL (ref 70–99)
Potassium: 3.9 mmol/L (ref 3.5–5.1)
Sodium: 142 mmol/L (ref 135–145)

## 2023-11-15 LAB — CBG MONITORING, ED: Glucose-Capillary: 74 mg/dL (ref 70–99)

## 2023-11-15 NOTE — Transitions of Care (Post Inpatient/ED Visit) (Signed)
   11/15/2023  Name: Kaitlyn Hodges MRN: [id number] DOB: [birth year]  Today's TOC FU Call Status: Today's TOC FU Call Status:: Successful TOC FU Call Completed TOC FU Call Complete Date: 11/15/23 Patient's Name and Date of Birth confirmed.  Transition Care Management Follow-up Telephone Call Date of Discharge: 11/13/23 Discharge Facility: Other [facility]) Name of Other (Non[facility]) Discharge Facility: [facility] Type of Discharge: Inpatient Admission Primary Inpatient Discharge Diagnosis:: malnutrition How have you been since you were released from the hospital?: Better Any questions or concerns?: No  Items Reviewed: Did you receive and understand the discharge instructions provided?: Yes Medications obtained,verified, and reconciled?: Yes (Medications Reviewed) Any new allergies since your discharge?: No Dietary orders reviewed?: Yes Do you have support at home?: Yes People in Home: child(ren), adult  Medications Reviewed Today: Medications Reviewed Today     Reviewed by Karena Addison, LPN (Licensed Practical Nurse) on 11/15/23 at 1239  Med List Status: <None>   Medication Order Taking? Sig Documenting Provider Last Dose Status Informant  acetaminophen (TYLENOL) 325 MG tablet [id number]  Place 2 tablets (650 mg total) into feeding tube every 6 (six) hours as needed for mild pain (pain score 1-3) (or Fever >/= 101). Jonah Blue, MD  Active   cyanocobalamin 1000 MCG tablet [id number]  Place 1 tablet (1,000 mcg total) into feeding tube daily. Jonah Blue, MD  Active   fiber [facility] FIBER) PACK packet [id number]  Place 1 packet into feeding tube 3 (three) times daily. Jonah Blue, MD  Active   liver oil-zinc oxide (DESITIN) 40 % ointment [id number]  Apply topically as needed for irritation. Jonah Blue, MD  Active   nutrition supplement, Alphia Moh) PACK [id number]  Place 1 packet into feeding tube 2 (two) times daily between meals. Jonah Blue, MD  Active    Nutritional Supplements (FEEDING SUPPLEMENT, OSMOLITE 1.5 CAL,) LIQD [id number]  Place 1,000 mLs into feeding tube continuous. Jonah Blue, MD  Active   pantoprazole (PROTONIX) 40 MG tablet [id number]  Take 1 tablet (40 mg total) by mouth daily. Per tube Jonah Blue, MD  Active   Water For Irrigation, Sterile (FREE WATER) SOLN [id number]  Place 200 mLs into feeding tube every 6 (six) hours. Jonah Blue, MD  Active             Home Care and Equipment/Supplies: Were Home Health Services Ordered?: Yes Name of Home Health Agency:: [facility] Has Agency set up a time to come to your home?: No Any new equipment or medical supplies ordered?: NA  Functional Questionnaire: Do you need assistance with bathing/showering or dressing?: Yes Do you need assistance with meal preparation?: Yes Do you need assistance with eating?: No Do you have difficulty maintaining continence: No Do you need assistance with getting out of bed/getting out of a chair/moving?: No Do you have difficulty managing or taking your medications?: Yes  Follow up appointments reviewed: PCP Follow-up appointment confirmed?: Yes Date of PCP follow-up appointment?: 11/24/23 Follow-up Provider: [facility] Follow-up appointment confirmed?: NA Do you need transportation to your follow-up appointment?: No Do you understand care options if your condition(s) worsen?: Yes-patient verbalized understanding    SIGNATURE Karena Addison, LPN [facility] Nurse Health Advisor Direct Dial [phone]

## 2023-11-15 NOTE — ED Triage Notes (Addendum)
 Pt arrived from home BIB [facility] for nutrient definency since Friday, reports was d/c from hospital recently with a newly placed G-tube but has not had "anything in my tube since Friday". Only had sips of water. Reports ran out of the TPN container to hook up for internal feeding. Provider advised to come here for nutrient replacement. Daughter was able to get one bottle PTA, but pt has not hooked it up. Denies any pain, feels a little weak, but not experiencing any pain, SOB, n/v or other aliments. VS, A&Ox4, NAD noted.   Daughter reports has the pump and feeding with them, they are not sure to work the pump or how to get the hook up to the Gtube, was advised to come to the ED for further teaching   CBG 74  Pt has a MOST FORM

## 2023-11-15 NOTE — ED Triage Notes (Signed)
 EMS  brings pt in from home; just d/c Friday from rehab; reporting complications with g-tube, unable to receive feedings

## 2023-11-16 ENCOUNTER — Emergency Department
Admission: EM | Admit: 2023-11-16 | Discharge: 2023-11-16 | Disposition: A | Discharge status: Home / Self Care | Attending: Emergency Medicine | Admitting: Emergency Medicine

## 2023-11-16 DIAGNOSIS — R6889 Other general symptoms and signs: Secondary | ICD-10-CM

## 2023-11-16 DIAGNOSIS — E860 Dehydration: Secondary | ICD-10-CM

## 2023-11-16 NOTE — ED Provider Notes (Signed)
 [facility] Provider Note    Event Date/Time   First MD Initiated Contact with Patient 11/16/23 0103     (approximate)   History   Nutrient Definency    HPI  Shantell Faddis is a 63 y.o. female   She had a recent G-tube placed and is having difficulty operating at home and has not had a proper G-tube feed for the past 3-4 days.  He is not supposed to be taking anything by mouth but in the setting of dysfunctional G-tube she has been sipping Pedialyte.  She feels dehydrated.  She has no abdominal pain.  No fevers no chills.  She is here with her daughter   Independent Historian contributed to assessment above: Daughter gives information past medical history as above    Physical Exam   Triage Vital Signs: ED Triage Vitals  Encounter Vitals Group     BP 11/15/23 2105 (!) 121/91     Systolic BP Percentile --      Diastolic BP Percentile --      Pulse Rate 11/15/23 2105 96     Resp 11/15/23 2105 16     Temp 11/15/23 2105 97.7 F (36.5 C)     Temp Source 11/15/23 2105 Oral     SpO2 11/15/23 2105 100 %     Weight 11/15/23 2106 145 lb (65.8 kg)     Height 11/15/23 2106 5\' 6"  (1.676 m)     Head Circumference --      Peak Flow --      Pain Score 11/15/23 2105 0     Pain Loc --      Pain Education --      Exclude from Growth Chart --     Most recent vital signs: Vitals:   11/15/23 2105 11/16/23 0053  BP: (!) 121/91 (!) 128/91  Pulse: 96 90  Resp: 16 17  Temp: 97.7 F (36.5 C) 98 F (36.7 C)  SpO2: 100% 100%    General: Awake, no distress.  CV:  Good peripheral perfusion.  Resp:  Normal effort.  Abd:  No distention.  Other:  Well-appearing patient.  Slightly dry mucous membranes looks mildly dehydrated.  The insertion sites of the G-tube looks clean dry and intact with no frank infectious changes.  She has a soft benign abdominal exam.  She is pleasant cooperative conversant with normal vital signs and afebrile.   ED Results / Procedures /  Treatments   Labs (all labs ordered are listed, but only abnormal results are displayed) Labs Reviewed  BASIC METABOLIC PANEL - Abnormal; Notable for the following components:      Result Value   BUN 33 (*)    All other components within normal limits  CBC - Abnormal; Notable for the following components:   RBC 3.71 (*)    Hemoglobin 11.9 (*)    All other components within normal limits  URINALYSIS, ROUTINE W REFLEX MICROSCOPIC  CBG MONITORING, ED  CBG MONITORING, ED     I ordered and reviewed the above labs they are notable for cell counts electrolytes glucose unremarkable.   PROCEDURES:  Critical Care performed: No  Procedures   MEDICATIONS ORDERED IN ED: Medications - No data to display  IMPRESSION / MDM / ASSESSMENT AND PLAN / ED COURSE  I reviewed the triage vital signs and the nursing notes.  Patient's presentation is most consistent with acute presentation with potential threat to life or bodily function.  Differential diagnosis includes, but is not limited to, nutritional deficiency, electrolyte disturbance, hypoglycemia, dehydration, kidney dysfunction, due to dysfunctional G-tube   The patient is on the cardiac monitor to evaluate for evidence of arrhythmia and/or significant heart rate changes.  MDM:    They are having trouble operating the G-tube.  Our nurse was able to provide teaching and show how to use the G-tube and connected to feeds, and she is getting a feed now.  Fortunately labs showed no significant lab derangements and although she looks slightly dehydrated I do not think it is severe as she has no renal dysfunction is now getting fed.  She has no other acute medical complaints, no abdominal pain, no signs of infection and thus now that we have corrected her G-tube dysfunction I think she can go home.      FINAL CLINICAL IMPRESSION(S) / ED DIAGNOSES   Final diagnoses:  Complaint associated with gastric  tube (HCC)  Dehydration     Rx / DC Orders   ED Discharge Orders     None        Note:  This document was prepared using Dragon voice recognition software and may include unintentional dictation errors.    Pilar Jarvis, MD 11/16/23 [phone]

## 2023-11-16 NOTE — ED Notes (Signed)
 [facility] called  for  transport home

## 2023-11-16 NOTE — ED Notes (Signed)
 Pending call to patient's insurance carrier for approval to transport

## 2023-11-16 NOTE — ED Notes (Signed)
 This tech brought pt back to room 53 and pt asked tech to change her brief because she was wet. This tech proceeded to change pt's brief and provide her with a new clean brief. Pt was given a warm blanket and pt has no other needs at the moment.

## 2023-11-16 NOTE — Discharge Instructions (Signed)
 Use G-tube and feeds as instructed.  Thank you for choosing [place] for your health care today!  Please see your primary doctor this week for a follow up appointment.   If you have any new, worsening, or unexpected symptoms call your doctor right away or come back to the emergency department for reevaluation.  It was my pleasure to care for you today.   Daneil Dan [place] Charon, MD

## 2023-11-17 ENCOUNTER — Telehealth: Payer: Self-pay

## 2023-11-17 NOTE — Telephone Encounter (Signed)
 I spoke with the patient and let her know that we would need to see her before we could order anything.  She has not been seen since 2022.  Per the patient her daughter is working on trying to get her help.  She is going to contact the facility she was in and see if they can do anything to start some home health help.   Copied from CRM [id number]. Topic: General - Other >> Nov 17, 2023  2:16 PM Abundio Miu S wrote: Reason for CRM: Rosalie Doctor with [facility] calling to inform provider's office that they do not have the availability to accept patient at this time. She states that she has contacted several other facilities on behalf of the patient and availability is limited as well. She states that a major concern is patient's insurance and that many facilities do not accept patient's insurance, [facility] Medicaid. She states that patient is really needing Nursing, PT, and OT. She ask that we provide any suggestions and resources for patient. Patient contact is [name] (daughter). Callback # [phone].

## 2023-11-17 NOTE — Telephone Encounter (Signed)
 Patient was advised that she would need to be seen   Copied from CRM [id number]. Topic: Clinical - Medication Question >> Nov 17, 2023 12:22 PM Yolanda T wrote: Reason for CRM: Patient called stated she is having stomach issues as she was at the ED yesterday and a G-tube was inserted. Patient is asking provider to prescribe Osmolite 33.8 fl oz. Please f/u with patient

## 2023-11-24 ENCOUNTER — Inpatient Hospital Stay: Admitting: Family Medicine

## 2024-01-21 ENCOUNTER — Telehealth: Payer: Self-pay

## 2024-01-21 NOTE — Telephone Encounter (Signed)
 Copied from CRM [id number]. Topic: General - Other >> Jan 21, 2024  9:07 AM Emylou G wrote: Reason for CRM: Ammon Bales W/[employer] [phone] Please do the certification for the death certificate.. Case # [id number] Need to have a ME authorization.. needs to get this done asap!! doing cremation

## 2024-01-21 NOTE — Telephone Encounter (Signed)
 I have not seen this patient for years and do not know anything about the circumstances of her death. I never agreed to to complete the death certificate. There is note from [facility] that the medical examiner was taking care of it. There is nothing about the circumstances of her death

## 2024-01-21 NOTE — Telephone Encounter (Signed)
 Advised. Verbalized understanding. Reports she will further discuss with the family

## 2024-01-30 DEATH — deceased
